# Patient Record
Sex: Female | Born: 1960
Health system: Southern US, Community
[De-identification: ages and names within clinical notes are randomized; demographics above are authoritative.]

## PROBLEM LIST (undated history)

## (undated) DIAGNOSIS — F419 Anxiety disorder, unspecified: Secondary | ICD-10-CM

## (undated) DIAGNOSIS — K219 Gastro-esophageal reflux disease without esophagitis: Secondary | ICD-10-CM

## (undated) DIAGNOSIS — F4010 Social phobia, unspecified: Secondary | ICD-10-CM

## (undated) DIAGNOSIS — K5792 Diverticulitis of intestine, part unspecified, without perforation or abscess without bleeding: Secondary | ICD-10-CM

## (undated) HISTORY — DX: Social phobia, unspecified: F40.10

## (undated) HISTORY — DX: Anxiety disorder, unspecified: F41.9

## (undated) HISTORY — DX: Diverticulitis of intestine, part unspecified, without perforation or abscess without bleeding: K57.92

## (undated) HISTORY — DX: Gastro-esophageal reflux disease without esophagitis: K21.9

## (undated) HISTORY — PX: OTHER SURGICAL HISTORY: SHX169

---

## 1985-10-08 HISTORY — PX: BREAST ENHANCEMENT SURGERY: SHX7

## 1998-06-07 ENCOUNTER — Encounter: Payer: Self-pay | Admitting: Internal Medicine

## 1998-06-07 LAB — CONVERTED CEMR LAB: Pap Smear: NORMAL

## 2002-01-29 ENCOUNTER — Other Ambulatory Visit: Admission: RE | Admit: 2002-01-29 | Discharge: 2002-01-29 | Payer: Self-pay | Admitting: Internal Medicine

## 2002-01-29 ENCOUNTER — Encounter: Payer: Self-pay | Admitting: Internal Medicine

## 2002-01-29 LAB — CONVERTED CEMR LAB: Pap Smear: NORMAL

## 2003-07-09 ENCOUNTER — Other Ambulatory Visit: Admission: RE | Admit: 2003-07-09 | Discharge: 2003-07-09 | Payer: Self-pay | Admitting: Internal Medicine

## 2003-07-09 ENCOUNTER — Encounter: Payer: Self-pay | Admitting: Internal Medicine

## 2004-07-31 ENCOUNTER — Encounter: Payer: Self-pay | Admitting: Internal Medicine

## 2004-09-06 ENCOUNTER — Ambulatory Visit: Payer: Self-pay | Admitting: Internal Medicine

## 2004-09-13 ENCOUNTER — Ambulatory Visit: Payer: Self-pay | Admitting: Internal Medicine

## 2005-02-02 ENCOUNTER — Ambulatory Visit: Payer: Self-pay | Admitting: Family Medicine

## 2005-08-03 ENCOUNTER — Ambulatory Visit: Payer: Self-pay | Admitting: Internal Medicine

## 2005-09-03 ENCOUNTER — Ambulatory Visit: Payer: Self-pay | Admitting: Internal Medicine

## 2005-12-05 ENCOUNTER — Ambulatory Visit: Payer: Self-pay | Admitting: Internal Medicine

## 2005-12-21 ENCOUNTER — Ambulatory Visit: Payer: Self-pay | Admitting: Internal Medicine

## 2006-01-01 ENCOUNTER — Ambulatory Visit: Payer: Self-pay | Admitting: Internal Medicine

## 2006-01-24 ENCOUNTER — Ambulatory Visit: Payer: Self-pay | Admitting: Internal Medicine

## 2006-03-28 ENCOUNTER — Ambulatory Visit: Payer: Self-pay | Admitting: Internal Medicine

## 2006-08-05 ENCOUNTER — Other Ambulatory Visit: Admission: RE | Admit: 2006-08-05 | Discharge: 2006-08-05 | Payer: Self-pay | Admitting: Internal Medicine

## 2006-08-05 ENCOUNTER — Ambulatory Visit: Payer: Self-pay | Admitting: Internal Medicine

## 2006-08-05 LAB — CONVERTED CEMR LAB: Pap Smear: NORMAL

## 2006-09-02 ENCOUNTER — Ambulatory Visit: Payer: Self-pay | Admitting: Internal Medicine

## 2007-01-15 ENCOUNTER — Encounter: Payer: Self-pay | Admitting: Internal Medicine

## 2007-01-15 DIAGNOSIS — J45991 Cough variant asthma: Secondary | ICD-10-CM | POA: Insufficient documentation

## 2007-01-15 DIAGNOSIS — K5730 Diverticulosis of large intestine without perforation or abscess without bleeding: Secondary | ICD-10-CM | POA: Insufficient documentation

## 2007-01-15 DIAGNOSIS — G479 Sleep disorder, unspecified: Secondary | ICD-10-CM | POA: Insufficient documentation

## 2007-01-15 DIAGNOSIS — F411 Generalized anxiety disorder: Secondary | ICD-10-CM | POA: Insufficient documentation

## 2007-01-15 DIAGNOSIS — A6000 Herpesviral infection of urogenital system, unspecified: Secondary | ICD-10-CM | POA: Insufficient documentation

## 2007-01-15 DIAGNOSIS — K219 Gastro-esophageal reflux disease without esophagitis: Secondary | ICD-10-CM | POA: Insufficient documentation

## 2007-02-06 ENCOUNTER — Ambulatory Visit: Payer: Self-pay | Admitting: Internal Medicine

## 2007-02-26 ENCOUNTER — Ambulatory Visit: Payer: Self-pay | Admitting: Internal Medicine

## 2007-03-10 ENCOUNTER — Ambulatory Visit: Payer: Self-pay | Admitting: Internal Medicine

## 2007-04-24 ENCOUNTER — Encounter: Payer: Self-pay | Admitting: Internal Medicine

## 2007-05-09 ENCOUNTER — Telehealth: Payer: Self-pay | Admitting: Internal Medicine

## 2007-05-15 ENCOUNTER — Telehealth (INDEPENDENT_AMBULATORY_CARE_PROVIDER_SITE_OTHER): Payer: Self-pay | Admitting: *Deleted

## 2007-05-19 ENCOUNTER — Ambulatory Visit: Payer: Self-pay | Admitting: Internal Medicine

## 2007-05-19 DIAGNOSIS — R079 Chest pain, unspecified: Secondary | ICD-10-CM | POA: Insufficient documentation

## 2007-05-21 ENCOUNTER — Ambulatory Visit: Payer: Self-pay

## 2007-05-21 ENCOUNTER — Encounter: Payer: Self-pay | Admitting: Internal Medicine

## 2007-05-22 ENCOUNTER — Encounter: Payer: Self-pay | Admitting: Internal Medicine

## 2007-05-25 ENCOUNTER — Emergency Department: Payer: Self-pay | Admitting: Emergency Medicine

## 2007-05-26 ENCOUNTER — Ambulatory Visit: Payer: Self-pay | Admitting: Unknown Physician Specialty

## 2007-05-26 ENCOUNTER — Encounter: Payer: Self-pay | Admitting: Internal Medicine

## 2007-05-26 LAB — HM COLONOSCOPY

## 2007-05-28 ENCOUNTER — Encounter: Payer: Self-pay | Admitting: Internal Medicine

## 2007-06-01 ENCOUNTER — Encounter: Payer: Self-pay | Admitting: Internal Medicine

## 2007-08-07 ENCOUNTER — Telehealth (INDEPENDENT_AMBULATORY_CARE_PROVIDER_SITE_OTHER): Payer: Self-pay | Admitting: *Deleted

## 2007-08-15 ENCOUNTER — Ambulatory Visit: Payer: Self-pay | Admitting: Internal Medicine

## 2007-08-16 ENCOUNTER — Encounter: Payer: Self-pay | Admitting: Internal Medicine

## 2007-08-19 LAB — CONVERTED CEMR LAB
Albumin: 4.2 g/dL (ref 3.5–5.2)
CO2: 25 meq/L (ref 19–32)
Calcium: 8.8 mg/dL (ref 8.4–10.5)
Creatinine, Ser: 0.97 mg/dL (ref 0.40–1.20)
Phosphorus: 4.2 mg/dL (ref 2.3–4.6)
Sodium: 138 meq/L (ref 135–145)

## 2007-08-20 ENCOUNTER — Telehealth: Payer: Self-pay | Admitting: Internal Medicine

## 2007-11-17 ENCOUNTER — Telehealth (INDEPENDENT_AMBULATORY_CARE_PROVIDER_SITE_OTHER): Payer: Self-pay | Admitting: *Deleted

## 2007-12-15 ENCOUNTER — Telehealth (INDEPENDENT_AMBULATORY_CARE_PROVIDER_SITE_OTHER): Payer: Self-pay | Admitting: *Deleted

## 2008-02-23 ENCOUNTER — Telehealth (INDEPENDENT_AMBULATORY_CARE_PROVIDER_SITE_OTHER): Payer: Self-pay | Admitting: *Deleted

## 2008-02-23 ENCOUNTER — Ambulatory Visit: Payer: Self-pay | Admitting: Internal Medicine

## 2008-02-23 DIAGNOSIS — M25559 Pain in unspecified hip: Secondary | ICD-10-CM | POA: Insufficient documentation

## 2008-05-14 ENCOUNTER — Telehealth (INDEPENDENT_AMBULATORY_CARE_PROVIDER_SITE_OTHER): Payer: Self-pay | Admitting: *Deleted

## 2008-05-25 ENCOUNTER — Telehealth (INDEPENDENT_AMBULATORY_CARE_PROVIDER_SITE_OTHER): Payer: Self-pay | Admitting: *Deleted

## 2008-08-23 ENCOUNTER — Telehealth (INDEPENDENT_AMBULATORY_CARE_PROVIDER_SITE_OTHER): Payer: Self-pay | Admitting: *Deleted

## 2008-08-24 ENCOUNTER — Telehealth: Payer: Self-pay | Admitting: Internal Medicine

## 2008-08-27 ENCOUNTER — Ambulatory Visit: Payer: Self-pay | Admitting: Internal Medicine

## 2008-11-18 ENCOUNTER — Encounter (INDEPENDENT_AMBULATORY_CARE_PROVIDER_SITE_OTHER): Payer: Self-pay | Admitting: *Deleted

## 2008-11-22 ENCOUNTER — Telehealth: Payer: Self-pay | Admitting: Internal Medicine

## 2008-12-20 ENCOUNTER — Telehealth: Payer: Self-pay | Admitting: Family Medicine

## 2009-03-25 ENCOUNTER — Ambulatory Visit: Payer: Self-pay | Admitting: Internal Medicine

## 2009-09-13 ENCOUNTER — Ambulatory Visit: Payer: Self-pay | Admitting: Internal Medicine

## 2010-03-16 ENCOUNTER — Ambulatory Visit: Payer: Self-pay | Admitting: Internal Medicine

## 2010-11-07 NOTE — Assessment & Plan Note (Signed)
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 and lots of pressure Works remotely from [employer] home still, then lives in her own home  Had lesion removed from left foot was hemangioma  Still doing well with anxiety Able to go out and do her errands okay still prefers to stay at home  did go to Gyn--not happy with her didn't go back Did find primary care doctor locally got battery of blood work and plans to go back  sleeps well with the temazepam 30mg   not using the 15 mg during the day often  GERD fine on the med   Allergies: 1)  Effexor 2)  Paxil (Paroxetine Hcl) 3)  Prozac (Fluoxetine Hcl)  Past History:  Past medical, surgical, family and social histories (including risk factors) reviewed for relevance to current acute and chronic problems.  Past Medical History: Reviewed history from 01/15/2007 and no changes required. Anxiety Asthma Diverticulosis, colon 1995 GERD  Past Surgical History: Reviewed history from 01/15/2007 and no changes required. C-Sections 1985 and 1987 Breast augmentation 1987 Diverticulitis/ abscess surgery 1995  Family History: Reviewed history from 01/15/2007 and no changes required. Dad died CAD,PVD in 3's Mom has asthma Asthma in 3 siblings DM in [place] No history of cancer  Social History: Reviewed history from 05/19/2007 and no changes required. 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   Psych:  normally interactive, good eye contact, not anxious appearing, and not depressed appearing.     Impression & Recommendations:  Problem # 1:  ANXIETY (ICD-300.00) Assessment Unchanged dong very well has been severe in the past and unable to even go to the store or function well in her job in the past Now doing well on this regimen for years More self assertive--can stand up for herself against her bosses, etc able to live a happy life---even though she prefers to stay home for the most part  P: no changes    she will plan to transfer care to physician in [place]     still occ uses the as needed lower dose temazepam during day (other benzodiazepines didn't work for her)  Her updated medication list for this problem includes:    Buspar 15 Mg Tabs (Buspirone hcl) .Marland Kitchen... 1/2 tab two times a day    Citalopram Hydrobromide 20 Mg Tabs (Citalopram hydrobromide) ..... One by mouth daily  Problem # 2:  SLEEP DISORDER (ICD-780.50) Assessment: Unchanged does well with the temazepam  Problem # 3:  GERD (ICD-530.81) Assessment: Unchanged okay on the med  Her updated medication list for this problem includes:    Omeprazole 20 Mg Cpdr (Omeprazole) .Marland Kitchen... Take 1 tablet by mouth once a day  Complete Medication List: 1)  Buspar 15 Mg Tabs (Buspirone hcl) .... 1/2 tab two times a day 2)  Omeprazole 20 Mg Cpdr (Omeprazole) .... Take 1 tablet by mouth once a day 3)  Valtrex 1 Gm Tabs (Valacyclovir hcl) .... Take 1 tab two times a day as needed for outbreaks. 4)  Citalopram Hydrobromide 20 Mg Tabs (Citalopram hydrobromide) .... One by mouth daily 5)  Temazepam 15 Mg Caps (Temazepam) .Marland Kitchen.. 1 two times a day as needed for nerves 6)  Temazepam 30 Mg Caps (Temazepam) .Marland Kitchen.. 1 at bedtime to help sleep  Patient Instructions: 1)  Please schedule a follow-up  appointment as needed .  Prescriptions: TEMAZEPAM 30 MG CAPS (TEMAZEPAM) 1 at bedtime to help sleep  #30 x 5   Entered by:   Mervin Hack CMA (AAMA)   Authorized by:   Cindee Salt MD   Signed by:   Mervin Hack CMA (AAMA) on 03/16/2010   Method used:   Print then Give to Patient   RxID:   [id number] TEMAZEPAM 15 MG CAPS (TEMAZEPAM) 1 two times a day as needed for nerves  #60 x 3   Entered by:   Mervin Hack CMA (AAMA)   Authorized by:   Cindee Salt MD   Signed by:   Mervin Hack CMA (AAMA) on 03/16/2010   Method used:   Print then Give to Patient   RxID:   [id number] CITALOPRAM HYDROBROMIDE 20 MG  TABS (CITALOPRAM HYDROBROMIDE) one by mouth daily  #30 x 12   Entered by:   Mervin Hack CMA (AAMA)   Authorized by:   Cindee Salt MD   Signed by:   Mervin Hack CMA (AAMA) on 03/16/2010   Method used:   Print then Give to Patient   RxID:   [id number] OMEPRAZOLE 20 MG  CPDR (OMEPRAZOLE) Take 1 tablet by mouth once a day  #30 x 12   Entered by:   Mervin Hack CMA (AAMA)   Authorized by:   Cindee Salt MD   Signed by:   Mervin Hack CMA (AAMA) on 03/16/2010   Method used:   Print then Give to Patient   RxID:   [id number] BUSPAR 15 MG TABS (BUSPIRONE HCL) 1/2 tab two times a day  #30 x 12   Entered by:   Mervin Hack CMA (AAMA)   Authorized by:   Cindee Salt MD   Signed by:   Mervin Hack CMA (AAMA) on 03/16/2010   Method used:   Print then Give to Patient   RxID:   [id number]   Current Allergies (reviewed today): EFFEXOR PAXIL (PAROXETINE HCL) PROZAC (FLUOXETINE HCL)

## 2010-12-25 ENCOUNTER — Telehealth: Payer: Self-pay | Admitting: Internal Medicine

## 2011-01-04 NOTE — Progress Notes (Signed)
Summary: valtrex  Phone Note Call from Patient Call back at Home Phone [phone]   Caller: Patient Call For: Cindee Salt MD Summary of Call: Patient is now living in [place], but says that Dr. Alphonsus Sias is still her Dr., she says that she has some cold sores and is asking if she can get rx for valtrex [facility] [address] [facility] [place].  Initial call taken by: Melody Comas,  December 25, 2010 3:08 PM  Follow-up for Phone Call        okay  valacyclovir 1gm 2 at onset of cold sores and 2 tabs 12 hours later #28 x 0 Follow-up by: Cindee Salt MD,  December 25, 2010 4:24 PM  Additional Follow-up for Phone Call Additional follow up Details #1::        [id number] Additional Follow-up by: Mervin Hack CMA Duncan Dull),  December 25, 2010 5:40 PM    New/Updated Medications: VALTREX 1 GM  TABS (VALACYCLOVIR HCL) 2 at onset of cold sores and 2 tabs 12 hours later Prescriptions: VALTREX 1 GM  TABS (VALACYCLOVIR HCL) 2 at onset of cold sores and 2 tabs 12 hours later  #28 x 0   Entered by:   Mervin Hack CMA (AAMA)   Authorized by:   Cindee Salt MD   Signed by:   Mervin Hack CMA (AAMA) on 12/25/2010   Method used:   Historical   RxID:   [id number] VALTREX 1 GM  TABS (VALACYCLOVIR HCL) 2 at onset of cold sores and 2 tabs 12 hours later  #20 x 0   Entered by:   Mervin Hack CMA (AAMA)   Authorized by:   Cindee Salt MD   Signed by:   Mervin Hack CMA (AAMA) on 12/25/2010   Method used:   Telephoned to ...       [facility] [address]* (retail)       [address]       [place], [place]  [address]       Ph: [phone]       Fax: [phone]   RxID:   [id number]

## 2011-04-10 ENCOUNTER — Encounter: Payer: Self-pay | Admitting: Internal Medicine

## 2011-04-12 ENCOUNTER — Ambulatory Visit (INDEPENDENT_AMBULATORY_CARE_PROVIDER_SITE_OTHER): Payer: Managed Care, Other (non HMO) | Admitting: Internal Medicine

## 2011-04-12 ENCOUNTER — Encounter: Payer: Self-pay | Admitting: Internal Medicine

## 2011-04-12 VITALS — BP 110/70 | HR 66 | Temp 98.3°F | Ht 65.0 in | Wt 135.0 lb

## 2011-04-12 DIAGNOSIS — F411 Generalized anxiety disorder: Secondary | ICD-10-CM

## 2011-04-12 MED ORDER — CITALOPRAM HYDROBROMIDE 20 MG PO TABS: 20.0000 mg | ORAL_TABLET | Freq: Every day | ORAL | Status: DC

## 2011-04-12 NOTE — Progress Notes (Signed)
Subjective:    Patient ID: Rachel Vaughn, female    DOB: 12-Mar-1961, 50 y.o.   MRN: [id number]  HPI Visiting from [place] daughter with event for work--she lives in [place]  Did establish with primary care doctor --had appt but couldn't go after seeing her twice Starting get very anxious She decided to cancel it She changed doses of citalopram and buspirone  Mom died 2 months ago Had to move her over to her house to care for her This is a relief on some levels because she had been suffering with her COPD Had some second thoughts---sent to sisters and they didn't push her much and she became more sedentary and died not long after Hospice was involved at the end  Still working with [facility] May work up to 12 hours per day Lonely there Plans to move back --her son and daughter are up here Couldn't connect with brother and sister down there  Has been more depressed Isolated and feels she needs to be more needed Needs more socialization  Has had some back pain after a fall in her closet a few months ago ER visit showed nothing broken Has needed hydrocodone about once a day since then Discussed working on walking and better condition  Current Outpatient Prescriptions on File Prior to Visit  Medication Sig Dispense Refill  . omeprazole (PRILOSEC) 20 MG capsule Take 20 mg by mouth daily.        . temazepam (RESTORIL) 15 MG capsule Take 15 mg by mouth 2 (two) times daily as needed.        . temazepam (RESTORIL) 30 MG capsule Take 30 mg by mouth at bedtime as needed.        . valACYclovir (VALTREX) 1000 MG tablet Take 1,000 mg by mouth 2 (two) times daily.        Marland Kitchen DISCONTD: busPIRone (BUSPAR) 15 MG tablet Take 7.5 mg by mouth 2 (two) times daily.        Marland Kitchen DISCONTD: citalopram (CELEXA) 20 MG tablet Take 20 mg by mouth daily.          Allergies  Allergen Reactions  . Fluoxetine Hcl     REACTION: unspecified  . Paroxetine     REACTION: unspecified  . Venlafaxine    REACTION: unspecified    Past Medical History  Diagnosis Date  . Diverticulitis   . Anxiety   . GERD (gastroesophageal reflux disease)   . Asthma     Past Surgical History  Procedure Date  . Breast enhancement surgery 1987  . Cesarean section     Family History  Problem Relation Age of Onset  . Asthma Mother   . COPD Mother   . Diabetes Maternal Grandmother   . Cancer Neg Hx     History   Social History  . Marital Status: Married    Spouse Name: N/A    Number of Children: 2  . Years of Education: N/A   Occupational History  . 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 Smoking status: Never Smoker   . Smokeless tobacco: Never Used  . Alcohol Use: Yes  . Drug Use: No  . Sexually Active: Not on file   Other Topics Concern  . Not on file   Social History Narrative  . No narrative on file   Review of Systems      Objective:   Physical Exam  Psychiatric:       Mildly anxious but  not more than usual No overt depression now Normal or slightly pressured speech          Assessment & Plan:

## 2011-04-12 NOTE — Assessment & Plan Note (Signed)
Has worsened with stress with mom's care and then death Some secondary depression due to isolation, etc Has decided to move back up here---this may help her Will increase the citalopram back to 20mg  Stay on slightly higher dose of buspar restoril during day and for sleep counselled more than half of 30 minute visit

## 2011-05-07 ENCOUNTER — Other Ambulatory Visit: Payer: Self-pay | Admitting: Internal Medicine

## 2011-05-08 ENCOUNTER — Other Ambulatory Visit: Payer: Self-pay | Admitting: *Deleted

## 2011-05-08 MED ORDER — TEMAZEPAM 30 MG PO CAPS: 30.0000 mg | ORAL_CAPSULE | Freq: Every evening | ORAL | Status: DC | PRN

## 2011-05-08 MED ORDER — TEMAZEPAM 15 MG PO CAPS: 15.0000 mg | ORAL_CAPSULE | Freq: Two times a day (BID) | ORAL | Status: DC | PRN

## 2011-05-08 MED ORDER — HYDROCODONE-ACETAMINOPHEN 7.5-650 MG PO TABS: 1.0000 | ORAL_TABLET | Freq: Every day | ORAL | Status: DC | PRN

## 2011-05-08 MED ORDER — OMEPRAZOLE 20 MG PO CPDR: 20.0000 mg | DELAYED_RELEASE_CAPSULE | Freq: Every day | ORAL | Status: DC

## 2011-05-08 MED ORDER — VALACYCLOVIR HCL 1 G PO TABS: 1000.0000 mg | ORAL_TABLET | Freq: Two times a day (BID) | ORAL | Status: DC

## 2011-05-08 NOTE — Telephone Encounter (Signed)
rx sent to pharmacy by e-script  

## 2011-05-08 NOTE — Telephone Encounter (Signed)
Rachel Vaughn 

## 2011-05-08 NOTE — Telephone Encounter (Signed)
All prescriptions were called in, Temazepam & Hydrocodone.

## 2011-06-04 ENCOUNTER — Other Ambulatory Visit: Payer: Self-pay | Admitting: *Deleted

## 2011-06-04 MED ORDER — TEMAZEPAM 30 MG PO CAPS: 30.0000 mg | ORAL_CAPSULE | Freq: Every evening | ORAL | Status: DC | PRN

## 2011-06-04 MED ORDER — BUSPIRONE HCL 10 MG PO TABS: 10.0000 mg | ORAL_TABLET | Freq: Two times a day (BID) | ORAL | Status: DC

## 2011-06-04 NOTE — Telephone Encounter (Signed)
rx faxed to pharmacy manually for temazepam,  rx sent to pharmacy by e-script for buspirone

## 2011-06-04 NOTE — Telephone Encounter (Signed)
Buspirone approved for 1 year Please call both in

## 2011-06-04 NOTE — Telephone Encounter (Signed)
Okay #30 x 5 

## 2011-06-04 NOTE — Telephone Encounter (Signed)
Last refill 05/08/2011, Rx in your IN box.

## 2011-06-15 ENCOUNTER — Encounter: Payer: Self-pay | Admitting: Internal Medicine

## 2011-06-15 ENCOUNTER — Ambulatory Visit (INDEPENDENT_AMBULATORY_CARE_PROVIDER_SITE_OTHER): Payer: Managed Care, Other (non HMO) | Admitting: Internal Medicine

## 2011-06-15 VITALS — BP 114/72 | HR 68 | Temp 98.2°F | Ht 65.0 in | Wt 137.5 lb

## 2011-06-15 DIAGNOSIS — F411 Generalized anxiety disorder: Secondary | ICD-10-CM

## 2011-06-15 NOTE — Progress Notes (Signed)
Subjective:    Patient ID: Rachel Vaughn, female    DOB: [birth year], 50 y.o.   MRN: [id number]  HPI Moved back up to [place] Now in [place] both houses in [place] Very stressful but got through it  Now with stress at work Has spent time there for training sessions So stressed out that she is thinking of giving her notice Really wants to be part time but they refused They want her to commute and work there again  Now ready to address her issues---done with caregiving for parents, kids are independent Wonders about counselling finally  Overwhelmed Getting chest tightness, anxiety firing up Got overemotional in the office there  Has needed the daytime temazepam due to stress--but that can put her into a fog  Still doing okay with shopping, etc Can get out without panicking for the most part Still has some shame from isolating herself Does still nap in day but trying to avoid this Trying to go out and walk regularly  Current Outpatient Prescriptions on File Prior to Visit  Medication Sig Dispense Refill  . busPIRone (BUSPAR) 10 MG tablet Take 1 tablet (10 mg total) by mouth 2 (two) times daily.  60 tablet  11  . citalopram (CELEXA) 20 MG tablet Take 1 tablet (20 mg total) by mouth daily.  90 tablet  3  . hydrocodone-acetaminophen (LORCET PLUS) 7.5-650 MG per tablet Take 1-2 tablets by mouth daily as needed.  60 tablet  0  . omeprazole (PRILOSEC) 20 MG capsule Take 1 capsule (20 mg total) by mouth daily.  30 capsule  11  . temazepam (RESTORIL) 15 MG capsule Take 1 capsule (15 mg total) by mouth 2 (two) times daily as needed.  60 capsule  0  . temazepam (RESTORIL) 30 MG capsule Take 1 capsule (30 mg total) by mouth at bedtime as needed.  30 capsule  5  . valACYclovir (VALTREX) 1000 MG tablet Take 1 tablet (1,000 mg total) by mouth 2 (two) times daily.  60 tablet  0    Allergies  Allergen Reactions  . Fluoxetine Hcl     REACTION: unspecified  . Paroxetine    REACTION: unspecified  . Venlafaxine     REACTION: unspecified    Past Medical History  Diagnosis Date  . Diverticulitis   . Anxiety   . GERD (gastroesophageal reflux disease)   . Asthma     Past Surgical History  Procedure Date  . Breast enhancement surgery 1987  . Cesarean section     Family History  Problem Relation Age of Onset  . Asthma Mother   . COPD Mother   . Diabetes Maternal Grandmother   . Cancer Neg Hx     History   Social History  . Marital Status: Married    Spouse Name: N/A    Number of Children: 2  . Years of Education: N/A   Occupational History  . 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 Smoking status: Never Smoker   . Smokeless tobacco: Never Used  . Alcohol Use: Yes  . Drug Use: No  . Sexually Active: Not on file   Other Topics Concern  . Not on file   Social History Narrative  . No narrative on file   Review of Systems Eating okay Is able to sleep at night with the temazepam    Objective:   Physical Exam  Constitutional: She appears well-developed and well-nourished. No distress.  Psychiatric:  Moderate anxiety Doesn't appear depressed No thought process disturbances          Assessment & Plan:

## 2011-06-15 NOTE — Assessment & Plan Note (Signed)
Marked exacerbation  Lots of stress with recent death of mom and then moving back here May need short term disability if work will not keep her from mandatory overtime and the stress of in office work when she has done fine working from home counselled most of 25 minute visit

## 2011-06-15 NOTE — Patient Instructions (Signed)
Please set up the appointment with the psychologist We may need to consider a short term disability due to your severe anxiety issues if your work requirements include mandatory overtime or required full time presence in the office when you have worked fine remotely for the past 2 years

## 2011-06-18 ENCOUNTER — Encounter: Payer: Self-pay | Admitting: Internal Medicine

## 2011-06-18 DIAGNOSIS — Z0279 Encounter for issue of other medical certificate: Secondary | ICD-10-CM

## 2011-06-27 ENCOUNTER — Ambulatory Visit (INDEPENDENT_AMBULATORY_CARE_PROVIDER_SITE_OTHER): Payer: Managed Care, Other (non HMO) | Admitting: Psychology

## 2011-06-27 DIAGNOSIS — F411 Generalized anxiety disorder: Secondary | ICD-10-CM

## 2011-07-03 ENCOUNTER — Ambulatory Visit (INDEPENDENT_AMBULATORY_CARE_PROVIDER_SITE_OTHER): Payer: Managed Care, Other (non HMO) | Admitting: Psychology

## 2011-07-03 DIAGNOSIS — F411 Generalized anxiety disorder: Secondary | ICD-10-CM

## 2011-07-05 ENCOUNTER — Other Ambulatory Visit: Payer: Self-pay | Admitting: *Deleted

## 2011-07-05 MED ORDER — HYDROCODONE-ACETAMINOPHEN 7.5-650 MG PO TABS: 1.0000 | ORAL_TABLET | Freq: Every day | ORAL | Status: DC | PRN

## 2011-07-05 NOTE — Telephone Encounter (Signed)
Okay #60 x 0 

## 2011-07-05 NOTE — Telephone Encounter (Signed)
rx called into pharmacy

## 2011-07-05 NOTE — Telephone Encounter (Signed)
Pt is [facility] that [facility] [place]

## 2011-07-06 ENCOUNTER — Encounter: Payer: Self-pay | Admitting: Internal Medicine

## 2011-07-06 ENCOUNTER — Ambulatory Visit (INDEPENDENT_AMBULATORY_CARE_PROVIDER_SITE_OTHER): Payer: Managed Care, Other (non HMO) | Admitting: Internal Medicine

## 2011-07-06 VITALS — BP 119/69 | HR 56 | Temp 97.8°F | Ht 65.0 in | Wt 138.5 lb

## 2011-07-06 DIAGNOSIS — Z23 Encounter for immunization: Secondary | ICD-10-CM

## 2011-07-06 DIAGNOSIS — F411 Generalized anxiety disorder: Secondary | ICD-10-CM

## 2011-07-06 NOTE — Progress Notes (Signed)
Subjective:    Patient ID: Rachel Vaughn, female    DOB: 1961/01/25, 50 y.o.   MRN: [id number]  HPI Went right to her work office after last visit They wanted her to go on the short term disability  Has licker herself in her house again for the most part Able to briefly go to grocery store (walking distance) Actually finds it helpful for now that she doesn't know anyone---less chance of embarrassment  Interested in support groups if available  Has seen Dr Laymond Purser May look for therapist closer to her home in [place]  Still couldn't bear the thought of working in office 48 hours per week  Some hot flashes and nausea Hypermenorrhea Made appt with gynecologist  Trying to limit daytime temazepam--is sedating This would be the only way to even have a chance of working in the office Only using occ now  Current Outpatient Prescriptions on File Prior to Visit  Medication Sig Dispense Refill  . busPIRone (BUSPAR) 10 MG tablet Take 1 tablet (10 mg total) by mouth 2 (two) times daily.  60 tablet  11  . citalopram (CELEXA) 20 MG tablet Take 1 tablet (20 mg total) by mouth daily.  90 tablet  3  . hydrocodone-acetaminophen (LORCET PLUS) 7.5-650 MG per tablet Take 1-2 tablets by mouth daily as needed.  60 tablet  0  . omeprazole (PRILOSEC) 20 MG capsule Take 1 capsule (20 mg total) by mouth daily.  30 capsule  11  . temazepam (RESTORIL) 15 MG capsule Take 1 capsule (15 mg total) by mouth 2 (two) times daily as needed.  60 capsule  0  . temazepam (RESTORIL) 30 MG capsule Take 1 capsule (30 mg total) by mouth at bedtime as needed.  30 capsule  5  . valACYclovir (VALTREX) 1000 MG tablet Take 1 tablet (1,000 mg total) by mouth 2 (two) times daily.  60 tablet  0    Allergies  Allergen Reactions  . Fluoxetine Hcl     REACTION: unspecified  . Paroxetine     REACTION: unspecified  . Venlafaxine     REACTION: unspecified    Past Medical History  Diagnosis Date  . Diverticulitis   . Anxiety     . GERD (gastroesophageal reflux disease)   . Asthma   . Social anxiety disorder     Past Surgical History  Procedure Date  . Breast enhancement surgery 1987  . Cesarean section     Family History  Problem Relation Age of Onset  . Asthma Mother   . COPD Mother   . Diabetes Maternal Grandmother   . Cancer Neg Hx     History   Social History  . Marital Status: Married    Spouse Name: N/A    Number of Children: 2  . Years of Education: N/A   Occupational History  . 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 Smoking status: Never Smoker   . Smokeless tobacco: Never Used  . Alcohol Use: Yes  . Drug Use: No  . Sexually Active: Not on file   Other Topics Concern  . Not on file   Social History Narrative  . No narrative on file   Review of Systems Sleeping fair with the temazepam---has been napping in afternoon (fatigue &/or boredom) Appetite is okay     Objective:   Physical Exam  Psychiatric:       Still anxious Not depressed  Normal interaction No thought process disturbance  Assessment & Plan:

## 2011-07-06 NOTE — Assessment & Plan Note (Signed)
Ongoing issues Severe social anxiety disorder Okay to work remotely from home---not able to work in the office still  Stress with loss of dad, moving back to [place], etc Has really affected her resilience

## 2011-07-11 ENCOUNTER — Ambulatory Visit (INDEPENDENT_AMBULATORY_CARE_PROVIDER_SITE_OTHER): Payer: Managed Care, Other (non HMO) | Admitting: Psychology

## 2011-07-11 DIAGNOSIS — F411 Generalized anxiety disorder: Secondary | ICD-10-CM

## 2011-07-30 ENCOUNTER — Encounter: Payer: Self-pay | Admitting: Internal Medicine

## 2011-07-30 ENCOUNTER — Ambulatory Visit (INDEPENDENT_AMBULATORY_CARE_PROVIDER_SITE_OTHER): Payer: Managed Care, Other (non HMO) | Admitting: Internal Medicine

## 2011-07-30 DIAGNOSIS — F411 Generalized anxiety disorder: Secondary | ICD-10-CM

## 2011-07-30 NOTE — Progress Notes (Signed)
  Subjective:    Patient ID: Rachel Vaughn, female    DOB: 08-28-1961, 50 y.o.   MRN: [id number]  HPI Doing okay Has started walking--gets her out of the house Has started a yoga class also---very hard at first Now able to breath better  Still seeing Dr Laymond Purser Has appt in 2 days  "nightmare" at work Hadn't heard from insurance company about her disability Now found out her claim has been denied because she can work from home  Does have the feeling that she is finally "standing up for myself"  Current Outpatient Prescriptions on File Prior to Visit  Medication Sig Dispense Refill  . busPIRone (BUSPAR) 10 MG tablet Take 1 tablet (10 mg total) by mouth 2 (two) times daily.  60 tablet  11  . citalopram (CELEXA) 20 MG tablet Take 1 tablet (20 mg total) by mouth daily.  90 tablet  3  . hydrocodone-acetaminophen (LORCET PLUS) 7.5-650 MG per tablet Take 1-2 tablets by mouth daily as needed.  60 tablet  0  . omeprazole (PRILOSEC) 20 MG capsule Take 1 capsule (20 mg total) by mouth daily.  30 capsule  11  . temazepam (RESTORIL) 15 MG capsule Take 1 capsule (15 mg total) by mouth 2 (two) times daily as needed.  60 capsule  0  . temazepam (RESTORIL) 30 MG capsule Take 1 capsule (30 mg total) by mouth at bedtime as needed.  30 capsule  5  . valACYclovir (VALTREX) 1000 MG tablet Take 1 tablet (1,000 mg total) by mouth 2 (two) times daily.  60 tablet  0    Allergies  Allergen Reactions  . Fluoxetine Hcl     REACTION: unspecified  . Paroxetine     REACTION: unspecified  . Venlafaxine     REACTION: unspecified    Past Medical History  Diagnosis Date  . Diverticulitis   . Anxiety   . GERD (gastroesophageal reflux disease)   . Asthma   . Social anxiety disorder     Past Surgical History  Procedure Date  . Breast enhancement surgery 1987  . Cesarean section     Family History  Problem Relation Age of Onset  . Asthma Mother   . COPD Mother   . Diabetes Maternal Grandmother   .  Cancer Neg Hx     History   Social History  . Marital Status: Married    Spouse Name: N/A    Number of Children: 2  . Years of Education: N/A   Occupational History  . 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 Smoking status: Never Smoker   . Smokeless tobacco: Never Used  . Alcohol Use: Yes  . Drug Use: No  . Sexually Active: Not on file   Other Topics Concern  . Not on file   Social History Narrative  . No narrative on file   Review of Systems Sleeping okay--just occ bad dreams Back is better with her increased exercise Appetite is fine     Objective:   Physical Exam  Constitutional: She appears well-developed and well-nourished. No distress.  Psychiatric: She has a normal mood and affect. Her behavior is normal. Judgment and thought content normal.          Assessment & Plan:

## 2011-07-30 NOTE — Assessment & Plan Note (Signed)
Doing better now Expects to be allowed to work again remotely May need legal help because she was put out improperly when she was willing to work No changes needed in Rx

## 2011-08-01 ENCOUNTER — Ambulatory Visit (INDEPENDENT_AMBULATORY_CARE_PROVIDER_SITE_OTHER): Payer: Managed Care, Other (non HMO) | Admitting: Psychology

## 2011-08-01 DIAGNOSIS — F411 Generalized anxiety disorder: Secondary | ICD-10-CM

## 2011-08-09 ENCOUNTER — Telehealth: Payer: Self-pay | Admitting: Internal Medicine

## 2011-08-09 NOTE — Telephone Encounter (Signed)
Pt called, says she is ready to go back to work, says her employer is willing to work w/ her. Mrs Kamps would like for you to write a letter releasing her back to work at no more than  34 hrs per  weekly. Says her employer needs to know how many hrs she can work. Pts call back # [phone].Apolinar Junes 08-09-2011

## 2011-08-15 ENCOUNTER — Telehealth: Payer: Self-pay | Admitting: *Deleted

## 2011-08-15 ENCOUNTER — Encounter: Payer: Self-pay | Admitting: Internal Medicine

## 2011-08-15 ENCOUNTER — Ambulatory Visit (INDEPENDENT_AMBULATORY_CARE_PROVIDER_SITE_OTHER): Payer: Managed Care, Other (non HMO) | Admitting: Internal Medicine

## 2011-08-15 ENCOUNTER — Ambulatory Visit: Payer: Managed Care, Other (non HMO) | Admitting: Psychology

## 2011-08-15 DIAGNOSIS — F411 Generalized anxiety disorder: Secondary | ICD-10-CM

## 2011-08-15 NOTE — Telephone Encounter (Signed)
Pt has faxed disability forms, these are on your desk.

## 2011-08-15 NOTE — Progress Notes (Signed)
  Subjective:    Patient ID: Rachel Vaughn, female    DOB: Sep 24, 1961, 50 y.o.   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       Anxious Visibly upset Clear autonomic hyperfunction---sweaty palms, etc  Psychiatric:       Very anxious Psychomotor agitation          Assessment & Plan:

## 2011-08-15 NOTE — Assessment & Plan Note (Signed)
Much worse Really has reached the end of the road with this job Clearly is disabled from working this job due to her anxiety, but this may not be the best road to follow now She is ready to go discuss a more amicable dissolution of her long term employment there She has consulted with attorney---if she can't come to an agreement at work, she probably needs to use the services of the attorney Also could go on FMLA if the delay might help--but it seems that her going back to this job is not going to be possible  counselled all of 25 minute visit

## 2011-08-16 NOTE — Telephone Encounter (Signed)
Form faxed to [employer] 

## 2011-08-16 NOTE — Telephone Encounter (Signed)
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

## 2011-08-16 NOTE — Telephone Encounter (Signed)
This is an old note Not sure why I got it now  No longer appropriate to her situation

## 2011-08-22 ENCOUNTER — Ambulatory Visit (INDEPENDENT_AMBULATORY_CARE_PROVIDER_SITE_OTHER): Payer: Managed Care, Other (non HMO) | Admitting: Psychology

## 2011-08-22 DIAGNOSIS — F411 Generalized anxiety disorder: Secondary | ICD-10-CM

## 2011-09-05 ENCOUNTER — Ambulatory Visit: Payer: Managed Care, Other (non HMO) | Admitting: Psychology

## 2011-09-05 ENCOUNTER — Telehealth: Payer: Self-pay | Admitting: *Deleted

## 2011-09-05 NOTE — Telephone Encounter (Signed)
Opened in error

## 2011-09-07 ENCOUNTER — Ambulatory Visit (INDEPENDENT_AMBULATORY_CARE_PROVIDER_SITE_OTHER): Payer: Managed Care, Other (non HMO) | Admitting: Internal Medicine

## 2011-09-07 ENCOUNTER — Encounter: Payer: Self-pay | Admitting: Internal Medicine

## 2011-09-07 DIAGNOSIS — M5432 Sciatica, left side: Secondary | ICD-10-CM | POA: Insufficient documentation

## 2011-09-07 DIAGNOSIS — M5430 Sciatica, unspecified side: Secondary | ICD-10-CM

## 2011-09-07 NOTE — Assessment & Plan Note (Signed)
No signs of central nerve compression Much better with conservative Rx Discussed proper bending, lifting, etc Can continue the heat

## 2011-09-07 NOTE — Progress Notes (Signed)
Subjective:    Patient ID: Rachel Vaughn, female    DOB: 1960/10/11, 50 y.o.   MRN: [id number]  HPI Seen in [facility] ER 11/27-- "I was down, I just couldn't move" Has had ongoing right back pain occ on left as well Was planning to go to yoga that night---was doing laundry and went down Had to lie flat on back on bed (luckily she was right by her bed) Severe pain in left paraspinal lumbar area and down her leg (shooting under hip and down leg) No leg weakness  Kids drove her to ER X-ray of back done. No CT. ??degenerative disc disease Diagnosed with sciatica Given oxycodone--helps. Has limited use (none yet today)  Considerably better now Still with localized low left back pain Using heating pad regularly  Current Outpatient Prescriptions on File Prior to Visit  Medication Sig Dispense Refill  . busPIRone (BUSPAR) 10 MG tablet Take 1 tablet (10 mg total) by mouth 2 (two) times daily.  60 tablet  11  . citalopram (CELEXA) 20 MG tablet Take 1 tablet (20 mg total) by mouth daily.  90 tablet  3  . hydrocodone-acetaminophen (LORCET PLUS) 7.5-650 MG per tablet Take 1-2 tablets by mouth daily as needed.  60 tablet  0  . omeprazole (PRILOSEC) 20 MG capsule Take 1 capsule (20 mg total) by mouth daily.  30 capsule  11  . temazepam (RESTORIL) 15 MG capsule Take 1 capsule (15 mg total) by mouth 2 (two) times daily as needed.  60 capsule  0  . temazepam (RESTORIL) 30 MG capsule Take 1 capsule (30 mg total) by mouth at bedtime as needed.  30 capsule  5  . valACYclovir (VALTREX) 1000 MG tablet Take 1 tablet (1,000 mg total) by mouth 2 (two) times daily.  60 tablet  0    Allergies  Allergen Reactions  . Fluoxetine Hcl     REACTION: unspecified  . Paroxetine     REACTION: unspecified  . Venlafaxine     REACTION: unspecified    Past Medical History  Diagnosis Date  . Diverticulitis   . Anxiety   . GERD (gastroesophageal reflux disease)   . Asthma   . Social anxiety disorder     Past  Surgical History  Procedure Date  . Breast enhancement surgery 1987  . Cesarean section     Family History  Problem Relation Age of Onset  . Asthma Mother   . COPD Mother   . Diabetes Maternal Grandmother   . Cancer Neg Hx     History   Social History  . Marital Status: Married    Spouse Name: N/A    Number of Children: 2  . Years of Education: N/A   Occupational History  . 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 Smoking status: Never Smoker   . Smokeless tobacco: Never Used  . Alcohol Use: Yes  . Drug Use: No  . Sexually Active: Not on file   Other Topics Concern  . Not on file   Social History Narrative  . No narrative on file     Review of Systems No change in bowel or bladder function No incontinence    Objective:   Physical Exam  Constitutional: She appears well-developed and well-nourished. No distress.  Musculoskeletal:       Mild tenderness along spine but not focal Main pain area in upper left lumbar paraspinals Back flexion to 90 degrees Normal passive ROM of hips Mild  pain on left with maximal SLR (only ipsilateral)  Neurological:       No leg weakness          Assessment & Plan:

## 2011-09-19 ENCOUNTER — Ambulatory Visit (INDEPENDENT_AMBULATORY_CARE_PROVIDER_SITE_OTHER): Payer: Managed Care, Other (non HMO) | Admitting: Psychology

## 2011-09-19 DIAGNOSIS — F411 Generalized anxiety disorder: Secondary | ICD-10-CM

## 2011-09-24 ENCOUNTER — Encounter: Payer: Self-pay | Admitting: Internal Medicine

## 2011-09-24 ENCOUNTER — Ambulatory Visit (INDEPENDENT_AMBULATORY_CARE_PROVIDER_SITE_OTHER): Payer: Managed Care, Other (non HMO) | Admitting: Internal Medicine

## 2011-09-24 DIAGNOSIS — F411 Generalized anxiety disorder: Secondary | ICD-10-CM

## 2011-09-24 NOTE — Progress Notes (Signed)
Subjective:    Patient ID: Rachel Vaughn, female    DOB: 1961-02-16, 50 y.o.   MRN: [id number]  HPI Disability claim has been approved through December 19th  Continues to see Dr Laymond Purser  Anxiety is ongoing As always, she notes that her major stress was work and that even working from home was no longer tolerable The workplace environment has been difficult for years The anticipation of work has caused severe physical symptoms and panic Feels overwhelmed with the demands of the job--new computer programs, products, etc. No sufficient inservice on this stuff for her  No further information from company or HR about her status  Has been spending most of her time in her apartment Still able to get out to do her shopping---limits herself to once a week and keeps reserve in house in case she has to abort the trip to store Does try to go out on walking trail at times---but limits the distance so she can rush back to the apt if she needs to  Is considering volunteer work to try low pressure social interaction again Not sure about any employment now---certainly volunteering may be a good first step  Current Outpatient Prescriptions on File Prior to Visit  Medication Sig Dispense Refill  . busPIRone (BUSPAR) 10 MG tablet Take 1 tablet (10 mg total) by mouth 2 (two) times daily.  60 tablet  11  . citalopram (CELEXA) 20 MG tablet Take 1 tablet (20 mg total) by mouth daily.  90 tablet  3  . omeprazole (PRILOSEC) 20 MG capsule Take 1 capsule (20 mg total) by mouth daily.  30 capsule  11  . temazepam (RESTORIL) 15 MG capsule Take 1 capsule (15 mg total) by mouth 2 (two) times daily as needed.  60 capsule  0  . temazepam (RESTORIL) 30 MG capsule Take 1 capsule (30 mg total) by mouth at bedtime as needed.  30 capsule  5  . valACYclovir (VALTREX) 1000 MG tablet Take 1 tablet (1,000 mg total) by mouth 2 (two) times daily.  60 tablet  0  . hydrocodone-acetaminophen (LORCET PLUS) 7.5-650 MG per tablet Take  1-2 tablets by mouth daily as needed.  60 tablet  0    Allergies  Allergen Reactions  . Fluoxetine Hcl     REACTION: unspecified  . Paroxetine     REACTION: unspecified  . Venlafaxine     REACTION: unspecified    Past Medical History  Diagnosis Date  . Diverticulitis   . Anxiety   . GERD (gastroesophageal reflux disease)   . Asthma   . Social anxiety disorder     Past Surgical History  Procedure Date  . Breast enhancement surgery 1987  . Cesarean section     Family History  Problem Relation Age of Onset  . Asthma Mother   . COPD Mother   . Diabetes Maternal Grandmother   . Cancer Neg Hx     History   Social History  . Marital Status: Married    Spouse Name: N/A    Number of Children: 2  . Years of Education: N/A   Occupational History  . 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 Smoking status: Never Smoker   . Smokeless tobacco: Never Used  . Alcohol Use: Yes  . Drug Use: No  . Sexually Active: Not on file   Other Topics Concern  . Not on file   Social History Narrative  . No narrative on file  Review of Systems Back pain is better Sleeping fairly well with sleeping pill Appetite is fine Weight stable Does get recurrent loose stools---thinks it may be nerves Just had pap smear Last mammo was 2-3 years ago    Objective:   Physical Exam  Constitutional: She appears well-developed and well-nourished. No distress.  Psychiatric: Her behavior is normal. Judgment and thought content normal.       Mild ongoing anxiety Not depressed          Assessment & Plan:

## 2011-09-24 NOTE — Assessment & Plan Note (Signed)
Ongoing anxiety Still has trouble leaving her apartment The fear of her job is still severe and she is not close to being ready to consider any work right now I endorse the idea of trying to volunteer--this may be the first step to her being employable again I doubt there is any chance she can ever go back to her past job For now, she will continue with the psychologist Still disabled and unable to work for at least the next 3 months Will reevaluate then

## 2011-09-24 NOTE — Patient Instructions (Signed)
Please cancel the January appt

## 2011-09-25 ENCOUNTER — Ambulatory Visit (INDEPENDENT_AMBULATORY_CARE_PROVIDER_SITE_OTHER): Payer: Managed Care, Other (non HMO) | Admitting: Psychology

## 2011-09-25 DIAGNOSIS — F411 Generalized anxiety disorder: Secondary | ICD-10-CM

## 2011-09-28 ENCOUNTER — Other Ambulatory Visit: Payer: Self-pay | Admitting: Internal Medicine

## 2011-09-28 MED ORDER — HYDROCODONE-ACETAMINOPHEN 7.5-650 MG PO TABS: 1.0000 | ORAL_TABLET | Freq: Every day | ORAL | Status: DC | PRN

## 2011-09-28 NOTE — Telephone Encounter (Signed)
Refill request for Hydrocodone.  Pharmacy # in [place] is [phone] and patient call back is [phone]

## 2011-09-28 NOTE — Telephone Encounter (Signed)
rx called into pharmacy

## 2011-10-15 ENCOUNTER — Encounter: Payer: Managed Care, Other (non HMO) | Admitting: Internal Medicine

## 2011-10-17 ENCOUNTER — Ambulatory Visit (INDEPENDENT_AMBULATORY_CARE_PROVIDER_SITE_OTHER): Payer: Managed Care, Other (non HMO) | Admitting: Psychology

## 2011-10-17 DIAGNOSIS — F411 Generalized anxiety disorder: Secondary | ICD-10-CM

## 2011-11-07 ENCOUNTER — Ambulatory Visit: Payer: Managed Care, Other (non HMO) | Admitting: Psychology

## 2011-11-14 ENCOUNTER — Ambulatory Visit (INDEPENDENT_AMBULATORY_CARE_PROVIDER_SITE_OTHER): Payer: Managed Care, Other (non HMO) | Admitting: Psychology

## 2011-11-14 DIAGNOSIS — F411 Generalized anxiety disorder: Secondary | ICD-10-CM

## 2011-11-21 ENCOUNTER — Ambulatory Visit: Payer: Managed Care, Other (non HMO) | Admitting: Psychology

## 2011-11-28 ENCOUNTER — Ambulatory Visit (INDEPENDENT_AMBULATORY_CARE_PROVIDER_SITE_OTHER): Payer: Managed Care, Other (non HMO) | Admitting: Psychology

## 2011-11-28 DIAGNOSIS — F411 Generalized anxiety disorder: Secondary | ICD-10-CM

## 2011-12-05 ENCOUNTER — Ambulatory Visit (INDEPENDENT_AMBULATORY_CARE_PROVIDER_SITE_OTHER): Payer: Managed Care, Other (non HMO) | Admitting: Psychology

## 2011-12-05 DIAGNOSIS — F411 Generalized anxiety disorder: Secondary | ICD-10-CM

## 2011-12-07 ENCOUNTER — Other Ambulatory Visit: Payer: Self-pay | Admitting: *Deleted

## 2011-12-07 MED ORDER — TEMAZEPAM 30 MG PO CAPS: 30.0000 mg | ORAL_CAPSULE | Freq: Every evening | ORAL | Status: DC | PRN

## 2011-12-07 MED ORDER — HYDROCODONE-ACETAMINOPHEN 7.5-650 MG PO TABS: 1.0000 | ORAL_TABLET | Freq: Every day | ORAL | Status: DC | PRN

## 2011-12-07 NOTE — Telephone Encounter (Signed)
rx called into pharmacy

## 2011-12-07 NOTE — Telephone Encounter (Signed)
Ok to fill? Dr. Alphonsus Sias pt

## 2011-12-12 ENCOUNTER — Ambulatory Visit (INDEPENDENT_AMBULATORY_CARE_PROVIDER_SITE_OTHER): Payer: Managed Care, Other (non HMO) | Admitting: Internal Medicine

## 2011-12-12 ENCOUNTER — Encounter: Payer: Self-pay | Admitting: Internal Medicine

## 2011-12-12 ENCOUNTER — Ambulatory Visit (INDEPENDENT_AMBULATORY_CARE_PROVIDER_SITE_OTHER): Payer: Managed Care, Other (non HMO) | Admitting: Psychology

## 2011-12-12 VITALS — BP 128/80 | HR 65 | Temp 98.5°F | Ht 65.0 in | Wt 132.0 lb

## 2011-12-12 DIAGNOSIS — F411 Generalized anxiety disorder: Secondary | ICD-10-CM

## 2011-12-12 NOTE — Progress Notes (Signed)
Subjective:    Patient ID: Rachel Vaughn, female    DOB: September 07, 1961, 51 y.o.   MRN: [id number]  HPI Her boss who she has had problems with in past---called her 3 weeks ago Told her disability was denied and that her doctor told her to go back to work (that is not correct) Went in to discuss with him--actually disability was actually pending Did okay in the office Decided to to give work a try again Really feels supported with weekly sessions with Dr Rachel Vaughn  Working in office on Wednesdays Working from home other days They have agreed to allow her to work only 34 hours Able to get help with unfamiliar things then  Realizes that she has a constant fear of failure This has made it difficult to work --as she worried about her being able to do the work properly She feels that she "is on a roller coaster just waiting to fail" Has never had trusses fail  She still doesn't feel comfortable with this job She knows she needs to go back to some work So willing to try this again  Current Outpatient Prescriptions on File Prior to Visit  Medication Sig Dispense Refill  . busPIRone (BUSPAR) 10 MG tablet Take 1 tablet (10 mg total) by mouth 2 (two) times daily.  60 tablet  11  . citalopram (CELEXA) 20 MG tablet Take 1 tablet (20 mg total) by mouth daily.  90 tablet  3  . hydrocodone-acetaminophen (LORCET PLUS) 7.5-650 MG per tablet Take 1-2 tablets by mouth daily as needed.  60 tablet  0  . omeprazole (PRILOSEC) 20 MG capsule Take 1 capsule (20 mg total) by mouth daily.  30 capsule  11  . temazepam (RESTORIL) 15 MG capsule Take 1 capsule (15 mg total) by mouth 2 (two) times daily as needed.  60 capsule  0  . temazepam (RESTORIL) 30 MG capsule Take 1 capsule (30 mg total) by mouth at bedtime as needed.  30 capsule  5  . valACYclovir (VALTREX) 1000 MG tablet Take 1 tablet (1,000 mg total) by mouth 2 (two) times daily.  60 tablet  0    Allergies  Allergen Reactions  . Fluoxetine Hcl    REACTION: unspecified  . Paroxetine     REACTION: unspecified  . Venlafaxine     REACTION: unspecified    Past Medical History  Diagnosis Date  . Diverticulitis   . Anxiety   . GERD (gastroesophageal reflux disease)   . Asthma   . Social anxiety disorder     Past Surgical History  Procedure Date  . Breast enhancement surgery 1987  . Cesarean section     Family History  Problem Relation Age of Onset  . Asthma Mother   . COPD Mother   . Diabetes Maternal Grandmother   . Cancer Neg Hx     History   Social History  . Marital Status: Married    Spouse Name: N/A    Number of Children: 2  . Years of Education: N/A   Occupational History  . 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 Smoking status: Never Smoker   . Smokeless tobacco: Never Used  . Alcohol Use: Yes  . Drug Use: No  . Sexually Active: Not on file   Other Topics Concern  . Not on file   Social History Narrative  . No narrative on file   Review of Systems Is buying a town home in [place]  Should be very affordable Sleeping okay with the med--still not great Appetite is okay     Objective:   Physical Exam  Constitutional: She appears well-developed and well-nourished. No distress.  Psychiatric:       Mildly anxious--no depression Affect appropriate          Assessment & Plan:

## 2011-12-12 NOTE — Assessment & Plan Note (Signed)
Is some better Still severe anxiety---esp related to work Okay to resume work limited to 34 hours per week and one day a week in the office

## 2011-12-26 ENCOUNTER — Ambulatory Visit: Payer: Managed Care, Other (non HMO) | Admitting: Psychology

## 2012-01-02 ENCOUNTER — Ambulatory Visit (INDEPENDENT_AMBULATORY_CARE_PROVIDER_SITE_OTHER): Payer: Managed Care, Other (non HMO) | Admitting: Psychology

## 2012-01-02 DIAGNOSIS — F411 Generalized anxiety disorder: Secondary | ICD-10-CM

## 2012-01-03 ENCOUNTER — Encounter: Payer: Managed Care, Other (non HMO) | Admitting: Internal Medicine

## 2012-01-09 ENCOUNTER — Other Ambulatory Visit: Payer: Self-pay | Admitting: *Deleted

## 2012-01-09 MED ORDER — CITALOPRAM HYDROBROMIDE 20 MG PO TABS: 20.0000 mg | ORAL_TABLET | Freq: Every day | ORAL | Status: DC

## 2012-01-16 ENCOUNTER — Ambulatory Visit (INDEPENDENT_AMBULATORY_CARE_PROVIDER_SITE_OTHER): Payer: Managed Care, Other (non HMO) | Admitting: Psychology

## 2012-01-16 DIAGNOSIS — F411 Generalized anxiety disorder: Secondary | ICD-10-CM

## 2012-01-23 ENCOUNTER — Ambulatory Visit: Payer: Managed Care, Other (non HMO) | Admitting: Psychology

## 2012-01-30 ENCOUNTER — Ambulatory Visit (INDEPENDENT_AMBULATORY_CARE_PROVIDER_SITE_OTHER): Payer: Managed Care, Other (non HMO) | Admitting: Psychology

## 2012-01-30 DIAGNOSIS — F411 Generalized anxiety disorder: Secondary | ICD-10-CM

## 2012-02-13 ENCOUNTER — Ambulatory Visit (INDEPENDENT_AMBULATORY_CARE_PROVIDER_SITE_OTHER): Payer: Managed Care, Other (non HMO) | Admitting: Psychology

## 2012-02-13 DIAGNOSIS — F411 Generalized anxiety disorder: Secondary | ICD-10-CM

## 2012-02-27 ENCOUNTER — Ambulatory Visit (INDEPENDENT_AMBULATORY_CARE_PROVIDER_SITE_OTHER): Payer: Managed Care, Other (non HMO) | Admitting: Psychology

## 2012-02-27 DIAGNOSIS — F411 Generalized anxiety disorder: Secondary | ICD-10-CM

## 2012-03-04 ENCOUNTER — Other Ambulatory Visit: Payer: Self-pay | Admitting: *Deleted

## 2012-03-04 MED ORDER — HYDROCODONE-ACETAMINOPHEN 7.5-650 MG PO TABS: 1.0000 | ORAL_TABLET | Freq: Every day | ORAL | Status: DC | PRN

## 2012-03-04 NOTE — Telephone Encounter (Signed)
Received refill request from pharmacy. Is it okay to refill medication? 

## 2012-03-04 NOTE — Telephone Encounter (Signed)
Okay #60 x 0 

## 2012-03-04 NOTE — Telephone Encounter (Signed)
rx called into pharmacy

## 2012-03-05 ENCOUNTER — Encounter: Payer: Self-pay | Admitting: Internal Medicine

## 2012-03-05 ENCOUNTER — Ambulatory Visit (INDEPENDENT_AMBULATORY_CARE_PROVIDER_SITE_OTHER): Payer: Managed Care, Other (non HMO) | Admitting: Internal Medicine

## 2012-03-05 VITALS — BP 120/70 | HR 60 | Temp 98.0°F | Ht 65.0 in | Wt 126.0 lb

## 2012-03-05 DIAGNOSIS — Z1231 Encounter for screening mammogram for malignant neoplasm of breast: Secondary | ICD-10-CM

## 2012-03-05 DIAGNOSIS — M545 Low back pain, unspecified: Secondary | ICD-10-CM | POA: Insufficient documentation

## 2012-03-05 DIAGNOSIS — G8929 Other chronic pain: Secondary | ICD-10-CM

## 2012-03-05 DIAGNOSIS — G478 Other sleep disorders: Secondary | ICD-10-CM

## 2012-03-05 DIAGNOSIS — Z0000 Encounter for general adult medical examination without abnormal findings: Secondary | ICD-10-CM | POA: Insufficient documentation

## 2012-03-05 DIAGNOSIS — K219 Gastro-esophageal reflux disease without esophagitis: Secondary | ICD-10-CM

## 2012-03-05 DIAGNOSIS — R002 Palpitations: Secondary | ICD-10-CM | POA: Insufficient documentation

## 2012-03-05 DIAGNOSIS — F411 Generalized anxiety disorder: Secondary | ICD-10-CM

## 2012-03-05 DIAGNOSIS — G479 Sleep disorder, unspecified: Secondary | ICD-10-CM

## 2012-03-05 DIAGNOSIS — Z23 Encounter for immunization: Secondary | ICD-10-CM

## 2012-03-05 LAB — CBC WITH DIFFERENTIAL/PLATELET
Basophils Absolute: 0.1 10*3/uL (ref 0.0–0.1)
Eosinophils Relative: 1.1 % (ref 0.0–5.0)
Lymphs Abs: 2 10*3/uL (ref 0.7–4.0)
Monocytes Absolute: 0.7 10*3/uL (ref 0.1–1.0)
Monocytes Relative: 11.7 % (ref 3.0–12.0)
Neutrophils Relative %: 52.9 % (ref 43.0–77.0)
Platelets: 190 10*3/uL (ref 150.0–400.0)
RDW: 13.7 % (ref 11.5–14.6)
WBC: 6 10*3/uL (ref 4.5–10.5)

## 2012-03-05 LAB — LIPID PANEL
HDL: 60.7 mg/dL (ref >39.00)
Total CHOL/HDL Ratio: 3
Triglycerides: 96 mg/dL (ref 0.0–149.0)

## 2012-03-05 LAB — BASIC METABOLIC PANEL
BUN: 15 mg/dL (ref 6–23)
Creatinine, Ser: 0.7 mg/dL (ref 0.4–1.2)
GFR: 90.36 mL/min (ref >60.00)
Glucose, Bld: 82 mg/dL (ref 70–99)

## 2012-03-05 LAB — TSH: TSH: 0.75 u[IU]/mL (ref 0.35–5.50)

## 2012-03-05 LAB — HEPATIC FUNCTION PANEL
ALT: 11 U/L (ref 0–35)
AST: 14 U/L (ref 0–37)
Bilirubin, Direct: 0 mg/dL (ref 0.0–0.3)
Total Bilirubin: 0.2 mg/dL — ABNORMAL LOW (ref 0.3–1.2)

## 2012-03-05 NOTE — Progress Notes (Signed)
Subjective:    Patient ID: Rachel Vaughn, female    DOB: 02/23/1961, 51 y.o.   MRN: [id number]  HPI Jammed right 3rd finger 4 days ago trying to avoid bug Hit it on roof of car Bad swelling at DIP--can't bend at DIP Has been splinting it   Still working 34 hours a week One day in office and the rest at home Continues to see Dr Laymond Purser for her counseling They have been writing up everyone at [facility] stressful still  Did have pap last year Periods still regular but every 1.5-2 months Due for mammogram  Current Outpatient Prescriptions on File Prior to Visit  Medication Sig Dispense Refill  . busPIRone (BUSPAR) 10 MG tablet Take 1 tablet (10 mg total) by mouth 2 (two) times daily.  60 tablet  11  . citalopram (CELEXA) 20 MG tablet Take 1 tablet (20 mg total) by mouth daily.  90 tablet  3  . hydrocodone-acetaminophen (LORCET PLUS) 7.5-650 MG per tablet Take 1-2 tablets by mouth daily as needed.  60 tablet  0  . omeprazole (PRILOSEC) 20 MG capsule Take 1 capsule (20 mg total) by mouth daily.  30 capsule  11  . temazepam (RESTORIL) 15 MG capsule Take 1 capsule (15 mg total) by mouth 2 (two) times daily as needed.  60 capsule  0  . temazepam (RESTORIL) 30 MG capsule Take 1 capsule (30 mg total) by mouth at bedtime as needed.  30 capsule  5  . valACYclovir (VALTREX) 1000 MG tablet Take 1 tablet (1,000 mg total) by mouth 2 (two) times daily.  60 tablet  0    Allergies  Allergen Reactions  . Fluoxetine Hcl     REACTION: unspecified  . Paroxetine     REACTION: unspecified  . Venlafaxine     REACTION: unspecified    Past Medical History  Diagnosis Date  . Diverticulitis   . Anxiety   . GERD (gastroesophageal reflux disease)   . Asthma   . Social anxiety disorder     Past Surgical History  Procedure Date  . Breast enhancement surgery 1987  . Cesarean section     Family History  Problem Relation Age of Onset  . Asthma Mother   . COPD Mother   . Diabetes Maternal  Grandmother   . Cancer Neg Hx     History   Social History  . Marital Status: Married    Spouse Name: N/A    Number of Children: 2  . Years of Education: N/A   Occupational History  . 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 Smoking status: Never Smoker   . Smokeless tobacco: Never Used  . Alcohol Use: Yes  . Drug Use: No  . Sexually Active: Not on file   Other Topics Concern  . Not on file   Social History Narrative  . No narrative on file   Review of Systems  Constitutional: Negative for fatigue and unexpected weight change.       Wears seat belt  HENT: Positive for congestion and rhinorrhea. Negative for hearing loss, dental problem and tinnitus.        Zytrec helps her allergies Regular with dentist  Eyes: Negative for visual disturbance.       No diplopia or unilateral vision loss Did have some "swirlies" recently--may have been related to the temazepam  Respiratory: Negative for cough, chest tightness and shortness of breath.   Cardiovascular: Positive for palpitations. Negative for  chest pain and leg swelling.       Palps with her anxiety  Gastrointestinal: Negative for nausea, vomiting, abdominal pain, constipation and blood in stool.       No heartburn  Genitourinary: Negative for dysuria, urgency, difficulty urinating and dyspareunia.       No sex now---did counsel on safe sex  Musculoskeletal: Positive for back pain. Negative for joint swelling.       Chronic low back pain Uses 1 lorcet daily usually  Skin: Negative for rash.       No suspicious areas  Neurological: Positive for numbness. Negative for dizziness, weakness, light-headedness and headaches.       Numbness in hands and legs with hyperventilates with anxiety  Hematological: Negative for adenopathy. Bruises/bleeds easily.       Always bruises easily  Psychiatric/Behavioral: Positive for sleep disturbance and dysphoric mood. The patient is nervous/anxious.        Usually  sleeps okay with temazepam---still feels tired and takes afternoon nap Chronic anxiety Ongoing depression--bad on weekends---mild on most days. Relates to fighting the anxiety Tries to do without the daytime temazepam       Objective:   Physical Exam  Constitutional: She is oriented to person, place, and time. She appears well-developed and well-nourished. No distress.  HENT:  Head: Normocephalic and atraumatic.  Right Ear: External ear normal.  Left Ear: External ear normal.  Mouth/Throat: Oropharynx is clear and moist. No oropharyngeal exudate.  Eyes: Conjunctivae and EOM are normal. Pupils are equal, round, and reactive to light.  Neck: Normal range of motion. Neck supple. No thyromegaly present.  Cardiovascular: Normal rate, regular rhythm, normal heart sounds and intact distal pulses.  Exam reveals no gallop.   No murmur heard. Pulmonary/Chest: Effort normal and breath sounds normal. No respiratory distress. She has no wheezes. She has no rales.  Abdominal: Soft. There is no tenderness.  Genitourinary:       Breast implants in place No masses  Musculoskeletal: Normal range of motion. She exhibits no edema and no tenderness.  Lymphadenopathy:    She has no cervical adenopathy.    She has no axillary adenopathy.  Neurological: She is alert and oriented to person, place, and time. She exhibits normal muscle tone. Coordination normal.  Skin: No rash noted. No erythema.  Psychiatric: Her behavior is normal. Thought content normal.       Mild anxiety--but usual for her          Assessment & Plan:

## 2012-03-05 NOTE — Assessment & Plan Note (Signed)
Related to anxiety Will check labs though

## 2012-03-05 NOTE — Assessment & Plan Note (Signed)
Uses the hydrocodone once a day usually

## 2012-03-05 NOTE — Assessment & Plan Note (Signed)
Generally healthy Tdap today Due for mammo UTD on colon

## 2012-03-05 NOTE — Assessment & Plan Note (Signed)
Okay on the PPI 

## 2012-03-05 NOTE — Assessment & Plan Note (Signed)
Okay with the temazepam

## 2012-03-05 NOTE — Assessment & Plan Note (Signed)
Still fairly severe Secondary depression also Stable on current meds Continues to see Dr Laymond Purser for counseling

## 2012-03-07 ENCOUNTER — Encounter: Payer: Self-pay | Admitting: *Deleted

## 2012-03-26 ENCOUNTER — Ambulatory Visit (INDEPENDENT_AMBULATORY_CARE_PROVIDER_SITE_OTHER): Payer: Managed Care, Other (non HMO) | Admitting: Psychology

## 2012-03-26 DIAGNOSIS — F411 Generalized anxiety disorder: Secondary | ICD-10-CM

## 2012-03-31 ENCOUNTER — Ambulatory Visit (INDEPENDENT_AMBULATORY_CARE_PROVIDER_SITE_OTHER): Payer: Managed Care, Other (non HMO) | Admitting: Family Medicine

## 2012-03-31 ENCOUNTER — Encounter: Payer: Self-pay | Admitting: Family Medicine

## 2012-03-31 VITALS — BP 120/80 | HR 64 | Temp 97.9°F | Wt 125.0 lb

## 2012-03-31 DIAGNOSIS — F411 Generalized anxiety disorder: Secondary | ICD-10-CM

## 2012-03-31 NOTE — Progress Notes (Signed)
Subjective:    Patient ID: Rachel Vaughn, female    DOB: 17-Dec-1960, 51 y.o.   MRN: [id number]  HPI 51 yo with h/o anxiety here to discuss worsening anxiety for past few days.  Notes reviewed, has been working with Dr. Alphonsus Sias for her anxiety, worsened by her job, for years. Per pt, she feels that between Dr. Alphonsus Sias and Dr. Laymond Purser, she finally felt like her anxiety was under control.   Previously on disability but now back to work with reduced hours and had been doing well. She was getting her projects done, her boss was not complaining.  Feels that her current medications are working well.  On Friday, her boss sent her an email requesting a stat project to be completed by Monday. Pt states this "sent her into an anxiety tailspin."  She feels they are bullying her.  She feels when this type of behavior occurs at work, she is unable to manage her anxiety.  Really feels supported with weekly sessions with Dr Laymond Purser- seeing her every week.  She does not feel she needs to be out of work permanently but would like a note stating it is ok for her to stay out of work until Dr. Alphonsus Sias returns and she can discuss this with him.  Current Outpatient Prescriptions on File Prior to Visit  Medication Sig Dispense Refill  . busPIRone (BUSPAR) 10 MG tablet Take 1 tablet (10 mg total) by mouth 2 (two) times daily.  60 tablet  11  . cetirizine (ZYRTEC) 10 MG tablet Take 10 mg by mouth daily.      . citalopram (CELEXA) 20 MG tablet Take 1 tablet (20 mg total) by mouth daily.  90 tablet  3  . hydrocodone-acetaminophen (LORCET PLUS) 7.5-650 MG per tablet Take 1-2 tablets by mouth daily as needed.  60 tablet  0  . omeprazole (PRILOSEC) 20 MG capsule Take 1 capsule (20 mg total) by mouth daily.  30 capsule  11  . temazepam (RESTORIL) 15 MG capsule Take 1 capsule (15 mg total) by mouth 2 (two) times daily as needed.  60 capsule  0  . temazepam (RESTORIL) 30 MG capsule Take 1 capsule (30 mg total) by mouth at  bedtime as needed.  30 capsule  5  . valACYclovir (VALTREX) 1000 MG tablet Take 1 tablet (1,000 mg total) by mouth 2 (two) times daily.  60 tablet  0    Allergies  Allergen Reactions  . Fluoxetine Hcl     REACTION: unspecified  . Paroxetine     REACTION: unspecified  . Venlafaxine     REACTION: unspecified    Past Medical History  Diagnosis Date  . Diverticulitis   . Anxiety   . GERD (gastroesophageal reflux disease)   . Asthma   . Social anxiety disorder     Past Surgical History  Procedure Date  . Breast enhancement surgery 1987  . Cesarean section     Family History  Problem Relation Age of Onset  . Asthma Mother   . COPD Mother   . Diabetes Maternal Grandmother   . Cancer Neg Hx     History   Social History  . Marital Status: Married    Spouse Name: N/A    Number of Children: 2  . Years of Education: N/A   Occupational History  . 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 Smoking status: Never Smoker   . Smokeless tobacco: Never Used  . Alcohol Use:  Yes  . Drug Use: No  . Sexually Active: Not on file   Other Topics Concern  . Not on file   Social History Narrative  . No narrative on file   Review of Systems See HPI    Sleeping ok Appetite fair No SI or HI Objective:   Physical Exam  BP 120/80  Pulse 64  Temp 97.9 F (36.6 C)  Wt 125 lb (56.7 kg)  Constitutional: She appears well-developed and well-nourished. No distress.  Psychiatric:       Mildly anxious Affect appropriate and pleasant      Assessment & Plan:   1. ANXIETY   Deteriorated. >25 min spent with face to face with patient, >50% counseling and/or coordinating care. Agreed that is is appropriate to write a note excusing her until she can discuss with her PCP. Note given to pt.  Advised continued psychotherapy with Dr. Laymond Purser. Will not adjust medication at this time (pt agreed).

## 2012-03-31 NOTE — Patient Instructions (Addendum)
It was good to see you. Please schedule a follow up with Dr. Alphonsus Sias in the next 1-2 weeks. Hang in there.

## 2012-04-07 ENCOUNTER — Telehealth: Payer: Self-pay | Admitting: *Deleted

## 2012-04-07 NOTE — Telephone Encounter (Signed)
Okay I didn't realize Will review her status and do the forms at the visit

## 2012-04-07 NOTE — Telephone Encounter (Signed)
Spoke with patient about disability paperwork and she was seen by Dr. Dayton Martes on 03/31/2012 and was told to remain out of work until [employer] returns, pt has appt on 04/09/2012.    ( I will hold paperwork at my desk until her appt on Wednesday 04/09/12)

## 2012-04-07 NOTE — Telephone Encounter (Signed)
Calling patient to discuss disability paper work we received, per [employer] he thought patient was back at work and if it needs to be done again we need dates she was out.  .left message to have patient return my call.

## 2012-04-09 ENCOUNTER — Encounter: Payer: Self-pay | Admitting: Internal Medicine

## 2012-04-09 ENCOUNTER — Ambulatory Visit (INDEPENDENT_AMBULATORY_CARE_PROVIDER_SITE_OTHER): Payer: Managed Care, Other (non HMO) | Admitting: Psychology

## 2012-04-09 ENCOUNTER — Ambulatory Visit (INDEPENDENT_AMBULATORY_CARE_PROVIDER_SITE_OTHER): Payer: Managed Care, Other (non HMO) | Admitting: Internal Medicine

## 2012-04-09 VITALS — BP 110/60 | HR 74 | Temp 98.3°F | Ht 65.0 in | Wt 123.0 lb

## 2012-04-09 DIAGNOSIS — F410 Panic disorder [episodic paroxysmal anxiety] without agoraphobia: Secondary | ICD-10-CM

## 2012-04-09 DIAGNOSIS — F411 Generalized anxiety disorder: Secondary | ICD-10-CM

## 2012-04-09 NOTE — Assessment & Plan Note (Signed)
Was doing okay on restricted schedule than supervisor demanded weekend work and additional project This brought on panic and paralysis that her anxiety can cause She is better now but wonders about being able to continue to work there under these circumstances (again) She has been able to work though I recommended formally using the attorney she consulted in past---to protect her rights  Paperwork for disability done Started new claim on 6/24 Will plan to restart on 7/18 after she is working through her current anxiety spell  30 minutes --mostly care planning, etc

## 2012-04-09 NOTE — Progress Notes (Signed)
Subjective:    Patient ID: Rachel Vaughn, female    DOB: 11-Sep-1961, 51 y.o.   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 on that day and doing okay Then supervisor called her to talk about another project that they wanted her to do over weekend Didn't respect her need for limited hours  Anxiety built up and she had to call in as being sick the rest of the day Realizes that there is stress in all work Only has 9th grade education so not excited about looking for something else  Very upset that they would renege on their agreement to do reduced hours (34 hours per week)  Physical problems--constant diarrhea Not sleeping too bad Appetite is off  Still concerned about need to do tasks that she is not trained for--like engineering, etc aspects of the work  Current Outpatient Prescriptions on File Prior to Visit  Medication Sig Dispense Refill  . busPIRone (BUSPAR) 10 MG tablet Take 1 tablet (10 mg total) by mouth 2 (two) times daily.  60 tablet  11  . cetirizine (ZYRTEC) 10 MG tablet Take 10 mg by mouth daily.      . citalopram (CELEXA) 20 MG tablet Take 1 tablet (20 mg total) by mouth daily.  90 tablet  3  . hydrocodone-acetaminophen (LORCET PLUS) 7.5-650 MG per tablet Take 1-2 tablets by mouth daily as needed.  60 tablet  0  . omeprazole (PRILOSEC) 20 MG capsule Take 1 capsule (20 mg total) by mouth daily.  30 capsule  11  . temazepam (RESTORIL) 15 MG capsule Take 1 capsule (15 mg total) by mouth 2 (two) times daily as needed.  60 capsule  0  . temazepam (RESTORIL) 30 MG capsule Take 1 capsule (30 mg total) by mouth at bedtime as needed.  30 capsule  5  . valACYclovir (VALTREX) 1000 MG tablet Take 1 tablet (1,000 mg total) by mouth 2 (two) times daily.  60 tablet  0    Allergies  Allergen Reactions  . Fluoxetine Hcl     REACTION: unspecified  . Paroxetine     REACTION: unspecified  . Venlafaxine     REACTION: unspecified    Past  Medical History  Diagnosis Date  . Diverticulitis   . Anxiety   . GERD (gastroesophageal reflux disease)   . Asthma   . Social anxiety disorder     Past Surgical History  Procedure Date  . Breast enhancement surgery 1987  . Cesarean section     Family History  Problem Relation Age of Onset  . Asthma Mother   . COPD Mother   . Diabetes Maternal Grandmother   . Cancer Neg Hx     History   Social History  . Marital Status: Married    Spouse Name: N/A    Number of Children: 2  . Years of Education: N/A   Occupational History  . 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 Smoking status: Never Smoker   . Smokeless tobacco: Never Used  . Alcohol Use: Yes  . Drug Use: No  . Sexually Active: Not on file   Other Topics Concern  . Not on file   Social History Narrative  . No narrative on file   Review of Systems Has lost some weight since last visit due to stress Weight down 15# since moving back from [place]    Objective:   Physical Exam  Constitutional:  She appears well-developed and well-nourished. No distress.  Psychiatric: Judgment and thought content normal.       Very anxious Affect appropriate to mood           Assessment & Plan:

## 2012-04-15 ENCOUNTER — Ambulatory Visit: Payer: Managed Care, Other (non HMO) | Admitting: Psychology

## 2012-04-22 ENCOUNTER — Ambulatory Visit (INDEPENDENT_AMBULATORY_CARE_PROVIDER_SITE_OTHER): Payer: Managed Care, Other (non HMO) | Admitting: Psychology

## 2012-04-22 DIAGNOSIS — F411 Generalized anxiety disorder: Secondary | ICD-10-CM

## 2012-04-24 ENCOUNTER — Ambulatory Visit (INDEPENDENT_AMBULATORY_CARE_PROVIDER_SITE_OTHER): Payer: Managed Care, Other (non HMO) | Admitting: Internal Medicine

## 2012-04-24 ENCOUNTER — Other Ambulatory Visit: Payer: Self-pay | Admitting: *Deleted

## 2012-04-24 ENCOUNTER — Encounter: Payer: Self-pay | Admitting: Internal Medicine

## 2012-04-24 VITALS — BP 120/80 | HR 69 | Temp 98.1°F | Ht 65.0 in | Wt 123.0 lb

## 2012-04-24 DIAGNOSIS — F410 Panic disorder [episodic paroxysmal anxiety] without agoraphobia: Secondary | ICD-10-CM

## 2012-04-24 MED ORDER — HYDROCODONE-ACETAMINOPHEN 7.5-650 MG PO TABS: 1.0000 | ORAL_TABLET | Freq: Every day | ORAL | Status: DC | PRN
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
## 2012-04-24 NOTE — Telephone Encounter (Signed)
rx called into pharmacy

## 2012-04-24 NOTE — Telephone Encounter (Signed)
Okay #60 x 0 

## 2012-04-30 ENCOUNTER — Ambulatory Visit (INDEPENDENT_AMBULATORY_CARE_PROVIDER_SITE_OTHER): Payer: Managed Care, Other (non HMO) | Admitting: Psychology

## 2012-04-30 DIAGNOSIS — F411 Generalized anxiety disorder: Secondary | ICD-10-CM

## 2012-05-06 ENCOUNTER — Ambulatory Visit: Payer: Managed Care, Other (non HMO) | Admitting: Psychology

## 2012-05-14 ENCOUNTER — Ambulatory Visit: Payer: Managed Care, Other (non HMO) | Admitting: Internal Medicine

## 2012-05-14 ENCOUNTER — Ambulatory Visit (INDEPENDENT_AMBULATORY_CARE_PROVIDER_SITE_OTHER): Payer: Managed Care, Other (non HMO) | Admitting: Psychology

## 2012-05-14 DIAGNOSIS — F411 Generalized anxiety disorder: Secondary | ICD-10-CM

## 2012-05-20 ENCOUNTER — Other Ambulatory Visit: Payer: Self-pay | Admitting: Family Medicine

## 2012-05-20 ENCOUNTER — Other Ambulatory Visit: Payer: Self-pay | Admitting: Internal Medicine

## 2012-05-20 NOTE — Telephone Encounter (Signed)
Pt left v/m requesting refill temazepam 15 mg to [facility].Please advise.

## 2012-05-21 ENCOUNTER — Ambulatory Visit (INDEPENDENT_AMBULATORY_CARE_PROVIDER_SITE_OTHER): Payer: Managed Care, Other (non HMO) | Admitting: Psychology

## 2012-05-21 DIAGNOSIS — F411 Generalized anxiety disorder: Secondary | ICD-10-CM

## 2012-05-21 MED ORDER — HYDROCODONE-ACETAMINOPHEN 7.5-650 MG PO TABS: 1.0000 | ORAL_TABLET | Freq: Every day | ORAL | Status: DC | PRN

## 2012-05-21 MED ORDER — TEMAZEPAM 15 MG PO CAPS: 15.0000 mg | ORAL_CAPSULE | Freq: Two times a day (BID) | ORAL | Status: DC | PRN

## 2012-05-21 NOTE — Telephone Encounter (Signed)
rx called into pharmacy

## 2012-05-21 NOTE — Telephone Encounter (Signed)
Okay temazepam #60 x 1 If she requested the hydrocodone, okay #60 x 0

## 2012-05-26 ENCOUNTER — Encounter: Payer: Self-pay | Admitting: Internal Medicine

## 2012-05-26 ENCOUNTER — Ambulatory Visit (INDEPENDENT_AMBULATORY_CARE_PROVIDER_SITE_OTHER): Payer: Managed Care, Other (non HMO) | Admitting: Internal Medicine

## 2012-05-26 VITALS — BP 110/62 | HR 62 | Temp 98.3°F | Wt 121.0 lb

## 2012-05-26 DIAGNOSIS — F410 Panic disorder [episodic paroxysmal anxiety] without agoraphobia: Secondary | ICD-10-CM

## 2012-05-26 MED ORDER — CITALOPRAM HYDROBROMIDE 20 MG PO TABS: 20.0000 mg | ORAL_TABLET | Freq: Every day | ORAL | Status: DC

## 2012-05-26 MED ORDER — TEMAZEPAM 30 MG PO CAPS: 30.0000 mg | ORAL_CAPSULE | Freq: Every evening | ORAL | Status: DC | PRN

## 2012-05-26 MED ORDER — BUSPIRONE HCL 10 MG PO TABS: 10.0000 mg | ORAL_TABLET | Freq: Two times a day (BID) | ORAL | Status: DC

## 2012-05-26 NOTE — Progress Notes (Signed)
  Subjective:    Patient ID: Rachel Vaughn, female    DOB: 1961-06-19, 51 y.o.   MRN: [id number]  HPI Done with [employer] Only dealing with HR person They are asking her to sign paperwork and she needs legal counsel  Is approved for 1st 26 weeks of disability Will have to apply for disability after that----I discussed that this is unlikely to be approved  Still gets scared about this whole process Anxiety still tough with this ongoing contact  Has gone back to staying home behind her closed doors and blinds Will go out to grocery store only Plans to  meet with a friend soon--it will be the first time she has gone out  Current Outpatient Prescriptions on File Prior to Visit  Medication Sig Dispense Refill  . busPIRone (BUSPAR) 10 MG tablet Take 1 tablet (10 mg total) by mouth 2 (two) times daily.  60 tablet  11  . cetirizine (ZYRTEC) 10 MG tablet Take 10 mg by mouth daily.      . citalopram (CELEXA) 20 MG tablet Take 1 tablet (20 mg total) by mouth daily.  90 tablet  3  . hydrocodone-acetaminophen (LORCET PLUS) 7.5-650 MG per tablet Take 1-2 tablets by mouth daily as needed.  60 tablet  0  . omeprazole (PRILOSEC) 20 MG capsule Take 1 capsule (20 mg total) by mouth daily.  30 capsule  11  . temazepam (RESTORIL) 15 MG capsule Take 1 capsule (15 mg total) by mouth 2 (two) times daily as needed.  60 capsule  1  . temazepam (RESTORIL) 30 MG capsule Take 1 capsule (30 mg total) by mouth at bedtime as needed.  30 capsule  5  . valACYclovir (VALTREX) 1000 MG tablet TAKE ONE TABLET BY MOUTH TWICE DAILY  60 tablet  0    Allergies  Allergen Reactions  . Fluoxetine Hcl     REACTION: unspecified  . Paroxetine     REACTION: unspecified  . Venlafaxine     REACTION: unspecified    Past Medical History  Diagnosis Date  . Diverticulitis   . Anxiety   . GERD (gastroesophageal reflux disease)   . Asthma   . Social anxiety disorder     Past Surgical History  Procedure Date  . Breast  enhancement surgery 1987  . Cesarean section     Family History  Problem Relation Age of Onset  . Asthma Mother   . COPD Mother   . Diabetes Maternal Grandmother   . Cancer Neg Hx     History   Social History  . Marital Status: Married    Spouse Name: N/A    Number of Children: 2  . Years of Education: N/A   Occupational History  . 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 Smoking status: Never Smoker   . Smokeless tobacco: Never Used  . Alcohol Use: Yes  . Drug Use: No  . Sexually Active: Not on file   Other Topics Concern  . Not on file   Social History Narrative  . No narrative on file   Review of Systems Sleeping okay with med Appetite is okay     Objective:   Physical Exam  Psychiatric:       Still anxious Appropriate affect          Assessment & Plan:

## 2012-05-26 NOTE — Assessment & Plan Note (Signed)
Somewhat better now that she is not working but still stressed with trying to reach settlement with [employer] I did recommend she at least have the agreement vetted by attorney Will continue the current meds Counseled over half of 25 minute visit

## 2012-05-28 ENCOUNTER — Ambulatory Visit: Payer: Managed Care, Other (non HMO) | Admitting: Psychology

## 2012-06-03 ENCOUNTER — Other Ambulatory Visit: Payer: Self-pay

## 2012-06-03 NOTE — Telephone Encounter (Signed)
[facility] fax[facility] refill request for temazepam 30 mg. Spoke with April at [facility] dening refill. Refilled 05/26/12 to [facility].

## 2012-08-01 ENCOUNTER — Other Ambulatory Visit: Payer: Self-pay | Admitting: Family Medicine

## 2012-08-01 MED ORDER — HYDROCODONE-ACETAMINOPHEN 7.5-650 MG PO TABS: 1.0000 | ORAL_TABLET | Freq: Every day | ORAL | Status: DC | PRN

## 2012-08-01 NOTE — Telephone Encounter (Signed)
Okay #60 x 0 

## 2012-08-01 NOTE — Telephone Encounter (Signed)
rx called into pharmacy

## 2012-08-01 NOTE — Telephone Encounter (Signed)
[facility] fax[facility] RX refill for hydrocodone-acetaminophin [phone].  Please advise.

## 2012-08-01 NOTE — Telephone Encounter (Signed)
Opened in error

## 2012-08-01 NOTE — Telephone Encounter (Deleted)
[facility] [address] [place] [facility] RX refill for

## 2012-08-12 ENCOUNTER — Other Ambulatory Visit: Payer: Self-pay | Admitting: *Deleted

## 2012-08-12 NOTE — Telephone Encounter (Signed)
Please find out what happened to that Rx It is too soon for her to be needing another prescription

## 2012-08-12 NOTE — Telephone Encounter (Signed)
Last filled 08/01/2012

## 2012-08-12 NOTE — Telephone Encounter (Signed)
[facility]/01/2012 @ 4:00 and picked up 08/11/2012.  Marland Kitchenleft message to have patient return my call.

## 2012-08-14 NOTE — Telephone Encounter (Signed)
.  left message to have patient return my call. rx has been canceled

## 2012-09-08 ENCOUNTER — Encounter: Payer: Self-pay | Admitting: Internal Medicine

## 2012-09-08 ENCOUNTER — Ambulatory Visit (INDEPENDENT_AMBULATORY_CARE_PROVIDER_SITE_OTHER): Payer: PRIVATE HEALTH INSURANCE | Admitting: Internal Medicine

## 2012-09-08 VITALS — BP 118/70 | HR 58 | Temp 97.7°F | Wt 120.0 lb

## 2012-09-08 DIAGNOSIS — F411 Generalized anxiety disorder: Secondary | ICD-10-CM

## 2012-09-08 DIAGNOSIS — Z23 Encounter for immunization: Secondary | ICD-10-CM

## 2012-09-08 NOTE — Addendum Note (Signed)
Addended by: Sueanne Margarita on: 09/08/2012 05:19 PM   Modules accepted: Orders
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
## 2012-09-08 NOTE — Assessment & Plan Note (Signed)
Much better Finally finished with her past job and took their settlement Very happy in her new job Is out and has more people interaction and doing well with this

## 2012-10-12 ENCOUNTER — Other Ambulatory Visit: Payer: Self-pay | Admitting: Internal Medicine

## 2012-10-13 NOTE — Telephone Encounter (Signed)
Okay #30 x 1 

## 2012-10-13 NOTE — Telephone Encounter (Signed)
rx called into pharmacy

## 2012-10-17 ENCOUNTER — Other Ambulatory Visit: Payer: Self-pay

## 2012-10-17 MED ORDER — HYDROCODONE-ACETAMINOPHEN 7.5-650 MG PO TABS: 1.0000 | ORAL_TABLET | Freq: Every day | ORAL | Status: DC | PRN

## 2012-10-17 NOTE — Telephone Encounter (Signed)
rx called into pharmacy

## 2012-10-17 NOTE — Telephone Encounter (Signed)
Okay #60 x 0 

## 2012-10-17 NOTE — Telephone Encounter (Signed)
Pt left v/m requesting refill hydrocodone apap to [facility] [place].Please advise.

## 2012-12-11 ENCOUNTER — Ambulatory Visit: Payer: PRIVATE HEALTH INSURANCE | Admitting: Internal Medicine

## 2012-12-11 ENCOUNTER — Other Ambulatory Visit: Payer: Self-pay | Admitting: Internal Medicine

## 2012-12-13 NOTE — Telephone Encounter (Signed)
Ok to refill? Last refill 10/12/12 and 10/17/12. Last OV 09/08/12.

## 2012-12-15 NOTE — Telephone Encounter (Signed)
Medication phoned to pharmacy. Patient advised.  

## 2012-12-15 NOTE — Telephone Encounter (Signed)
Pt request call back when med called to pharmacy; [phone].

## 2012-12-15 NOTE — Telephone Encounter (Signed)
plz phone in and notify pt. 

## 2012-12-25 ENCOUNTER — Ambulatory Visit (INDEPENDENT_AMBULATORY_CARE_PROVIDER_SITE_OTHER): Payer: BC Managed Care – PPO | Admitting: Internal Medicine

## 2012-12-25 ENCOUNTER — Encounter: Payer: Self-pay | Admitting: Internal Medicine

## 2012-12-25 VITALS — BP 110/60 | HR 60 | Temp 97.9°F | Wt 113.0 lb

## 2012-12-25 DIAGNOSIS — F411 Generalized anxiety disorder: Secondary | ICD-10-CM

## 2012-12-25 NOTE — Progress Notes (Signed)
  Subjective:    Patient ID: Rachel Vaughn, female    DOB: 1961-06-08, 52 y.o.   MRN: [id number]  HPI Having more trouble with anxiety "I'm ready to quit (life)" Doing well at work but she does the work of 4 people because she fears keeping her job  It keeps her from enjoying the work which she really does like  Had fall at work and was knocked unconscious--- about 3 weeks ago Woke in the ambulance Evaluated in ER after head CT Contusion right hip and shoulder They made her stay out a week--then right back Doesn't want to pursue worker's comp  Relates the lifelong feeling that "I am not good enough"  Current Outpatient Prescriptions on File Prior to Visit  Medication Sig Dispense Refill  . busPIRone (BUSPAR) 10 MG tablet Take 1 tablet (10 mg total) by mouth 2 (two) times daily.  180 tablet  3  . cetirizine (ZYRTEC) 10 MG tablet Take 10 mg by mouth daily.      . citalopram (CELEXA) 20 MG tablet Take 1 tablet (20 mg total) by mouth daily.  90 tablet  3  . HYDROcodone-acetaminophen (VICODIN ES) 7.5-750 MG per tablet take 1 to 2 tablets by mouth once daily AS NEEDED  60 tablet  0  . omeprazole (PRILOSEC) 20 MG capsule Take 1 capsule (20 mg total) by mouth daily.  30 capsule  11  . temazepam (RESTORIL) 30 MG capsule take 1 capsule at bedtime AS NEEDED FOR SLEEP  30 capsule  1  . valACYclovir (VALTREX) 1000 MG tablet TAKE ONE TABLET BY MOUTH TWICE DAILY  60 tablet  0   No current facility-administered medications on file prior to visit.    Allergies  Allergen Reactions  . Fluoxetine Hcl     REACTION: unspecified  . Paroxetine     REACTION: unspecified  . Venlafaxine     REACTION: unspecified    Past Medical History  Diagnosis Date  . Diverticulitis   . Anxiety   . GERD (gastroesophageal reflux disease)   . Asthma   . Social anxiety disorder     Past Surgical History  Procedure Laterality Date  . Breast enhancement surgery  1987  . Cesarean section      Family History   Problem Relation Age of Onset  . Asthma Mother   . COPD Mother   . Diabetes Maternal Grandmother   . Cancer Neg Hx     History   Social History  . Marital Status: Married    Spouse Name: N/A    Number of Children: 2  . Years of Education: N/A   Occupational History  . 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 Smoking status: Never Smoker   . Smokeless tobacco: Never Used  . Alcohol Use: Yes  . Drug Use: No  . Sexually Active: Not on file   Other Topics Concern  . Not on file   Social History Narrative  . No narrative on file   Review of Systems Not eating that well---doesn't eat at work for fear that she will have IBS Weight is down 7-8#    Objective:   Physical Exam  Psychiatric:  Tearful explaining her issues from being a child Admits the OCD problems, etc          Assessment & Plan:

## 2012-12-25 NOTE — Assessment & Plan Note (Signed)
Ongoing anxiety Never happy with herself due to past family and marriage problems Discussed going back to therapy---probably needs a new counselor in [place] She thinks a support group might help but isn't sure what she needs Continue same meds  Counseling almost all of 25 minute visit

## 2013-01-08 ENCOUNTER — Encounter: Payer: Self-pay | Admitting: Internal Medicine

## 2013-01-08 ENCOUNTER — Ambulatory Visit (INDEPENDENT_AMBULATORY_CARE_PROVIDER_SITE_OTHER): Payer: BC Managed Care – PPO | Admitting: Internal Medicine

## 2013-01-08 VITALS — BP 110/60 | HR 63 | Temp 97.5°F | Wt 111.0 lb

## 2013-01-08 DIAGNOSIS — G8929 Other chronic pain: Secondary | ICD-10-CM

## 2013-01-08 DIAGNOSIS — F410 Panic disorder [episodic paroxysmal anxiety] without agoraphobia: Secondary | ICD-10-CM

## 2013-01-08 DIAGNOSIS — M545 Low back pain, unspecified: Secondary | ICD-10-CM

## 2013-01-08 NOTE — Patient Instructions (Signed)
Please go back on the twice a day daytime temazepam

## 2013-01-08 NOTE — Progress Notes (Signed)
  Subjective:    Patient ID: Rachel Vaughn, female    DOB: 1961/02/22, 52 y.o.   MRN: [id number]  HPI Feels she may be doing worse Had bad meltdown at work today--not sure what caused it (may be reacting to the fact that her son is moving to [place] ---and he had been working with her). Kinda felt better after this Really would prefer social isolation--like when she worked from home Still high strung and nervous----can't relax at work. 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 busPIRone (BUSPAR) 10 MG tablet Take 1 tablet (10 mg total) by mouth 2 (two) times daily.  180 tablet  3  . cetirizine (ZYRTEC) 10 MG tablet Take 10 mg by mouth daily.      . citalopram (CELEXA) 20 MG tablet Take 1 tablet (20 mg total) by mouth daily.  90 tablet  3  . HYDROcodone-acetaminophen (VICODIN ES) 7.5-750 MG per tablet take 1 to 2 tablets by mouth once daily AS NEEDED  60 tablet  0  . omeprazole (PRILOSEC) 20 MG capsule Take 1 capsule (20 mg total) by mouth daily.  30 capsule  11  . temazepam (RESTORIL) 30 MG capsule take 1 capsule at bedtime AS NEEDED FOR SLEEP  30 capsule  1  . valACYclovir (VALTREX) 1000 MG tablet TAKE ONE TABLET BY MOUTH TWICE DAILY  60 tablet  0   No current facility-administered medications on file prior to visit.    Allergies  Allergen Reactions  . Fluoxetine Hcl     REACTION: unspecified  . Paroxetine     REACTION: unspecified  . Venlafaxine     REACTION: unspecified    Past Medical History  Diagnosis Date  . Diverticulitis   . Anxiety   . GERD (gastroesophageal reflux disease)   . Asthma   . Social anxiety disorder     Past Surgical History  Procedure Laterality Date  . Breast enhancement surgery  1987  . Cesarean  section      Family History  Problem Relation Age of Onset  . Asthma Mother   . COPD Mother   . Diabetes Maternal Grandmother   . Cancer Neg Hx     History   Social History  . Marital Status: Married    Spouse Name: N/A    Number of Children: 2  . Years of Education: N/A   Occupational History  . 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 Smoking status: Never Smoker   . Smokeless tobacco: Never Used  . Alcohol Use: Yes  . Drug Use: No  . Sexually Active: Not on file   Other Topics Concern  . Not on file   Social History Narrative  . No narrative on file   Review of Systems Sleep has been off lately---some new stressors on her mind (knew she had to talk to her boss about moving around in the store) Weight down 1#     Objective:   Physical Exam  Psychiatric:  Anxious Appropriate interaction          Assessment & Plan:

## 2013-01-08 NOTE — Assessment & Plan Note (Signed)
Ongoing problems Urged her to go back on daytime temazepam that has helped in the past

## 2013-01-08 NOTE — Assessment & Plan Note (Signed)
Going to [facility] center for evaluation

## 2013-02-10 ENCOUNTER — Other Ambulatory Visit: Payer: Self-pay | Admitting: Family Medicine

## 2013-02-10 NOTE — Telephone Encounter (Signed)
OK to refill? Last filled 12/11/12.

## 2013-02-11 MED ORDER — TEMAZEPAM 30 MG PO CAPS: 30.0000 mg | ORAL_CAPSULE | Freq: Every evening | ORAL | Status: DC | PRN

## 2013-02-11 NOTE — Telephone Encounter (Signed)
rx called into pharmacy

## 2013-02-11 NOTE — Telephone Encounter (Signed)
Temazepam last filled 01/12/13

## 2013-02-11 NOTE — Telephone Encounter (Signed)
Okay #60 x 0 for hydrocodone #30 x 1 for the temazepam 30mg 

## 2013-02-13 ENCOUNTER — Ambulatory Visit: Payer: BC Managed Care – PPO | Admitting: Internal Medicine

## 2013-02-17 ENCOUNTER — Encounter: Payer: Self-pay | Admitting: Internal Medicine

## 2013-02-17 ENCOUNTER — Ambulatory Visit (INDEPENDENT_AMBULATORY_CARE_PROVIDER_SITE_OTHER): Payer: Managed Care, Other (non HMO) | Admitting: Internal Medicine

## 2013-02-17 VITALS — BP 100/60 | HR 71 | Temp 97.5°F | Wt 114.0 lb

## 2013-02-17 DIAGNOSIS — F410 Panic disorder [episodic paroxysmal anxiety] without agoraphobia: Secondary | ICD-10-CM

## 2013-02-17 MED ORDER — ALPRAZOLAM 0.25 MG PO TABS: 0.2500 mg | ORAL_TABLET | Freq: Three times a day (TID) | ORAL | Status: DC | PRN

## 2013-02-17 NOTE — Progress Notes (Signed)
  Subjective:    Patient ID: Rachel Vaughn, female    DOB: [birth year], 51 y.o.   MRN: [id number]  HPI Doing okay today  Did see orthopedist--has sprain in back and possible ruptured disc.  Planning epidural steroid shot Now on light duty at work--sampling things.  Nervous anticipating the work but then settles down as she gets into it  Has tried the daytime temazepam---now it makes her too sleepy Still depressed --- trouble fighting with insurance about back things Son is still here---moving to [place] at end of month  Current Outpatient Prescriptions on File Prior to Visit  Medication Sig Dispense Refill  . busPIRone (BUSPAR) 10 MG tablet Take 1 tablet (10 mg total) by mouth 2 (two) times daily.  180 tablet  3  . cetirizine (ZYRTEC) 10 MG tablet Take 10 mg by mouth daily.      . citalopram (CELEXA) 20 MG tablet Take 1 tablet (20 mg total) by mouth daily.  90 tablet  3  . HYDROcodone-acetaminophen (VICODIN ES) 7.5-750 MG per tablet Take 1-2 tablets by mouth daily as needed.  60 tablet  0  . omeprazole (PRILOSEC) 20 MG capsule Take 1 capsule (20 mg total) by mouth daily.  30 capsule  11  . temazepam (RESTORIL) 15 MG capsule Take 15 mg by mouth 2 (two) times daily.      . temazepam (RESTORIL) 30 MG capsule Take 1 capsule (30 mg total) by mouth at bedtime as needed for sleep.  30 capsule  1  . valACYclovir (VALTREX) 1000 MG tablet TAKE ONE TABLET BY MOUTH TWICE DAILY  60 tablet  0   No current facility-administered medications on file prior to visit.    Allergies  Allergen Reactions  . Fluoxetine Hcl     REACTION: unspecified  . Paroxetine     REACTION: unspecified  . Venlafaxine     REACTION: unspecified    Past Medical History  Diagnosis Date  . Diverticulitis   . Anxiety   . GERD (gastroesophageal reflux disease)   . Asthma   . Social anxiety disorder     Past Surgical History  Procedure Laterality Date  . Breast enhancement surgery  1987  . Cesarean section       Family History  Problem Relation Age of Onset  . Asthma Mother   . COPD Mother   . Diabetes Maternal Grandmother   . Cancer Neg Hx     History   Social History  . Marital Status: Married    Spouse Name: N/A    Number of Children: 2  . Years of Education: N/A   Occupational History  . 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 Smoking status: Never Smoker   . Smokeless tobacco: Never Used  . Alcohol Use: Yes  . Drug Use: No  . Sexually Active: Not on file   Other Topics Concern  . Not on file   Social History Narrative  . No narrative on file   Review of Systems Sleeps okay Appetite is okay--weight is up a few pounds Happy with her current weight    Objective:   Physical Exam  Constitutional: She appears well-developed and well-nourished. No distress.  Psychiatric: She has a normal mood and affect. Her behavior is normal.  Normal appearance Speech is full and conversant          Assessment & Plan:

## 2013-02-17 NOTE — Assessment & Plan Note (Signed)
Doing some better Comfortable with current job and then spending most of her spare time alone Still doing okay with her job (though light duty due to her back) Daytime temazepam now sedating---Rx for alprazolam for prn panic spells

## 2013-04-03 ENCOUNTER — Other Ambulatory Visit: Payer: Self-pay | Admitting: Internal Medicine

## 2013-04-03 NOTE — Telephone Encounter (Signed)
rx called into pharmacy

## 2013-04-03 NOTE — Telephone Encounter (Signed)
Okay #60 x 0 

## 2013-04-07 ENCOUNTER — Telehealth: Payer: Self-pay | Admitting: Internal Medicine

## 2013-04-07 NOTE — Telephone Encounter (Signed)
[facility] left v/m requesting substitute med for hydrocodone apap 7.5 mg/750 is no longer available.Please advise.

## 2013-04-07 NOTE — Telephone Encounter (Signed)
rx called into pharmacy

## 2013-04-07 NOTE — Telephone Encounter (Signed)
Okay #30 x 1 of the temazepam #60 x 0 for hydrocodone

## 2013-04-08 MED ORDER — HYDROCODONE-ACETAMINOPHEN 7.5-325 MG PO TABS: 1.0000 | ORAL_TABLET | Freq: Every day | ORAL | Status: DC | PRN

## 2013-04-08 NOTE — Addendum Note (Signed)
Addended by: Sueanne Margarita on: 04/08/2013 01:08 PM   Modules accepted: Orders, Medications

## 2013-04-08 NOTE — Telephone Encounter (Signed)
Change to 7.5/325 if that is what they have

## 2013-04-08 NOTE — Telephone Encounter (Signed)
Spoke with pharmacist and advised changes, med list updated

## 2013-05-14 ENCOUNTER — Other Ambulatory Visit: Payer: Self-pay | Admitting: Internal Medicine

## 2013-05-14 NOTE — Telephone Encounter (Signed)
Last filled 04/03/13 [name], Please send back to me for call in

## 2013-05-15 ENCOUNTER — Other Ambulatory Visit: Payer: Self-pay | Admitting: Family Medicine

## 2013-05-15 NOTE — Telephone Encounter (Signed)
Already done

## 2013-05-15 NOTE — Telephone Encounter (Signed)
plz phone in. 

## 2013-05-15 NOTE — Telephone Encounter (Signed)
rx called into pharmacy

## 2013-06-10 ENCOUNTER — Other Ambulatory Visit: Payer: Self-pay | Admitting: Internal Medicine

## 2013-06-11 NOTE — Telephone Encounter (Signed)
Okay #30 x 0 

## 2013-06-11 NOTE — Telephone Encounter (Signed)
Rx called to pharmacy as instructed. 

## 2013-06-30 ENCOUNTER — Encounter: Payer: Self-pay | Admitting: Internal Medicine

## 2013-06-30 ENCOUNTER — Ambulatory Visit (INDEPENDENT_AMBULATORY_CARE_PROVIDER_SITE_OTHER): Payer: Managed Care, Other (non HMO) | Admitting: Internal Medicine

## 2013-06-30 VITALS — BP 112/70 | HR 60 | Temp 97.6°F | Ht 65.0 in | Wt 119.0 lb

## 2013-06-30 DIAGNOSIS — M545 Low back pain, unspecified: Secondary | ICD-10-CM

## 2013-06-30 DIAGNOSIS — F410 Panic disorder [episodic paroxysmal anxiety] without agoraphobia: Secondary | ICD-10-CM

## 2013-06-30 DIAGNOSIS — B079 Viral wart, unspecified: Secondary | ICD-10-CM

## 2013-06-30 DIAGNOSIS — K219 Gastro-esophageal reflux disease without esophagitis: Secondary | ICD-10-CM

## 2013-06-30 DIAGNOSIS — G8929 Other chronic pain: Secondary | ICD-10-CM

## 2013-06-30 DIAGNOSIS — Z0000 Encounter for general adult medical examination without abnormal findings: Secondary | ICD-10-CM

## 2013-06-30 DIAGNOSIS — Z23 Encounter for immunization: Secondary | ICD-10-CM

## 2013-06-30 LAB — CBC WITH DIFFERENTIAL/PLATELET
Basophils Relative: 0.6 % (ref 0.0–3.0)
Eosinophils Relative: 2.2 % (ref 0.0–5.0)
HCT: 39.2 % (ref 36.0–46.0)
Hemoglobin: 13.3 g/dL (ref 12.0–15.0)
Lymphs Abs: 2 10*3/uL (ref 0.7–4.0)
Monocytes Relative: 13.2 % — ABNORMAL HIGH (ref 3.0–12.0)
Neutro Abs: 2.6 10*3/uL (ref 1.4–7.7)
WBC: 5.5 10*3/uL (ref 4.5–10.5)

## 2013-06-30 LAB — HEPATIC FUNCTION PANEL
AST: 18 U/L (ref 0–37)
Albumin: 3.9 g/dL (ref 3.5–5.2)
Alkaline Phosphatase: 39 U/L (ref 39–117)
Total Protein: 7 g/dL (ref 6.0–8.3)

## 2013-06-30 LAB — BASIC METABOLIC PANEL
GFR: 79.61 mL/min (ref >60.00)
Glucose, Bld: 63 mg/dL — ABNORMAL LOW (ref 70–99)
Potassium: 4.1 mEq/L (ref 3.5–5.1)
Sodium: 138 mEq/L (ref 135–145)

## 2013-06-30 NOTE — Assessment & Plan Note (Signed)
Generally healthy Prefers no mammo or pap smear Counseling done

## 2013-06-30 NOTE — Assessment & Plan Note (Signed)
Liquid nitrogen 45 seconds x 2 Tolerated well Discussed home care 

## 2013-06-30 NOTE — Progress Notes (Signed)
Subjective:    Patient ID: Rachel Vaughn, female    DOB: [birth year], 52 y.o.   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 Will occasionally need it to get to work  Current Outpatient Prescriptions on File Prior to Visit  Medication Sig Dispense Refill  . ALPRAZolam (XANAX) 0.25 MG tablet take 1 to 2 tablets by mouth three times a day AS NEEDED FOR ANXIETY  60 tablet  0  . busPIRone (BUSPAR) 10 MG tablet Take 1 tablet (10 mg total) by mouth 2 (two) times daily.  180 tablet  3  . cetirizine (ZYRTEC) 10 MG tablet Take 10 mg by mouth daily.      Marland Kitchen HYDROcodone-acetaminophen (NORCO) 7.5-325 MG per tablet Take 1-2 tablets by mouth daily as needed for pain.  60 tablet  0  . omeprazole (PRILOSEC) 20 MG capsule Take 1 capsule (20 mg total) by mouth daily.  30 capsule  11  . temazepam (RESTORIL) 30 MG capsule take 1 capsule by mouth at bedtime AS NEEDED FOR SLEEP  30 capsule  0  . valACYclovir (VALTREX) 1000 MG tablet TAKE ONE TABLET BY MOUTH TWICE DAILY  60 tablet  0   No current facility-administered medications on file prior to visit.    Allergies  Allergen Reactions  . Fluoxetine Hcl     REACTION: unspecified  . Paroxetine     REACTION: unspecified  . Venlafaxine     REACTION: unspecified    Past Medical History  Diagnosis Date  . Diverticulitis   . Anxiety   . GERD (gastroesophageal reflux disease)   . Asthma   . Social anxiety disorder     Past Surgical History  Procedure Laterality Date  . Breast enhancement surgery   1987  . Cesarean section      Family History  Problem Relation Age of Onset  . Asthma Mother   . COPD Mother   . Diabetes Maternal Grandmother   . Cancer Neg Hx     History   Social History  . Marital Status: Married    Spouse Name: N/A    Number of Children: 2  . Years of Education: N/A   Occupational History  . [employer]-- retail work    Social History Main Topics  . Smoking status: Never Smoker   . Smokeless tobacco: Never Used  . Alcohol Use: Yes  . Drug Use: No  . Sexual Activity: Not on file   Other Topics Concern  . Not on file   Social History Narrative  . No narrative on file   Review of Systems  Constitutional: Positive for unexpected weight change. Negative for fatigue.       Appetite is better and weight up somewhat Okay for her Wears seat belt  HENT: Positive for congestion and rhinorrhea. Negative for hearing loss, dental problem and tinnitus.        Does see the dentist--looking for someone new (didn't like the first visit there)  Eyes: Positive for visual disturbance.  No diplopia or unilateral vision loss Occasionally sees "circles"--- alprazolam with the temazepam may cause this  Respiratory: Negative for cough, chest tightness and shortness of breath.   Cardiovascular: Positive for palpitations. Negative for chest pain and leg swelling.       Palps with panic  Gastrointestinal: Positive for constipation. Negative for nausea, vomiting, abdominal pain and blood in stool.       No sig heartburn Does pass a lot of gas No sugarless candies, etc Bowels generally fine--mild constipation at times  Endocrine: Negative for cold intolerance and heat intolerance.  Genitourinary: Negative for dysuria, urgency and difficulty urinating.       No sex  Musculoskeletal: Positive for back pain. Negative for joint swelling and arthralgias.  Skin: Positive for rash.       Wart on left arm Recent poison ivy---went [facility] and got [facility]   Allergic/Immunologic: Positive for environmental allergies. Negative for immunocompromised state.       Cetirizine controls  Neurological: Positive for headaches. Negative for dizziness, syncope, weakness, light-headedness and numbness.       May have AM headache after needing the pain meds---limits it  Hematological: Negative for adenopathy. Bruises/bleeds easily.  Psychiatric/Behavioral: Positive for sleep disturbance and dysphoric mood. The patient is nervous/anxious.        Variable sleep despite the med Intermittent sad feelings---not really worse       Objective:   Physical Exam  Constitutional: She is oriented to person, place, and time. She appears well-developed and well-nourished. No distress.  HENT:  Head: Normocephalic and atraumatic.  Right Ear: External ear normal.  Left Ear: External ear normal.  Mouth/Throat: Oropharynx is clear and moist. No oropharyngeal exudate.  Eyes: Conjunctivae and EOM are normal. Pupils are equal, round, and reactive to light.  Neck: Normal range of motion. Neck supple. No thyromegaly present.  Cardiovascular: Normal rate, regular rhythm, normal heart sounds and intact distal pulses.  Exam reveals no gallop.   No murmur heard. Pulmonary/Chest: Effort normal and breath sounds normal. No respiratory distress. She has no wheezes. She has no rales.  Abdominal: Soft. There is no tenderness.  Genitourinary:  Implants present though smaller on left (?leakage) No masses  Musculoskeletal: Normal range of motion. She exhibits no edema.  Lymphadenopathy:    She has no cervical adenopathy.  Neurological: She is alert and oriented to person, place, and time.  Skin: No rash noted.  4-51mm wart on left forearm  Psychiatric: She has a normal mood and affect. Her behavior is normal.  Her usual mild anxiety          Assessment & Plan:

## 2013-06-30 NOTE — Assessment & Plan Note (Signed)
Doing okay with current regimen Does well at work usually but tends to stay in otherwise

## 2013-06-30 NOTE — Addendum Note (Signed)
Addended by: Sueanne Margarita on: 06/30/2013 11:50 AM   Modules accepted: Orders

## 2013-06-30 NOTE — Assessment & Plan Note (Signed)
Controlled on the med

## 2013-06-30 NOTE — Assessment & Plan Note (Signed)
Improved Uses the hydrocodone at times

## 2013-07-02 ENCOUNTER — Other Ambulatory Visit: Payer: Self-pay | Admitting: Family Medicine

## 2013-07-02 NOTE — Telephone Encounter (Signed)
Okay #60 x 0 

## 2013-07-02 NOTE — Telephone Encounter (Signed)
rx called into pharmacy

## 2013-07-02 NOTE — Telephone Encounter (Signed)
Ok to refill? Last filled 05/15/13.

## 2013-07-03 ENCOUNTER — Other Ambulatory Visit: Payer: Self-pay | Admitting: *Deleted

## 2013-07-12 ENCOUNTER — Other Ambulatory Visit: Payer: Self-pay | Admitting: Internal Medicine

## 2013-07-13 NOTE — Telephone Encounter (Signed)
Okay #30 x 0 

## 2013-07-13 NOTE — Telephone Encounter (Signed)
rx called into pharmacy

## 2013-07-13 NOTE — Telephone Encounter (Signed)
Last filled 06/10/13

## 2013-07-17 ENCOUNTER — Other Ambulatory Visit: Payer: Self-pay | Admitting: Internal Medicine

## 2013-07-28 ENCOUNTER — Other Ambulatory Visit: Payer: Self-pay | Admitting: Internal Medicine

## 2013-07-28 NOTE — Telephone Encounter (Signed)
Last filled 07/02/13 

## 2013-07-29 NOTE — Telephone Encounter (Signed)
rx called into pharmacy

## 2013-07-29 NOTE — Telephone Encounter (Signed)
Okay #60 x 0 

## 2013-08-03 ENCOUNTER — Other Ambulatory Visit: Payer: Self-pay | Admitting: Internal Medicine

## 2013-08-03 MED ORDER — HYDROCODONE-ACETAMINOPHEN 7.5-325 MG PO TABS: 1.0000 | ORAL_TABLET | Freq: Every day | ORAL | Status: DC | PRN

## 2013-08-03 NOTE — Telephone Encounter (Signed)
Spoke with patient and advised rx ready for pick-up and it will be at the front desk.  

## 2013-08-03 NOTE — Telephone Encounter (Signed)
Last filled 04/08/13 

## 2013-08-13 ENCOUNTER — Other Ambulatory Visit: Payer: Self-pay

## 2013-08-13 MED ORDER — TEMAZEPAM 30 MG PO CAPS: ORAL_CAPSULE | ORAL | Status: DC

## 2013-08-13 MED ORDER — ALPRAZOLAM 0.25 MG PO TABS: ORAL_TABLET | ORAL | Status: DC

## 2013-08-13 NOTE — Telephone Encounter (Signed)
Pt has no insurance and pt is changing pharmacies to [facility] in [facility] [phone]. Pt request refill alprazolam and temazepam called to [facility] in [place].Please advise.

## 2013-08-13 NOTE — Telephone Encounter (Signed)
Okay #60 x 0 for alprazolam  #30 x 0 for temazepam 

## 2013-08-13 NOTE — Telephone Encounter (Signed)
Rx's called in as directed.  

## 2013-08-13 NOTE — Telephone Encounter (Signed)
Pt called back to request if possible the refill for alprazolam and temazepam be called in to pharmacy this afternoon.

## 2013-09-09 ENCOUNTER — Ambulatory Visit: Payer: Managed Care, Other (non HMO) | Admitting: Internal Medicine

## 2013-09-12 ENCOUNTER — Encounter: Payer: Self-pay | Admitting: Internal Medicine

## 2013-09-15 ENCOUNTER — Encounter: Payer: Self-pay | Admitting: Internal Medicine

## 2013-09-15 ENCOUNTER — Ambulatory Visit (INDEPENDENT_AMBULATORY_CARE_PROVIDER_SITE_OTHER): Payer: BC Managed Care – PPO | Admitting: Internal Medicine

## 2013-09-15 VITALS — BP 110/70 | HR 55 | Temp 98.6°F | Wt 119.0 lb

## 2013-09-15 DIAGNOSIS — F410 Panic disorder [episodic paroxysmal anxiety] without agoraphobia: Secondary | ICD-10-CM

## 2013-09-15 DIAGNOSIS — R079 Chest pain, unspecified: Secondary | ICD-10-CM

## 2013-09-15 MED ORDER — HYDROCODONE-ACETAMINOPHEN 7.5-325 MG PO TABS: 1.0000 | ORAL_TABLET | Freq: Every day | ORAL | Status: DC | PRN

## 2013-09-15 MED ORDER — TEMAZEPAM 30 MG PO CAPS: ORAL_CAPSULE | ORAL | Status: DC

## 2013-09-15 NOTE — Patient Instructions (Signed)
Please call [facility] to talk to a bereavement counselor [phone].

## 2013-09-15 NOTE — Assessment & Plan Note (Signed)
Seems like it was just a panic attack EKG was normal  No action needed
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
## 2013-09-15 NOTE — Progress Notes (Signed)
Pre-visit discussion using our clinic review tool. No additional management support is needed unless otherwise documented below in the visit note.  

## 2013-09-30 ENCOUNTER — Ambulatory Visit (INDEPENDENT_AMBULATORY_CARE_PROVIDER_SITE_OTHER): Payer: BC Managed Care – PPO | Admitting: Internal Medicine

## 2013-09-30 ENCOUNTER — Encounter: Payer: Self-pay | Admitting: Internal Medicine

## 2013-09-30 VITALS — BP 108/68 | HR 65 | Temp 98.0°F | Wt 120.0 lb

## 2013-09-30 DIAGNOSIS — R079 Chest pain, unspecified: Secondary | ICD-10-CM

## 2013-09-30 DIAGNOSIS — F410 Panic disorder [episodic paroxysmal anxiety] without agoraphobia: Secondary | ICD-10-CM

## 2013-09-30 NOTE — Progress Notes (Signed)
Pre-visit discussion using our clinic review tool. No additional management support is needed unless otherwise documented below in the visit note.  

## 2013-09-30 NOTE — Assessment & Plan Note (Signed)
Better but still vague symptoms Has restarted PPI in case this is heartburn

## 2013-09-30 NOTE — Assessment & Plan Note (Signed)
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

## 2013-09-30 NOTE — Progress Notes (Signed)
   Subjective:    Patient ID: Rachel Vaughn, female    DOB: 07-13-1961, 52 y.o.   MRN: [id number]  HPI Determined to feel better for Christmas Emotionally doing better today Has still been troubled Still staying in her apartment--going to daughter's for Christmas (just son there though) Does go out to shop once a week---uses the xanax then  Not thinking about dying now Christmas is important to her though not in formal religion  Did see orthopedist again Now being sent for pain evaluation Still on worker's comp  Current Outpatient Prescriptions on File Prior to Visit  Medication Sig Dispense Refill  . ALPRAZolam (XANAX) 0.25 MG tablet take 1 to 2 tablets by mouth three times a day if needed for anxiety  60 tablet  0  . busPIRone (BUSPAR) 10 MG tablet Take 10 mg by mouth 2 (two) times daily.       . cetirizine (ZYRTEC) 10 MG tablet Take 10 mg by mouth daily.      . citalopram (CELEXA) 20 MG tablet Take 10 mg by mouth 2 (two) times daily.       Marland Kitchen HYDROcodone-acetaminophen (NORCO) 7.5-325 MG per tablet Take 1-2 tablets by mouth daily as needed.  60 tablet  0  . omeprazole (PRILOSEC) 20 MG capsule Take 1 capsule (20 mg total) by mouth daily.  30 capsule  11  . temazepam (RESTORIL) 30 MG capsule take 1 capsule by mouth at bedtime  30 capsule  0  . valACYclovir (VALTREX) 1000 MG tablet Take 1,000 mg by mouth 2 (two) times daily as needed.       No current facility-administered medications on file prior to visit.    Allergies  Allergen Reactions  . Fluoxetine Hcl     REACTION: unspecified  . Paroxetine     REACTION: unspecified  . Venlafaxine     REACTION: unspecified    Past Medical History  Diagnosis Date  . Diverticulitis   . Anxiety   . GERD (gastroesophageal reflux disease)   . Asthma   . Social anxiety disorder     Past Surgical History  Procedure Laterality Date  . Breast enhancement surgery  1987  . Cesarean section      Family History  Problem Relation Age  of Onset  . Asthma Mother   . COPD Mother   . Diabetes Maternal Grandmother   . Cancer Neg Hx     History   Social History  . Marital Status: Married    Spouse Name: N/A    Number of Children: 2  . Years of Education: N/A   Occupational History  . [employer]-- retail work    Social History Main Topics  . Smoking status: Never Smoker   . Smokeless tobacco: Never Used  . Alcohol Use: Yes  . Drug Use: No  . Sexual Activity: Not on file   Other Topics Concern  . Not on file   Social History Narrative  . No narrative on file   Review of Systems Sleep is "on and off" Eating okay    Objective:   Physical Exam  Constitutional: She appears well-developed and well-nourished. No distress.  Psychiatric:  Calm and much more composed Still very anxious          Assessment & Plan:

## 2013-10-20 ENCOUNTER — Other Ambulatory Visit: Payer: Self-pay | Admitting: Internal Medicine

## 2013-10-20 NOTE — Telephone Encounter (Signed)
08/13/13 Xanax 09/15/13 Temazepam

## 2013-10-20 NOTE — Telephone Encounter (Signed)
rx called into pharmacy

## 2013-10-20 NOTE — Telephone Encounter (Signed)
Okay #60 x 0 for alprazolam  #30 x 0 for temazepam

## 2013-11-04 ENCOUNTER — Encounter: Payer: Self-pay | Admitting: Radiology

## 2013-11-05 ENCOUNTER — Encounter: Payer: Self-pay | Admitting: Internal Medicine

## 2013-11-05 ENCOUNTER — Ambulatory Visit (INDEPENDENT_AMBULATORY_CARE_PROVIDER_SITE_OTHER): Payer: BC Managed Care – PPO | Admitting: Internal Medicine

## 2013-11-05 ENCOUNTER — Encounter: Payer: Self-pay | Admitting: Radiology

## 2013-11-05 VITALS — BP 118/70 | HR 66 | Temp 98.4°F | Wt 122.0 lb

## 2013-11-05 DIAGNOSIS — F410 Panic disorder [episodic paroxysmal anxiety] without agoraphobia: Secondary | ICD-10-CM

## 2013-11-05 MED ORDER — HYDROCODONE-ACETAMINOPHEN 7.5-325 MG PO TABS: 1.0000 | ORAL_TABLET | Freq: Every day | ORAL | Status: DC | PRN

## 2013-11-05 NOTE — Progress Notes (Signed)
Subjective:    Patient ID: Rachel Vaughn, female    DOB: 1961/01/01, 53 y.o.   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 ALPRAZolam (XANAX) 0.25 MG tablet take 1 to 2 tablets by mouth three times a day if needed for anxiety  60 tablet  0  . busPIRone (BUSPAR) 10 MG tablet Take 10 mg by mouth 2 (two) times daily.       . cetirizine (ZYRTEC) 10 MG tablet Take 10 mg by mouth daily.      . citalopram (CELEXA) 20 MG tablet Take 10 mg by mouth 2 (two) times daily.       Marland Kitchen. HYDROcodone-acetaminophen (NORCO) 7.5-325 MG per tablet Take 1-2 tablets by mouth daily as needed.  60 tablet  0  . omeprazole (PRILOSEC) 20 MG capsule Take 1 capsule (20 mg total) by mouth daily.  30 capsule  11  . temazepam (RESTORIL) 30 MG capsule take 1 capsule by mouth at bedtime  30 capsule  0  . valACYclovir (VALTREX) 1000 MG tablet Take 1,000 mg by mouth 2 (two) times daily as needed.       No current facility-administered medications on file prior to visit.    Allergies  Allergen Reactions  . Fluoxetine Hcl     REACTION: unspecified  . Paroxetine     REACTION: unspecified  . Venlafaxine     REACTION: unspecified    Past Medical History  Diagnosis Date  . Diverticulitis   . Anxiety   . GERD (gastroesophageal reflux disease)   . Asthma   . Social anxiety disorder     Past Surgical History  Procedure Laterality  Date  . Breast enhancement surgery  1987  . Cesarean section      Family History  Problem Relation Age of Onset  . Asthma Mother   . COPD Mother   . Diabetes Maternal Grandmother   . Cancer Neg Hx     History   Social History  . Marital Status: Married    Spouse Name: N/A    Number of Children: 2  . Years of Education: N/A   Occupational History  . [employer]-- retail work    Social History Main Topics  . Smoking status: Never Smoker   . Smokeless tobacco: Never Used  . Alcohol Use: Yes  . Drug Use: No  . Sexual Activity: Not on file   Other Topics Concern  . Not on file   Social History Narrative  . No narrative on file   Review of Systems Still has some of the chest pain---fairly steady Appetite is good Swallows okay Sleep is okay with the temazepam    Objective:   Physical Exam  Constitutional: She appears well-developed and well-nourished. No distress.  Psychiatric: She has a normal mood and affect. Her behavior is normal.  Assessment & Plan:

## 2013-11-05 NOTE — Patient Instructions (Signed)
[facility]

## 2013-11-05 NOTE — Progress Notes (Signed)
Pre-visit discussion using our clinic review tool. No additional management support is needed unless otherwise documented below in the visit note.  

## 2013-11-05 NOTE — Assessment & Plan Note (Signed)
Ongoing symptoms We discussed her trying to get out of her apartment a little more Not even close to the possibility of finding another job She appears to really be disabled due to this panic syndrome--this sounds appropriate (then maybe she could get a very part time job for [employer] but that wouldn't get her into relapse again)

## 2013-11-18 ENCOUNTER — Other Ambulatory Visit: Payer: Self-pay | Admitting: Internal Medicine

## 2013-11-19 NOTE — Telephone Encounter (Signed)
Okay #30 x 0 

## 2013-11-19 NOTE — Telephone Encounter (Signed)
rx called into pharmacy

## 2013-11-19 NOTE — Telephone Encounter (Signed)
Last filled 10/20/13 

## 2013-12-04 ENCOUNTER — Encounter: Payer: Self-pay | Admitting: Internal Medicine

## 2013-12-20 ENCOUNTER — Other Ambulatory Visit: Payer: Self-pay | Admitting: Internal Medicine

## 2013-12-21 ENCOUNTER — Other Ambulatory Visit: Payer: Self-pay | Admitting: Internal Medicine

## 2013-12-21 NOTE — Telephone Encounter (Signed)
Received refill request electronically. Last refill 10/20/13. Is it okay to refill medication?

## 2013-12-22 NOTE — Telephone Encounter (Signed)
rx called into pharmacy

## 2013-12-22 NOTE — Telephone Encounter (Signed)
Okay #60 x 0 

## 2013-12-22 NOTE — Telephone Encounter (Signed)
Okay #30 x 0 

## 2013-12-22 NOTE — Telephone Encounter (Signed)
10/20/2013 

## 2013-12-28 ENCOUNTER — Ambulatory Visit (INDEPENDENT_AMBULATORY_CARE_PROVIDER_SITE_OTHER): Payer: BC Managed Care – PPO | Admitting: Internal Medicine

## 2013-12-28 ENCOUNTER — Encounter: Payer: Self-pay | Admitting: Internal Medicine

## 2013-12-28 VITALS — BP 108/60 | HR 58 | Temp 98.2°F | Wt 120.0 lb

## 2013-12-28 DIAGNOSIS — F410 Panic disorder [episodic paroxysmal anxiety] without agoraphobia: Secondary | ICD-10-CM

## 2013-12-28 MED ORDER — HYDROCODONE-ACETAMINOPHEN 7.5-325 MG PO TABS: 1.0000 | ORAL_TABLET | Freq: Every day | ORAL | Status: DC | PRN

## 2013-12-28 NOTE — Assessment & Plan Note (Signed)
Has settled back into a routine Remains disabled---cannot go out to work at this point Awaiting SSI evaluation No changes in meds

## 2013-12-28 NOTE — Progress Notes (Signed)
   Subjective:    Patient ID: Rachel Vaughn, female    DOB: 11/18/1960, 53 y.o.   MRN: [id number]  HPI Doing "okay" She is satisfied with status Has been out doing yard work regularly To store once a week---and ordering stuff on line Still does the babysitting occasionally  Not spending all day in bed---despite the urge to Very regimented in her routine--gets comfort in that  Her attorney is working on settlement for the [employer] comp situation Going to PT --but stopped (now doing self directed exercises) Has appt through the SSI system---disability is pending  No regular panic attacks Controlled with her strict routine and isolation  Current Outpatient Prescriptions on File Prior to Visit  Medication Sig Dispense Refill  . ALPRAZolam (XANAX) 0.25 MG tablet take 1 to 2 tablets by mouth three times a day if needed for anxiety  60 tablet  0  . busPIRone (BUSPAR) 10 MG tablet Take 10 mg by mouth 2 (two) times daily.       . cetirizine (ZYRTEC) 10 MG tablet Take 10 mg by mouth daily.      . citalopram (CELEXA) 20 MG tablet Take 10 mg by mouth 2 (two) times daily.       Marland Kitchen. HYDROcodone-acetaminophen (NORCO) 7.5-325 MG per tablet Take 1-2 tablets by mouth daily as needed.  60 tablet  0  . omeprazole (PRILOSEC) 20 MG capsule Take 1 capsule (20 mg total) by mouth daily.  30 capsule  11  . temazepam (RESTORIL) 30 MG capsule take 1 capsule by mouth at bedtime  30 capsule  0  . valACYclovir (VALTREX) 1000 MG tablet Take 1,000 mg by mouth 2 (two) times daily as needed.       No current facility-administered medications on file prior to visit.    Allergies  Allergen Reactions  . Fluoxetine Hcl     REACTION: unspecified  . Paroxetine     REACTION: unspecified  . Venlafaxine     REACTION: unspecified    Past Medical History  Diagnosis Date  . Diverticulitis   . Anxiety   . GERD (gastroesophageal reflux disease)   . Asthma   . Social anxiety disorder     Past Surgical History    Procedure Laterality Date  . Breast enhancement surgery  1987  . Cesarean section      Family History  Problem Relation Age of Onset  . Asthma Mother   . COPD Mother   . Diabetes Maternal Grandmother   . Cancer Neg Hx     History   Social History  . Marital Status: Married    Spouse Name: N/A    Number of Children: 2  . Years of Education: N/A   Occupational History  . [employer]-- retail work    Social History Main Topics  . Smoking status: Never Smoker   . Smokeless tobacco: Never Used  . Alcohol Use: Yes  . Drug Use: No  . Sexual Activity: Not on file   Other Topics Concern  . Not on file   Social History Narrative  . No narrative on file   Review of Systems Frequent awakening at night --- occasional daytime tiredness Appetite is fine Weight about the same    Objective:   Physical Exam  Psychiatric:  Normal appearance Speech is normal and not pressured. Not depressed          Assessment & Plan:

## 2013-12-28 NOTE — Progress Notes (Signed)
Pre visit review using our clinic review tool, if applicable. No additional management support is needed unless otherwise documented below in the visit note. 

## 2014-01-13 ENCOUNTER — Encounter: Payer: Self-pay | Admitting: Internal Medicine

## 2014-01-19 ENCOUNTER — Other Ambulatory Visit: Payer: Self-pay | Admitting: Internal Medicine

## 2014-01-19 NOTE — Telephone Encounter (Signed)
[name], Please send back to me for call in  

## 2014-01-20 NOTE — Telephone Encounter (Signed)
rx called into pharmacy

## 2014-01-20 NOTE — Telephone Encounter (Signed)
Please call in

## 2014-02-18 ENCOUNTER — Other Ambulatory Visit: Payer: Self-pay | Admitting: Internal Medicine

## 2014-02-18 ENCOUNTER — Other Ambulatory Visit: Payer: Self-pay | Admitting: Family Medicine

## 2014-02-18 NOTE — Telephone Encounter (Signed)
Rout to PCP 

## 2014-02-18 NOTE — Telephone Encounter (Signed)
12/28/13 

## 2014-02-19 MED ORDER — HYDROCODONE-ACETAMINOPHEN 7.5-325 MG PO TABS: 1.0000 | ORAL_TABLET | Freq: Every day | ORAL | Status: DC | PRN

## 2014-02-19 NOTE — Telephone Encounter (Signed)
rx sent to pharmacy by e-script rx called into pharmacy  

## 2014-02-19 NOTE — Telephone Encounter (Signed)
rx called into pharmacy

## 2014-02-19 NOTE — Telephone Encounter (Signed)
Okay #30 x 0 

## 2014-02-19 NOTE — Telephone Encounter (Signed)
Okay citalopram for a year Alprazolam #60 x 0

## 2014-03-21 ENCOUNTER — Other Ambulatory Visit: Payer: Self-pay | Admitting: Internal Medicine

## 2014-03-22 NOTE — Telephone Encounter (Signed)
plz phone in. 

## 2014-03-23 NOTE — Telephone Encounter (Signed)
rx called into pharmacy

## 2014-04-19 ENCOUNTER — Other Ambulatory Visit: Payer: Self-pay | Admitting: Family Medicine

## 2014-04-19 ENCOUNTER — Other Ambulatory Visit: Payer: Self-pay | Admitting: Internal Medicine

## 2014-04-20 ENCOUNTER — Other Ambulatory Visit: Payer: Self-pay | Admitting: *Deleted

## 2014-04-20 MED ORDER — TEMAZEPAM 30 MG PO CAPS: 30.0000 mg | ORAL_CAPSULE | Freq: Every day | ORAL | Status: DC

## 2014-04-20 MED ORDER — ALPRAZOLAM 0.25 MG PO TABS: 0.2500 mg | ORAL_TABLET | Freq: Three times a day (TID) | ORAL | Status: DC | PRN

## 2014-04-20 MED ORDER — HYDROCODONE-ACETAMINOPHEN 7.5-325 MG PO TABS: 1.0000 | ORAL_TABLET | Freq: Every day | ORAL | Status: DC | PRN

## 2014-04-20 NOTE — Telephone Encounter (Signed)
03/22/14 xanax and temazepam 02/19/14 norco

## 2014-04-20 NOTE — Telephone Encounter (Signed)
Sent patient message back thru my-chart, that prescription is ready for pick-up and will be at the front desk.  

## 2014-04-22 ENCOUNTER — Other Ambulatory Visit: Payer: Self-pay | Admitting: Family Medicine

## 2014-04-22 NOTE — Telephone Encounter (Signed)
These were both just approved on 7/14 Please find out what happened

## 2014-04-22 NOTE — Telephone Encounter (Signed)
Electronic refill request, please advise  

## 2014-04-23 NOTE — Telephone Encounter (Signed)
Yes there is a note from 04/19/14 stating that Rx is in front office for pick up

## 2014-05-03 ENCOUNTER — Telehealth: Payer: Self-pay | Admitting: Internal Medicine

## 2014-05-03 ENCOUNTER — Ambulatory Visit (INDEPENDENT_AMBULATORY_CARE_PROVIDER_SITE_OTHER): Payer: BC Managed Care – PPO | Admitting: Internal Medicine

## 2014-05-03 ENCOUNTER — Encounter: Payer: Self-pay | Admitting: Internal Medicine

## 2014-05-03 VITALS — BP 100/68 | HR 64 | Temp 98.0°F | Wt 120.0 lb

## 2014-05-03 DIAGNOSIS — J019 Acute sinusitis, unspecified: Secondary | ICD-10-CM | POA: Insufficient documentation

## 2014-05-03 DIAGNOSIS — F411 Generalized anxiety disorder: Secondary | ICD-10-CM

## 2014-05-03 DIAGNOSIS — J018 Other acute sinusitis: Secondary | ICD-10-CM

## 2014-05-03 MED ORDER — AMOXICILLIN 500 MG PO TABS: 1000.0000 mg | ORAL_TABLET | Freq: Two times a day (BID) | ORAL | Status: DC

## 2014-05-03 NOTE — Assessment & Plan Note (Signed)
Still debilitating and disabled Doing well with new counselor--- developing therapeutic relationship now Will continue meds

## 2014-05-03 NOTE — Telephone Encounter (Signed)
Should be okay to wait for this afternoon

## 2014-05-03 NOTE — Telephone Encounter (Signed)
Patient Information:  Caller Name: Massie BougieBelinda  Phone: [phone]  Patient: Rachel Vaughn, Rachel Vaughn  Gender: Female  DOB: Nov 08, 1960  Age: 53 Years  PCP: Tillman AbideLetvak , [facility](Family Practice)  Pregnant: No  Office Follow Up:  Does the office need to follow up with this patient?: No  Instructions For The Office: N/A  RN Note:  Appt scheduled with Dr.Letvak MD today . Care advice and call back parameters reviewed. Understanding expressed.  Symptoms  Reason For Call & Symptoms: Patient states she feels like she has (1) sinus infection, + green drainage , sore throat, ear congestion and headache.  in the morning onset 2 weeks. Accompanied by fatigue and body aches.  (2)  Tick bite to left buttock , able to scratch it off. Home treatment antibiotic ointment. Onset- 2 weeks ago.  Unable to see area due to location.  She can feel a bump and see a red spot in the mirror.   Has not checked fever but "feels feverish" at times  Reviewed Health History In EMR: Yes  Reviewed Medications In EMR: Yes  Reviewed Allergies In EMR: Yes  Reviewed Surgeries / Procedures: Yes  Date of Onset of Symptoms: 04/19/2014  Treatments Tried: zyrtec daily, antibacterial ointment  Treatments Tried Worked: No OB / GYN:  LMP: 02/01/2014  Guideline(s) Used:  Sinus Pain and Congestion  Tick Bite  Disposition Per Guideline:   See Today or Tomorrow in Office  Reason For Disposition Reached:   Sinus congestion (pressure, fullness) present > 10 days  Advice Given:  For a Stuffy Nose - Use Nasal Washes:  Introduction: Saline (salt water) nasal irrigation (nasal wash) is an effective and simple home remedy for treating stuffy nose and sinus congestion. The nose can be irrigated by pouring, spraying, or squirting salt water into the nose and then letting it run back out.  How it Helps: The salt water rinses out excess mucus, washes out any irritants (dust, allergens) that might be present, and moistens the nasal cavity.  Methods:  There are several ways to perform nasal irrigation. You can use a saline nasal spray bottle (available over-the-counter), a rubber ear syringe, a medical syringe without the needle, or a Neti Pot.  Step-By-Step Instructions:   Step 1: Lean over a sink.  Step 2: Gently squirt or spray warm salt water into one of your nostrils.  Step 3: Some of the water may run into the back of your throat. Spit this out. If you swallow the salt water it will not hurt you.  Step 4: Blow your nose to clean out the water and mucus.  Step 5: Repeat steps 1-4 for the other nostril. You can do this a couple times a day if it seems to help you.  How to Make Saline [facility](Salt Water) Nasal Wash :  You can make your own saline nasal wash.  Add 1/2 tsp of table salt to 1 cup (8 oz; 240 ml) of warm water.  You should use sterile, distilled, or previously boiled water for nasal irrigation.  Pain and Fever Medicines:  For pain or fever relief, take either acetaminophen or ibuprofen.  They are over-the-counter (OTC) drugs that help treat both fever and pain. You can buy them at the drugstore.  Acetaminophen (e.g., Tylenol):  Regular Strength Tylenol: Take 650 mg (two 325 mg pills) by mouth every 4-6 hours as needed. Each Regular Strength Tylenol pill has 325 mg of acetaminophen.  Ibuprofen (e.g., Motrin, Advil):  Take 400 mg (two 200 mg  pills) by mouth every 6 hours.  Call Back If:   You become worse.  Call Back If:  Fever or rash occur in the next 2 weeks  Bite begins to look infected  You become worse.  Antibiotic Ointment:  Wash the wound and your hands with soap and water after removal to prevent catching any tick disease. Apply an over-the-counter antibiotic ointment (e.g., bacitracin) to the bite once.  Patient Will Follow Care Advice:  YES  Appointment Scheduled:  05/03/2014 16:15:00 Appointment Scheduled Provider:  Tillman Abide [facility])

## 2014-05-03 NOTE — Progress Notes (Signed)
Pre visit review using our clinic review tool, if applicable. No additional management support is needed unless otherwise documented below in the visit note. 

## 2014-05-03 NOTE — Assessment & Plan Note (Signed)
Sick for over 2 weeks Will go ahead and treat with amoxil Analgesics prn

## 2014-05-03 NOTE — Progress Notes (Signed)
Subjective:    Patient ID: Rachel Vaughn, female    DOB: Jun 06, 1961, 53 y.o.   MRN: [id number]  HPI Now seeing psychologist---"wonderful .... 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 ALPRAZolam (XANAX) 0.25 MG tablet Take 1-2 tablets (0.25-0.5 mg total) by mouth 3 (three) times daily as needed for anxiety.  60 tablet  0  . busPIRone (BUSPAR) 10 MG tablet Take 10 mg by mouth 2 (two) times daily.       . cetirizine (ZYRTEC) 10 MG tablet Take 10 mg by mouth daily.      . citalopram (CELEXA) 20 MG tablet Take 1 tablet (20 mg total) by mouth daily.  90 tablet  3  . HYDROcodone-acetaminophen (NORCO) 7.5-325 MG per tablet Take 1-2 tablets by mouth daily as needed.  60 tablet  0  . omeprazole (PRILOSEC) 20 MG capsule Take 1 capsule (20 mg total) by mouth daily.  30 capsule  11  . temazepam (RESTORIL) 30 MG capsule Take 1 capsule (30 mg total) by mouth at bedtime.  30  capsule  0  . valACYclovir (VALTREX) 1000 MG tablet Take 1,000 mg by mouth 2 (two) times daily as needed.       No current facility-administered medications on file prior to visit.    Allergies  Allergen Reactions  . Fluoxetine Hcl     REACTION: unspecified  . Paroxetine     REACTION: unspecified  . Venlafaxine     REACTION: unspecified    Past Medical History  Diagnosis Date  . Diverticulitis   . Anxiety   . GERD (gastroesophageal reflux disease)   . Asthma   . Social anxiety disorder     Past Surgical History  Procedure Laterality Date  . Breast enhancement surgery  1987  . Cesarean section      Family History  Problem Relation Age of Onset  . Asthma Mother   . COPD Mother   . Diabetes Maternal Grandmother   . Cancer Neg Hx     History   Social History  . Marital Status: Married    Spouse Name: N/A    Number of Children: 2  . Years of Education: N/A   Occupational History  . [employer]-- retail work    Social History Main Topics  . Smoking status: Never Smoker   . Smokeless tobacco: Never Used  . Alcohol Use: Yes  . Drug Use:  No  . Sexual Activity: Not on file   Other Topics Concern  . Not on file   Social History Narrative  . No narrative on file   Review of Systems No vomiting or diarrhea Appetite is okay No rash Feels she is having more trouble emptying bladder    Objective:   Physical Exam  Constitutional: She appears well-developed and well-nourished. No distress.  HENT:  Mouth/Throat: Oropharynx is clear and moist. No oropharyngeal exudate.  Mild frontal tenderness Mild nasal inflammation TMs normal   Neck: Normal range of motion. Neck supple. No thyromegaly present.  Pulmonary/Chest: Effort normal and breath sounds normal. No respiratory distress. She has no wheezes. She has no rales.  Lymphadenopathy:    She has no cervical adenopathy.  Skin:  Small non inflamed nodule on upper right buttock--reassured  Psychiatric: She  has a normal mood and affect. Her behavior is normal.          Assessment & Plan:

## 2014-05-24 ENCOUNTER — Other Ambulatory Visit: Payer: Self-pay | Admitting: Internal Medicine

## 2014-05-24 NOTE — Telephone Encounter (Signed)
04/20/14 

## 2014-05-24 NOTE — Telephone Encounter (Signed)
Okay #30 x 0 

## 2014-05-25 NOTE — Telephone Encounter (Signed)
Rx called in to pharmacy. 

## 2014-06-07 ENCOUNTER — Other Ambulatory Visit: Payer: Self-pay | Admitting: Internal Medicine

## 2014-06-07 NOTE — Telephone Encounter (Signed)
04/20/14 

## 2014-06-07 NOTE — Telephone Encounter (Signed)
Okay #60 x 0 

## 2014-06-08 NOTE — Telephone Encounter (Signed)
rx called into pharmacy

## 2014-06-22 ENCOUNTER — Telehealth: Payer: Self-pay | Admitting: *Deleted

## 2014-06-22 NOTE — Telephone Encounter (Signed)
Call from Dr. Lorenza Chick at [facility], pt came to the ED couple days ago and was admitted to the psych unit, the physician would like to discuss her care with you. [phone]

## 2014-06-23 NOTE — Telephone Encounter (Signed)
This is a psychiatry resident there. Patient had called Crisis Line, and they called 911 She forgot she called May have taken extra benzos (temazepam &/or alprazolam) with some alcohol Denies suicidal intent  Now being observed Both benzos on hold for now  Will be referred to psychiatry---explained that she has failed to stay with others Agree with decreased meds---but with severity of her panic, recommended stopping the temazepam and having alprazolam for prn use May be released today I gave appt for Friday at 11AM---he will let her know

## 2014-06-25 ENCOUNTER — Encounter: Payer: Self-pay | Admitting: Internal Medicine

## 2014-06-25 ENCOUNTER — Ambulatory Visit (INDEPENDENT_AMBULATORY_CARE_PROVIDER_SITE_OTHER): Payer: BC Managed Care – PPO | Admitting: Internal Medicine

## 2014-06-25 VITALS — BP 100/60 | HR 65 | Temp 98.5°F | Wt 115.0 lb

## 2014-06-25 DIAGNOSIS — F410 Panic disorder [episodic paroxysmal anxiety] without agoraphobia: Secondary | ICD-10-CM

## 2014-06-25 DIAGNOSIS — Z23 Encounter for immunization: Secondary | ICD-10-CM

## 2014-06-25 NOTE — Addendum Note (Signed)
Addended by: Sueanne Margarita on: 06/25/2014 02:41 PM   Modules accepted: Orders

## 2014-06-25 NOTE — Assessment & Plan Note (Signed)
Reviewed her hospital course and discussed with the resident while she was there No suicidal thought or intent Remorseful about the trouble she caused Ready to try to wean off some of her meds---will stop the temazepam Alprazolam only for extreme emergency. Will only fill #20 at a time from now on Continue with Dr Ryan--this has been very helpful for her Continue the citalopram and buspirone Counseled for over half of 25 minute visit

## 2014-06-25 NOTE — Progress Notes (Signed)
Subjective:    Patient ID: Rachel Vaughn, female    DOB: [birth year], 53 y.o.   MRN: [id number]  HPI Had been having some anxiety 7 days ago Was supposed to go and see son---and she couldn't get herself to go out Spent the whole day detailing his car and really enjoyed this Didn't take the alprazolam that day Took the 2 hydrocodone for her back Decided to have a couple of beers---drank these while still working on the car Had supper--may have had another beer Daughter brought honey flavored whiskey--had 1-2 shots of this with dinner Went upstairs to take shower--and doesn't remember anything after that May have taken temazepam or 2 around time of shower She called hotline---must have been aware that she was in trouble Awoke in psych ER the next day Never had any suicidal thoughts--and doesn't have any now  embarased now. 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 (in [place]) Saw him yesterday-- he is her psychologist Has been working towards trying to decrease some of her meds  Current Outpatient Prescriptions on File Prior to Visit  Medication Sig Dispense Refill  . ALPRAZolam (XANAX) 0.25 MG tablet TAKE 1 TO 2 TABLETS BY MOUTH THREE TIMES DAILY AS NEEDED FOR ANXIETY  60 tablet  0  . busPIRone (BUSPAR) 10 MG tablet Take 10 mg by mouth 2 (two) times daily.       . cetirizine (ZYRTEC) 10 MG tablet Take 10 mg by mouth daily.      . citalopram (CELEXA) 20 MG tablet Take 1 tablet (20 mg total) by mouth daily.  90 tablet  3  . HYDROcodone-acetaminophen (NORCO) 7.5-325 MG per tablet Take 1-2 tablets by mouth daily as needed.  60 tablet  0  . omeprazole (PRILOSEC) 20 MG capsule Take 1 capsule (20 mg total) by mouth daily.  30 capsule  11  . temazepam (RESTORIL) 30 MG capsule take 1 capsule by mouth at bedtime  30 capsule  0  . valACYclovir (VALTREX) 1000 MG tablet Take 1,000 mg by mouth 2 (two) times daily as needed.       No current  facility-administered medications on file prior to visit.    Allergies  Allergen Reactions  . Extract Of [employer]   . Fluoxetine Hcl     REACTION: unspecified  . Paroxetine     REACTION: unspecified  . Venlafaxine     REACTION: unspecified    Past Medical History  Diagnosis Date  . Diverticulitis   . Anxiety   . GERD (gastroesophageal reflux disease)   . Asthma   . Social anxiety disorder     Past Surgical History  Procedure Laterality Date  . Breast enhancement surgery  1987  . Cesarean section      Family History  Problem Relation Age of Onset  . Asthma Mother   . COPD Mother   . Diabetes Maternal Grandmother   . Cancer Neg Hx     History   Social History  . Marital Status: Married    Spouse Name: N/A    Number of Children: 2  . Years of Education: N/A   Occupational History  . [employer]-- retail work    Social History Main Topics  . Smoking status: Never Smoker   . Smokeless tobacco: Never Used  . Alcohol Use: Yes  . Drug Use: No  . Sexual Activity: Not on file   Other Topics Concern  . Not on file  Social History Narrative  . No narrative on file   Review of Systems Off the temazepam and has been sleeping okay Had been regularly taking the alprazolam--doesn't think the prn is a good idea for her Eating okay again but has lost 5# (couldn't eat while in the hospital)    Objective:   Physical Exam  Psychiatric: She has a normal mood and affect. Her behavior is normal.  Usual engagement with normal conversation          Assessment & Plan:

## 2014-06-25 NOTE — Progress Notes (Signed)
Pre visit review using our clinic review tool, if applicable. No additional management support is needed unless otherwise documented below in the visit note. 

## 2014-07-05 ENCOUNTER — Encounter: Payer: Self-pay | Admitting: Internal Medicine

## 2014-07-05 ENCOUNTER — Ambulatory Visit (INDEPENDENT_AMBULATORY_CARE_PROVIDER_SITE_OTHER): Payer: BC Managed Care – PPO | Admitting: Internal Medicine

## 2014-07-05 ENCOUNTER — Other Ambulatory Visit (HOSPITAL_COMMUNITY)
Admission: RE | Admit: 2014-07-05 | Discharge: 2014-07-05 | Disposition: A | Discharge status: Home / Self Care | Payer: BC Managed Care – PPO | Source: Ambulatory Visit | Attending: Internal Medicine | Admitting: Internal Medicine

## 2014-07-05 VITALS — BP 110/70 | HR 73 | Temp 97.9°F | Ht 65.5 in | Wt 116.5 lb

## 2014-07-05 DIAGNOSIS — Z124 Encounter for screening for malignant neoplasm of cervix: Secondary | ICD-10-CM

## 2014-07-05 DIAGNOSIS — Z1151 Encounter for screening for human papillomavirus (HPV): Secondary | ICD-10-CM | POA: Insufficient documentation

## 2014-07-05 DIAGNOSIS — M545 Low back pain, unspecified: Secondary | ICD-10-CM

## 2014-07-05 DIAGNOSIS — Z0000 Encounter for general adult medical examination without abnormal findings: Secondary | ICD-10-CM

## 2014-07-05 DIAGNOSIS — F410 Panic disorder [episodic paroxysmal anxiety] without agoraphobia: Secondary | ICD-10-CM

## 2014-07-05 DIAGNOSIS — L255 Unspecified contact dermatitis due to plants, except food: Secondary | ICD-10-CM | POA: Insufficient documentation

## 2014-07-05 DIAGNOSIS — Z01419 Encounter for gynecological examination (general) (routine) without abnormal findings: Secondary | ICD-10-CM | POA: Diagnosis present

## 2014-07-05 DIAGNOSIS — G8929 Other chronic pain: Secondary | ICD-10-CM

## 2014-07-05 MED ORDER — TRIAMCINOLONE ACETONIDE 0.1 % EX CREA: 1.0000 "application " | TOPICAL_CREAM | Freq: Two times a day (BID) | CUTANEOUS | Status: DC | PRN

## 2014-07-05 MED ORDER — HYDROXYZINE PAMOATE 25 MG PO CAPS: 25.0000 mg | ORAL_CAPSULE | Freq: Every evening | ORAL | Status: DC | PRN

## 2014-07-05 NOTE — Progress Notes (Signed)
Pre visit review using our clinic review tool, if applicable. No additional management support is needed unless otherwise documented below in the visit note. 

## 2014-07-05 NOTE — Assessment & Plan Note (Signed)
Severe On buspar and citalopram Alprazolam for emergencies only

## 2014-07-05 NOTE — Assessment & Plan Note (Signed)
Known disc disease  Has the hydrocodone for severe pain

## 2014-07-05 NOTE — Addendum Note (Signed)
Addended by: Patience Musca on: 07/05/2014 04:46 PM   Modules accepted: Orders

## 2014-07-05 NOTE — Patient Instructions (Signed)
Please set up your screening mammogram. 

## 2014-07-05 NOTE — Assessment & Plan Note (Signed)
She will set up mammogram Pap done Not due yet for colon

## 2014-07-05 NOTE — Progress Notes (Signed)
Subjective:    Patient ID: Rachel Vaughn, female    DOB: [birth year], 53 y.o.   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 busPIRone (BUSPAR) 10 MG tablet Take 10 mg by mouth 2 (two) times daily.       . cetirizine (ZYRTEC) 10 MG tablet Take 10 mg by mouth daily.      . citalopram (CELEXA) 20 MG tablet Take 1 tablet (20 mg total) by mouth daily.  90 tablet  3  . ALPRAZolam (XANAX) 0.25 MG tablet TAKE TABLET BY MOUTH THREE TIMES DAILY AS NEEDED FOR ANXIETY      . HYDROcodone-acetaminophen (NORCO) 7.5-325 MG per tablet Take 1-2 tablets by mouth daily as needed.  60 tablet  0  . omeprazole (PRILOSEC) 20 MG capsule Take 1 capsule (20 mg total) by mouth daily.  30 capsule  11  . valACYclovir (VALTREX) 1000 MG tablet Take 1,000 mg by mouth 2 (two) times daily as needed.       No current facility-administered medications on file prior to visit.    Allergies  Allergen Reactions  . Extract Of [employer]   . Fluoxetine Hcl     REACTION: unspecified  . Paroxetine     REACTION: unspecified  . Venlafaxine     REACTION: unspecified    Past Medical History  Diagnosis Date  . Diverticulitis     . Anxiety   . GERD (gastroesophageal reflux disease)   . Asthma   . Social anxiety disorder     Past Surgical History  Procedure Laterality Date  . Breast enhancement surgery  1987  . Cesarean section      Family History  Problem Relation Age of Onset  . Asthma Mother   . COPD Mother   . Diabetes Maternal Grandmother   . Cancer Neg Hx     History   Social History  . Marital Status: Married    Spouse Name: N/A    Number of Children: 2  . Years of Education: N/A   Occupational History  . [employer]-- retail work    Social History Main Topics  . Smoking status: Never Smoker   . Smokeless tobacco: Never Used  . Alcohol Use: Yes  . Drug Use: No  . Sexual Activity: Not on file   Other Topics Concern  . Not on file   Social History Narrative  . No narrative on file   Review of Systems  Constitutional: Negative for fatigue and unexpected weight change.       Wears seat belt  HENT: Negative for hearing loss and tinnitus.        Regular with dentist  Eyes:  Negative for visual disturbance.       No diplopia or unilateral vision loss  Respiratory: Positive for shortness of breath. Negative for cough and chest tightness.        SOB with panic  Cardiovascular: Positive for chest pain and palpitations. Negative for leg swelling.  Gastrointestinal: Positive for blood in stool. Negative for nausea, vomiting, abdominal pain and constipation.       Will get blood on toilet paper  Endocrine: Negative for polydipsia and polyuria.  Genitourinary: Negative for dysuria, hematuria and difficulty urinating.       Some night sweats---as soon as she lies down. None during the day  LMP 4/15  Musculoskeletal: Positive for arthralgias and back pain. Negative for joint swelling.  Skin: Positive for rash.  Allergic/Immunologic: Negative for immunocompromised state.  Neurological: Positive for weakness and headaches. Negative for dizziness, syncope and light-headedness.        Regular headaches coming off the temazepam Wrist weakness just recently  Hematological: Negative for adenopathy. Does not bruise/bleed easily.  Psychiatric/Behavioral: Positive for sleep disturbance. Negative for dysphoric mood. The patient is nervous/anxious.        Objective:   Physical Exam  Constitutional: She is oriented to person, place, and time. She appears well-developed and well-nourished. No distress.  HENT:  Head: Normocephalic and atraumatic.  Right Ear: External ear normal.  Left Ear: External ear normal.  Mouth/Throat: Oropharynx is clear and moist. No oropharyngeal exudate.  Eyes: Conjunctivae and EOM are normal. Pupils are equal, round, and reactive to light.  Neck: Normal range of motion. Neck supple. No thyromegaly present.  Cardiovascular: Normal rate, regular rhythm, normal heart sounds and intact distal pulses.  Exam reveals no gallop.   No murmur heard. Pulmonary/Chest: Effort normal and breath sounds normal. No respiratory distress. She has no wheezes. She has no rales.  Abdominal: Soft. She exhibits no distension. There is no tenderness. There is no rebound and no guarding.  Genitourinary:  Breasts have implants but no masses or tenderness Mild vaginal atrophy but no lesions Cervix appears normal Pap done Limited bimanual negative  Musculoskeletal: She exhibits no edema and no tenderness.  Lymphadenopathy:    She has no cervical adenopathy.  Neurological: She is alert and oriented to person, place, and time.  Skin: No erythema.  Classic papulovesicular rash on backs of both calves  Psychiatric: Her behavior is normal.  Slightly anxious but better today---back to her usual          Assessment & Plan:

## 2014-07-05 NOTE — Assessment & Plan Note (Signed)
Will treat with cortisone cream Prednisone only if worsens Hydroxyzine to try for itching and sleep

## 2014-07-06 LAB — CBC WITH DIFFERENTIAL/PLATELET
BASOS ABS: 0 10*3/uL (ref 0.0–0.1)
Basophils Relative: 0.4 % (ref 0.0–3.0)
EOS PCT: 1.2 % (ref 0.0–5.0)
Eosinophils Absolute: 0.1 10*3/uL (ref 0.0–0.7)
HCT: 44.1 % (ref 36.0–46.0)
Hemoglobin: 14.9 g/dL (ref 12.0–15.0)
Lymphocytes Relative: 25.7 % (ref 12.0–46.0)
Lymphs Abs: 2.3 10*3/uL (ref 0.7–4.0)
MCHC: 33.7 g/dL (ref 30.0–36.0)
MCV: 96.1 fl (ref 78.0–100.0)
MONOS PCT: 10.2 % (ref 3.0–12.0)
Monocytes Absolute: 0.9 10*3/uL (ref 0.1–1.0)
Neutro Abs: 5.5 10*3/uL (ref 1.4–7.7)
Neutrophils Relative %: 62.5 % (ref 43.0–77.0)
PLATELETS: 227 10*3/uL (ref 150.0–400.0)
RBC: 4.59 Mil/uL (ref 3.87–5.11)
RDW: 13.1 % (ref 11.5–15.5)
WBC: 8.8 10*3/uL (ref 4.0–10.5)

## 2014-07-06 LAB — COMPREHENSIVE METABOLIC PANEL
ALBUMIN: 4.6 g/dL (ref 3.5–5.2)
ALT: 10 U/L (ref 0–35)
AST: 22 U/L (ref 0–37)
Alkaline Phosphatase: 51 U/L (ref 39–117)
BUN: 13 mg/dL (ref 6–23)
CO2: 27 mEq/L (ref 19–32)
Calcium: 9.9 mg/dL (ref 8.4–10.5)
Chloride: 100 mEq/L (ref 96–112)
Creatinine, Ser: 0.9 mg/dL (ref 0.4–1.2)
GFR: 68.35 mL/min (ref >60.00)
GLUCOSE: 87 mg/dL (ref 70–99)
POTASSIUM: 4.5 meq/L (ref 3.5–5.1)
Sodium: 136 mEq/L (ref 135–145)
TOTAL PROTEIN: 8.1 g/dL (ref 6.0–8.3)
Total Bilirubin: 0.4 mg/dL (ref 0.2–1.2)

## 2014-07-06 LAB — T4, FREE: Free T4: 0.77 ng/dL (ref 0.60–1.60)

## 2014-07-07 LAB — CYTOLOGY - PAP

## 2014-07-09 ENCOUNTER — Telehealth: Payer: Self-pay | Admitting: Internal Medicine

## 2014-07-09 MED ORDER — PREDNISONE 20 MG PO TABS: 40.0000 mg | ORAL_TABLET | Freq: Every day | ORAL | Status: DC

## 2014-07-09 NOTE — Telephone Encounter (Signed)
Please let her know that I sent a prescription for oral steroids which is the next step. If she has no improvement by Sunday or Monday-- or she has generalized problems like SOB or mouth swelling, then she will need to be seen again.

## 2014-07-09 NOTE — Telephone Encounter (Signed)
Patient Information:  Caller Name: Massie BougieBelinda  Phone: [phone]  Patient: Rachel Vaughn, Rachel Vaughn  Gender: Female  DOB: [birth year]  Age: 53 Years  PCP: Tillman AbideLetvak , [facility])  Pregnant: No  Office Follow Up:  Does the office need to follow up with this patient?: Yes  Instructions For The Office: Office to f/u with pt after speaking to MD  RN Note:  Pt requesting approval from MD to go to UC since she lives in [place].  Office called and made aware and office staff will send a message to MD and office will call pt back with further instructions  Symptoms  Reason For Call & Symptoms: Pt is calling and states that she was seen in the office on 07/05/14; being treated for poison ivy and sx are worsening; pt wnats to know if MD can change medication over the phone or does she need to go to UC; the rash after applying the cream is extremely blistered and itchy; rash is located to both legs; the rash is not spreading but just becoming blistered and more intense;  one area looks very red; clear fluid from all the blisters; denies purulant drainage;  no fever;  Reviewed Health History In EMR: Yes  Reviewed Medications In EMR: Yes  Reviewed Allergies In EMR: Yes  Reviewed Surgeries / Procedures: Yes  Date of Onset of Symptoms: 07/03/2014  Treatments Tried: Kenalog; Vistaril  Treatments Tried Worked: No OB / GYN:  LMP: 01/06/2014  Guideline(s) Used:  Poison Ivy - [facility]  Disposition Per Guideline:   See Today in Office  Reason For Disposition Reached:   Severe itching interferes with normal activities (e.g., work or school) or prevents sleep  Advice Given:  Apply Cold to the Area:  Soak the involved area in cool water for 20 minutes or massage it with an ice cube as often as necessary to reduce itching and oozing.  Call Back If:  You become worse.  RN Overrode Recommendation:  Go To [facility]  Pt requesting UC visit over OV due to drive time to the office

## 2014-07-09 NOTE — Telephone Encounter (Signed)
Pt notified of Dr. Karle StarchLetvak's comments/recommendations

## 2014-08-02 ENCOUNTER — Ambulatory Visit (INDEPENDENT_AMBULATORY_CARE_PROVIDER_SITE_OTHER): Payer: BC Managed Care – PPO | Admitting: Internal Medicine

## 2014-08-02 ENCOUNTER — Encounter: Payer: Self-pay | Admitting: Internal Medicine

## 2014-08-02 VITALS — BP 118/60 | HR 71 | Temp 98.4°F | Wt 122.0 lb

## 2014-08-02 DIAGNOSIS — F411 Generalized anxiety disorder: Secondary | ICD-10-CM

## 2014-08-02 MED ORDER — ALPRAZOLAM 0.25 MG PO TABS: 0.2500 mg | ORAL_TABLET | Freq: Three times a day (TID) | ORAL | Status: DC | PRN

## 2014-08-02 MED ORDER — HYDROCODONE-ACETAMINOPHEN 7.5-325 MG PO TABS: 1.0000 | ORAL_TABLET | Freq: Two times a day (BID) | ORAL | Status: DC | PRN

## 2014-08-02 NOTE — Progress Notes (Signed)
Subjective:    Patient ID: Rachel Vaughn, female    DOB: March 18, 1961, 53 y.o.   MRN: [id number]  HPI Doing well Still feels she is back to her normal Really relates the psychiatric melt down probably related to alcohol--now avoiding this Still seeing psychologist--trying to get her involved in something (like community center or volunteering at senior center)  Eating better Gained back her weight lately Sleeping fairly well---takes a while to initiate, but then sleeps okay Getting up at 10AM (except for her cats)  Current Outpatient Prescriptions on File Prior to Visit  Medication Sig Dispense Refill  . ALPRAZolam (XANAX) 0.25 MG tablet TAKE TABLET BY MOUTH THREE TIMES DAILY AS NEEDED FOR ANXIETY      . busPIRone (BUSPAR) 10 MG tablet Take 10 mg by mouth 2 (two) times daily.       . cetirizine (ZYRTEC) 10 MG tablet Take 10 mg by mouth daily.      . citalopram (CELEXA) 20 MG tablet Take 1 tablet (20 mg total) by mouth daily.  90 tablet  3  . HYDROcodone-acetaminophen (NORCO) 7.5-325 MG per tablet Take 1-2 tablets by mouth daily as needed.  60 tablet  0  . hydrOXYzine (VISTARIL) 25 MG capsule Take 1-2 capsules (25-50 mg total) by mouth at bedtime as needed for anxiety or itching.  60 capsule  1  . omeprazole (PRILOSEC) 20 MG capsule Take 1 capsule (20 mg total) by mouth daily.  30 capsule  11  . triamcinolone cream (KENALOG) 0.1 % Apply 1 application topically 2 (two) times daily as needed.  30 g  2  . valACYclovir (VALTREX) 1000 MG tablet Take 1,000 mg by mouth 2 (two) times daily as needed.       No current facility-administered medications on file prior to visit.    Allergies  Allergen Reactions  . Extract Of American Electric PowerPoison Ivy   . Fluoxetine Hcl     REACTION: unspecified  . Paroxetine     REACTION: unspecified  . Venlafaxine     REACTION: unspecified    Past Medical History  Diagnosis Date  . Diverticulitis   . Anxiety   . GERD (gastroesophageal reflux disease)   . Asthma    . Social anxiety disorder     Past Surgical History  Procedure Laterality Date  . Breast enhancement surgery  1987  . Cesarean section      Family History  Problem Relation Age of Onset  . Asthma Mother   . COPD Mother   . Diabetes Maternal Grandmother   . Cancer Neg Hx     History   Social History  . Marital Status: Married    Spouse Name: N/A    Number of Children: 2  . Years of Education: N/A   Occupational History  . [employer]-- retail work    Social History Main Topics  . Smoking status: Never Smoker   . Smokeless tobacco: Never Used  . Alcohol Use: Yes  . Drug Use: No  . Sexual Activity: Not on file   Other Topics Concern  . Not on file   Social History Narrative  . No narrative on file   Review of Systems Restarted period again--after 6 months Back still hurts--trying to hold off on pain meds (and ran out)--son still holding this Son now has let her have the alprazolam again    Objective:   Physical Exam  Constitutional: She appears well-developed and well-nourished. No distress.  Psychiatric: She has a normal  mood and affect. Her behavior is normal.          Assessment & Plan:

## 2014-08-02 NOTE — Assessment & Plan Note (Signed)
Has been severe at times Spells of panic that have been disabling Stable again since the recent time in psych hospital

## 2014-08-02 NOTE — Progress Notes (Signed)
Pre visit review using our clinic review tool, if applicable. No additional management support is needed unless otherwise documented below in the visit note. 

## 2014-08-05 ENCOUNTER — Ambulatory Visit: Payer: BC Managed Care – PPO | Admitting: Internal Medicine

## 2014-08-23 ENCOUNTER — Other Ambulatory Visit: Payer: Self-pay | Admitting: Internal Medicine

## 2014-09-13 ENCOUNTER — Other Ambulatory Visit: Payer: Self-pay | Admitting: Internal Medicine

## 2014-09-14 NOTE — Telephone Encounter (Signed)
Approved: #[id number] x 0 

## 2014-09-14 NOTE — Telephone Encounter (Signed)
rx called into pharmacy

## 2014-09-14 NOTE — Telephone Encounter (Signed)
08/02/14 

## 2014-09-28 ENCOUNTER — Telehealth: Payer: Self-pay | Admitting: Internal Medicine

## 2014-09-28 MED ORDER — HYDROCODONE-ACETAMINOPHEN 7.5-325 MG PO TABS
1.0000 | ORAL_TABLET | Freq: Two times a day (BID) | ORAL | Status: DC | PRN
Start: 2014-09-28 — End: 2014-09-28

## 2014-09-28 MED ORDER — HYDROCODONE-ACETAMINOPHEN 7.5-325 MG PO TABS: 1.0000 | ORAL_TABLET | Freq: Two times a day (BID) | ORAL | Status: DC | PRN

## 2014-09-28 NOTE — Telephone Encounter (Signed)
08/02/14 

## 2014-09-28 NOTE — Telephone Encounter (Signed)
Had to reprint--Regina BucklinBaity signed thinking it was for her pt as Rx was on printer

## 2014-11-25 ENCOUNTER — Other Ambulatory Visit: Payer: Self-pay | Admitting: Internal Medicine

## 2014-11-25 MED ORDER — HYDROCODONE-ACETAMINOPHEN 7.5-325 MG PO TABS: 1.0000 | ORAL_TABLET | Freq: Two times a day (BID) | ORAL | Status: DC | PRN

## 2014-11-25 NOTE — Telephone Encounter (Signed)
MyChart Rx RF request.  Last Filled:    60 tablet 0 RF on 09/28/2014  Last OV:  08/02/14.  Upcoming OV scheduled 12/06/14.  Please advise.

## 2014-11-25 NOTE — Telephone Encounter (Signed)
Spoken to patient. Let her know rx ready for pick up. In front office.

## 2014-12-06 ENCOUNTER — Encounter: Payer: Self-pay | Admitting: Internal Medicine

## 2014-12-06 ENCOUNTER — Ambulatory Visit (INDEPENDENT_AMBULATORY_CARE_PROVIDER_SITE_OTHER): Payer: 59 | Admitting: Internal Medicine

## 2014-12-06 VITALS — BP 118/78 | HR 60 | Temp 97.9°F | Wt 123.0 lb

## 2014-12-06 DIAGNOSIS — F411 Generalized anxiety disorder: Secondary | ICD-10-CM

## 2014-12-06 MED ORDER — ALPRAZOLAM 0.25 MG PO TABS: ORAL_TABLET | ORAL | Status: DC

## 2014-12-06 NOTE — Progress Notes (Signed)
Subjective:    Patient ID: Rachel Vaughn, female    DOB: 07-Aug-1961, 54 y.o.   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 ALPRAZolam (XANAX) 0.25 MG tablet take 1 tablet by mouth three times a day AS NEEDED FOR ANXIETY 60 tablet 0  . busPIRone (BUSPAR) 10 MG tablet take 1 tablet by mouth twice a day 180 tablet 2  . cetirizine (ZYRTEC) 10 MG tablet Take 10 mg by mouth daily.    . citalopram (CELEXA) 20 MG tablet Take 1 tablet (20 mg total) by mouth daily. 90 tablet 3  . HYDROcodone-acetaminophen (NORCO) 7.5-325 MG per tablet Take 1-2 tablets by mouth 2 (two) times daily as needed. 60 tablet 0  . hydrOXYzine (VISTARIL) 25 MG capsule Take 1-2 capsules (25-50 mg total) by mouth at bedtime as needed for anxiety or itching. (Patient not taking: Reported on 12/06/2014) 60 capsule 1  . omeprazole (PRILOSEC) 20 MG capsule Take 1 capsule (20 mg total) by mouth daily. (Patient not taking: Reported on 12/06/2014) 30 capsule 11  . triamcinolone cream (KENALOG) 0.1 % Apply 1 application topically 2 (two) times daily as needed. (Patient not taking: Reported on 12/06/2014) 30 g 2  . valACYclovir (VALTREX) 1000 MG tablet Take 1,000 mg by mouth 2 (two) times daily as needed.     No current facility-administered medications on file prior to visit.    Allergies  Allergen Reactions  . Extract Of American Electric PowerPoison Ivy   . Fluoxetine Hcl    REACTION: unspecified  . Paroxetine     REACTION: unspecified  . Venlafaxine     REACTION: unspecified    Past Medical History  Diagnosis Date  . Diverticulitis   . Anxiety   . GERD (gastroesophageal reflux disease)   . Asthma   . Social anxiety disorder     Past Surgical History  Procedure Laterality Date  . Breast enhancement surgery  1987  . Cesarean section      Family History  Problem Relation Age of Onset  . Asthma Mother   . COPD Mother   . Diabetes Maternal Grandmother   . Cancer Neg Hx     History   Social History  . Marital Status: Married    Spouse Name: N/A  . Number of Children: 2  . Years of Education: N/A   Occupational History  . [employer]-- retail work    Social History Main Topics  . Smoking status: Never Smoker   . Smokeless tobacco: Never Used  . Alcohol Use: Yes  . Drug Use: No  . Sexual Activity: Not on file   Other Topics Concern  . Not on file   Social History Narrative   Review of Systems  Not sleeping well since off the temazepam --- awakens every 1-2 hours and "frets" Appetite is okay Weight is stable Will only rarely drink--like 1-2 beers once in  a while    Objective:   Physical Exam  Psychiatric:  Mild anxiety Slightly pressured speech          Assessment & Plan:

## 2014-12-06 NOTE — Assessment & Plan Note (Signed)
Chronic issues Still has trouble leaving house but back into a comfort zone Sees her psychologist Dr Alycia Rossettiyan just about weekly Will continue current meds

## 2014-12-06 NOTE — Patient Instructions (Signed)
Please try melatonin 5mg  at bedtime. If that doesn't work, you can try 10mg  at bedtime.

## 2015-01-17 ENCOUNTER — Other Ambulatory Visit: Payer: Self-pay | Admitting: Internal Medicine

## 2015-01-17 MED ORDER — HYDROCODONE-ACETAMINOPHEN 7.5-325 MG PO TABS: 1.0000 | ORAL_TABLET | Freq: Two times a day (BID) | ORAL | Status: DC | PRN

## 2015-01-17 NOTE — Telephone Encounter (Signed)
Last refill 11/25/14 #60, last office visit 12/06/14. Is it okay to refill medication?

## 2015-01-18 NOTE — Telephone Encounter (Signed)
Left message on voice mail  to call back

## 2015-02-03 ENCOUNTER — Telehealth: Payer: Self-pay | Admitting: Internal Medicine

## 2015-02-03 ENCOUNTER — Encounter: Payer: Self-pay | Admitting: Internal Medicine

## 2015-02-03 ENCOUNTER — Ambulatory Visit (INDEPENDENT_AMBULATORY_CARE_PROVIDER_SITE_OTHER): Payer: 59 | Admitting: Internal Medicine

## 2015-02-03 VITALS — BP 112/68 | HR 53 | Temp 97.8°F | Wt 124.0 lb

## 2015-02-03 DIAGNOSIS — J309 Allergic rhinitis, unspecified: Secondary | ICD-10-CM | POA: Diagnosis not present

## 2015-02-03 NOTE — Patient Instructions (Signed)

## 2015-02-03 NOTE — Telephone Encounter (Signed)
[facility] - Client TELEPHONE ADVICE RECORD [facility] Patient Name: Rachel MullerBELINDA Vaughn DOB: 12-26-1960 Initial Comment caller states she is on day 10 of a cold - the dr also treats her for anxiety. She is having bad congestion - what can she take? Nurse Assessment Nurse: Rachel JunesBrandon, RN, Darl PikesSusan Date/Time Lamount Cohen(Eastern Time): 02/03/2015 8:31:20 AM Confirm and document reason for call. If symptomatic, describe symptoms. ---caller states she is on day 10 of a cold - the dr also treats her for anxiety. She is having bad congestion - what can she take? she has already used the number days of nasal spray she can use and she has been on Zyrtex and Benadryl but not helping - she has head congestion now - she has never bought anything over the counter because she is so sensitive to medication she is afraid to try anything Has the patient traveled out of the country within the last 30 days? ---Yes Where have you traveled? ([place] for [place] and Ebola guideline, [place], [place] MERS) ---[place] Does the patient require triage? ---Yes Related visit to physician within the last 2 weeks? ---No Does the PT have any chronic conditions? (i.e. diabetes, asthma, etc.) ---Yes List chronic conditions. ---anxiety Guidelines Guideline Title Affirmed Question Affirmed Notes Sinus Pain or Congestion [1] Sinus congestion (pressure, fullness) AND [2] present > 10 days Final Disposition User See PCP When Office is Open (within 3 days) Rachel JunesBrandon, RN, Darl PikesSusan Comments appointment scheduled for 10:30 am with Nicki Reaperegina Baity NP

## 2015-02-03 NOTE — Progress Notes (Signed)
Pre visit review using our clinic review tool, if applicable. No additional management support is needed unless otherwise documented below in the visit note. 

## 2015-02-03 NOTE — Progress Notes (Signed)
HPI  Pt presents to the clinic today with c/o facial pain and pressure, nasal congestion and sore throat. This started 10 days ago. She is blowing clear mucous out of her nose but she was concerned because this morning it was light yellow. She denies fever, chills or body aches.  She has tried saline nasal spray, Zyrtec and Benadryl with some relief. She has no history of allergies. She has not had sick contacts. She does not smoke.  Review of Systems    Past Medical History  Diagnosis Date  . Diverticulitis   . Anxiety   . GERD (gastroesophageal reflux disease)   . Asthma   . Social anxiety disorder     Family History  Problem Relation Age of Onset  . Asthma Mother   . COPD Mother   . Diabetes Maternal Grandmother   . Cancer Neg Hx     History   Social History  . Marital Status: Married    Spouse Name: N/A  . Number of Children: 2  . Years of Education: N/A   Occupational History  . [employer]-- retail work    Social History Main Topics  . Smoking status: Never Smoker   . Smokeless tobacco: Never Used  . Alcohol Use: Yes  . Drug Use: No  . Sexual Activity: Not on file   Other Topics Concern  . Not on file   Social History Narrative    Allergies  Allergen Reactions  . Extract Of American Electric PowerPoison Ivy   . Fluoxetine Hcl     REACTION: unspecified  . Paroxetine     REACTION: unspecified  . Venlafaxine     REACTION: unspecified     Constitutional: Denies headache, fatigue, fever or abrupt weight changes.  HEENT:  Positive facial pain, nasal congestion and sore throat. Denies eye redness, ear pain, ringing in the ears, wax buildup, runny nose or bloody nose. Respiratory:  Denies cough, difficulty breathing or shortness of breath.  Cardiovascular: Denies chest pain, chest tightness, palpitations or swelling in the hands or feet.   No other specific complaints in a complete review of systems (except as listed in HPI above).  Objective:  BP 112/68 mmHg  Pulse 53   Temp(Src) 97.8 F (36.6 C) (Oral)  Wt 124 lb (56.246 kg)  SpO2 98%  LMP 09/11/2011   General: Appears her stated age, well developed, well nourished in NAD. HEENT: Head: normal shape and size, mild maxillary sinus tenderness noted; Eyes: sclera white, no icterus, conjunctiva pink; Ears: Tm's gray and intact, normal light reflex; Nose: mucosa boggy and moist, septum midline; Throat/Mouth: + PND. Teeth present, mucosa erythematous and moist, no exudate noted, no lesions or ulcerations noted.  Neck: No adenopathy noted. Cardiovascular: Normal rate and rhythm. S1,S2 noted.  No murmur, rubs or gallops noted.  Pulmonary/Chest: Normal effort and positive vesicular breath sounds. No respiratory distress. No wheezes, rales or ronchi noted.      Assessment & Plan:   Allergic Rhintis  Can use a Neti Pot which can be purchased from your local drug store. Nasocort 2 sprays each nostril for 3 days and then as needed. Switch Zyrtec to Allegra OK to try some Sudafed OTC but no more than 3 days  RTC as needed or if symptoms persist.

## 2015-02-18 ENCOUNTER — Other Ambulatory Visit: Payer: Self-pay | Admitting: Internal Medicine

## 2015-02-21 NOTE — Telephone Encounter (Signed)
12/06/2014 

## 2015-02-21 NOTE — Telephone Encounter (Signed)
rx called into pharmacy

## 2015-02-21 NOTE — Telephone Encounter (Signed)
Approved: #[id number] x 0 

## 2015-02-28 ENCOUNTER — Other Ambulatory Visit: Payer: Self-pay | Admitting: Internal Medicine

## 2015-03-01 MED ORDER — HYDROCODONE-ACETAMINOPHEN 7.5-325 MG PO TABS: 1.0000 | ORAL_TABLET | Freq: Two times a day (BID) | ORAL | Status: DC | PRN

## 2015-03-01 NOTE — Telephone Encounter (Signed)
01/17/2015

## 2015-03-17 ENCOUNTER — Telehealth: Payer: Self-pay | Admitting: *Deleted

## 2015-03-17 NOTE — Telephone Encounter (Signed)
Patient states it has been about 4 years since she had a mammogram and it was done in [place].  Patient states she lives in [place] and will schedule it at a facility closer to home and have them send the report to Dr. Alphonsus Sias.

## 2015-04-06 ENCOUNTER — Other Ambulatory Visit: Payer: Self-pay | Admitting: Internal Medicine

## 2015-04-06 NOTE — Telephone Encounter (Signed)
03/01/15

## 2015-04-07 MED ORDER — HYDROCODONE-ACETAMINOPHEN 7.5-325 MG PO TABS: 1.0000 | ORAL_TABLET | Freq: Two times a day (BID) | ORAL | Status: DC | PRN

## 2015-04-07 NOTE — Telephone Encounter (Signed)
Left message on machine that rx is ready for pick-up, and it will be at our front desk.  

## 2015-04-19 ENCOUNTER — Other Ambulatory Visit: Payer: Self-pay | Admitting: Internal Medicine

## 2015-04-19 NOTE — Telephone Encounter (Signed)
02/21/15 

## 2015-04-20 NOTE — Telephone Encounter (Signed)
rx called into pharmacy

## 2015-04-20 NOTE — Telephone Encounter (Signed)
Approved:okay #60 x 0 

## 2015-05-11 ENCOUNTER — Ambulatory Visit (INDEPENDENT_AMBULATORY_CARE_PROVIDER_SITE_OTHER): Payer: 59 | Admitting: Internal Medicine

## 2015-05-11 ENCOUNTER — Encounter: Payer: Self-pay | Admitting: Internal Medicine

## 2015-05-11 VITALS — BP 100/60 | HR 67 | Temp 98.2°F | Wt 126.0 lb

## 2015-05-11 DIAGNOSIS — F411 Generalized anxiety disorder: Secondary | ICD-10-CM | POA: Diagnosis not present

## 2015-05-11 MED ORDER — HYDROCODONE-ACETAMINOPHEN 7.5-325 MG PO TABS: 1.0000 | ORAL_TABLET | Freq: Two times a day (BID) | ORAL | Status: DC | PRN

## 2015-05-11 NOTE — Progress Notes (Signed)
Pre visit review using our clinic review tool, if applicable. No additional management support is needed unless otherwise documented below in the visit note. 
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
## 2015-05-11 NOTE — Assessment & Plan Note (Signed)
Severe She feels a little worse--but things still sound under control If worsens, could consider increasing the citalopram. Not sure that is needed now Has done well having her regular psychology appointments

## 2015-06-20 ENCOUNTER — Other Ambulatory Visit: Payer: Self-pay | Admitting: Internal Medicine

## 2015-06-20 MED ORDER — HYDROCODONE-ACETAMINOPHEN 7.5-325 MG PO TABS
1.0000 | ORAL_TABLET | Freq: Two times a day (BID) | ORAL | Status: DC | PRN
Start: 2015-06-20 — End: 2015-07-21

## 2015-06-20 NOTE — Telephone Encounter (Signed)
05/11/2015

## 2015-06-21 NOTE — Telephone Encounter (Signed)
Sent patient message back thru my-chart, that prescription is ready for pick-up and will be at the front desk.  

## 2015-07-21 ENCOUNTER — Encounter: Payer: Self-pay | Admitting: Internal Medicine

## 2015-07-21 ENCOUNTER — Ambulatory Visit (INDEPENDENT_AMBULATORY_CARE_PROVIDER_SITE_OTHER): Payer: 59 | Admitting: Internal Medicine

## 2015-07-21 VITALS — BP 120/60 | HR 117 | Temp 97.5°F | Ht 66.0 in | Wt 125.0 lb

## 2015-07-21 DIAGNOSIS — M545 Low back pain: Secondary | ICD-10-CM

## 2015-07-21 DIAGNOSIS — F411 Generalized anxiety disorder: Secondary | ICD-10-CM | POA: Diagnosis not present

## 2015-07-21 DIAGNOSIS — G8929 Other chronic pain: Secondary | ICD-10-CM

## 2015-07-21 DIAGNOSIS — J45991 Cough variant asthma: Secondary | ICD-10-CM | POA: Diagnosis not present

## 2015-07-21 DIAGNOSIS — Z23 Encounter for immunization: Secondary | ICD-10-CM | POA: Diagnosis not present

## 2015-07-21 DIAGNOSIS — Z0000 Encounter for general adult medical examination without abnormal findings: Secondary | ICD-10-CM

## 2015-07-21 MED ORDER — MONTELUKAST SODIUM 10 MG PO TABS: 10.0000 mg | ORAL_TABLET | Freq: Every day | ORAL | Status: DC

## 2015-07-21 MED ORDER — ALBUTEROL SULFATE HFA 108 (90 BASE) MCG/ACT IN AERS: 2.0000 | INHALATION_SPRAY | Freq: Three times a day (TID) | RESPIRATORY_TRACT | Status: DC | PRN

## 2015-07-21 MED ORDER — HYDROCODONE-ACETAMINOPHEN 7.5-325 MG PO TABS: 1.0000 | ORAL_TABLET | Freq: Two times a day (BID) | ORAL | Status: DC | PRN

## 2015-07-21 NOTE — Progress Notes (Signed)
Pre visit review using our clinic review tool, if applicable. No additional management support is needed unless otherwise documented below in the visit note. 

## 2015-07-21 NOTE — Assessment & Plan Note (Signed)
Allergic component? Will try montelukast Albuterol for prn--but usually makes her shaky

## 2015-07-21 NOTE — Assessment & Plan Note (Signed)
Chronic and ongoing Will continue current Rx

## 2015-07-21 NOTE — Assessment & Plan Note (Signed)
Due for mammogram Pap in 2018 Colon in 2018 Flu vaccine today

## 2015-07-21 NOTE — Progress Notes (Signed)
Subjective:    Patient ID: Rachel Vaughn, female    DOB: [birth year], 54 y.o.   MRN: [id number]  HPI Here for physical  Still gets panic attacks --got one when came in the exam room Settled down okay  These occur infrequently--since she mostly stays in Nighttime is the hard time for this Satisfied with current medications  Back pain has been progressively worsening Considering chiropractic-- I recommended that she consider this Still uses hydrocodone twice a day often--especially if does yard work Still does a few hours of babysitting--this can be tough also  Current Outpatient Prescriptions on File Prior to Visit  Medication Sig Dispense Refill  . ALPRAZolam (XANAX) 0.25 MG tablet Take 1 tablet (0.25 mg total) by mouth 3 (three) times daily as needed for anxiety. 60 tablet 0  . busPIRone (BUSPAR) 10 MG tablet take 1 tablet by mouth twice a day 180 tablet 2  . cetirizine (ZYRTEC) 10 MG tablet Take 10 mg by mouth daily.    . citalopram (CELEXA) 20 MG tablet take 1 tablet by mouth once daily 90 tablet 3  . HYDROcodone-acetaminophen (NORCO) 7.5-325 MG per tablet Take 1-2 tablets by mouth 2 (two) times daily as needed. 60 tablet 0  . valACYclovir (VALTREX) 1000 MG tablet Take 1,000 mg by mouth 2 (two) times daily as needed.     No current facility-administered medications on file prior to visit.    Allergies  Allergen Reactions  . Extract Of [employer]   . Fluoxetine Hcl     REACTION: unspecified  . Paroxetine     REACTION: unspecified  . Venlafaxine     REACTION: unspecified    Past Medical History  Diagnosis Date  . Diverticulitis   . Anxiety   . GERD (gastroesophageal reflux disease)   . Asthma   . Social anxiety disorder     Past Surgical History  Procedure Laterality Date  . Breast enhancement surgery  1987  . Cesarean section      Family History  Problem Relation Age of Onset  . Asthma Mother   . COPD Mother   . Diabetes Maternal Grandmother   .  Cancer Neg Hx     Social History   Social History  . Marital Status: Married    Spouse Name: N/A  . Number of Children: 2  . Years of Education: N/A   Occupational History  . Disabled ---anxiety    Social History Main Topics  . Smoking status: Never Smoker   . Smokeless tobacco: Never Used  . Alcohol Use: Yes  . Drug Use: No  . Sexual Activity: Not on file   Other Topics Concern  . Not on file   Social History Narrative   Review of Systems  Constitutional: Positive for fatigue. Negative for unexpected weight change.       Wears seat belt  HENT: Negative for dental problem, hearing loss and tinnitus.        Keeps up with dentist  Eyes: Negative for visual disturbance.       No diplopia or unilateral vision loss  Respiratory: Positive for cough and shortness of breath. Negative for wheezing.   Cardiovascular: Positive for palpitations. Negative for chest pain and leg swelling.  Gastrointestinal: Negative for nausea, vomiting, constipation and blood in stool.       No heartburn  Endocrine: Negative for polydipsia and polyuria.  Genitourinary: Negative for dysuria, hematuria and difficulty urinating.  Musculoskeletal: Positive for back pain. Negative for joint swelling  and arthralgias.  Allergic/Immunologic: Positive for environmental allergies. Negative for immunocompromised state.       Uses the cetirizine daily  Neurological: Positive for dizziness, numbness and headaches. Negative for syncope, weakness and light-headedness.       Left posterior thigh burning down to foot--at times. Discussed that this is probably sciatica  Hematological: Negative for adenopathy. Does not bruise/bleed easily.  Psychiatric/Behavioral: Positive for sleep disturbance and dysphoric mood. The patient is nervous/anxious.        Objective:   Physical Exam  Constitutional: She is oriented to person, place, and time. She appears well-developed and well-nourished. No distress.  HENT:  Head:  Normocephalic and atraumatic.  Right Ear: External ear normal.  Left Ear: External ear normal.  Mouth/Throat: Oropharynx is clear and moist. No oropharyngeal exudate.  Eyes: Conjunctivae and EOM are normal. Pupils are equal, round, and reactive to light.  Neck: Normal range of motion. Neck supple. No thyromegaly present.  Cardiovascular: Normal rate, regular rhythm, normal heart sounds and intact distal pulses.  Exam reveals no gallop.   No murmur heard. Pulmonary/Chest: Effort normal and breath sounds normal. No respiratory distress. She has no wheezes. She has no rales.  Abdominal: Soft. There is no tenderness.  Musculoskeletal: She exhibits no edema or tenderness.  Lymphadenopathy:    She has no cervical adenopathy.  Neurological: She is alert and oriented to person, place, and time.  Skin: No rash noted. No erythema.  Psychiatric:  Usual anxious self          Assessment & Plan:

## 2015-07-21 NOTE — Assessment & Plan Note (Signed)
Hydrocodone prn Will proceed with chiropractor

## 2015-07-21 NOTE — Addendum Note (Signed)
Addended by: Sueanne MargaritaSMITH, Normal Recinos L on: 07/21/2015 05:05 PM   Modules accepted: Orders

## 2015-07-22 ENCOUNTER — Other Ambulatory Visit: Payer: Self-pay | Admitting: Internal Medicine

## 2015-07-22 LAB — COMPREHENSIVE METABOLIC PANEL
ALBUMIN: 4.1 g/dL (ref 3.5–5.2)
ALT: 13 U/L (ref 0–35)
AST: 19 U/L (ref 0–37)
Alkaline Phosphatase: 53 U/L (ref 39–117)
BUN: 19 mg/dL (ref 6–23)
CALCIUM: 9.6 mg/dL (ref 8.4–10.5)
CO2: 29 mEq/L (ref 19–32)
Chloride: 101 mEq/L (ref 96–112)
Creatinine, Ser: 0.92 mg/dL (ref 0.40–1.20)
GFR: 67.24 mL/min (ref >60.00)
Glucose, Bld: 80 mg/dL (ref 70–99)
POTASSIUM: 4.3 meq/L (ref 3.5–5.1)
SODIUM: 137 meq/L (ref 135–145)
Total Bilirubin: 0.3 mg/dL (ref 0.2–1.2)
Total Protein: 7.2 g/dL (ref 6.0–8.3)

## 2015-07-22 LAB — CBC WITH DIFFERENTIAL/PLATELET
Basophils Absolute: 0.1 10*3/uL (ref 0.0–0.1)
Basophils Relative: 0.7 % (ref 0.0–3.0)
EOS PCT: 1.1 % (ref 0.0–5.0)
Eosinophils Absolute: 0.1 10*3/uL (ref 0.0–0.7)
HEMATOCRIT: 40.7 % (ref 36.0–46.0)
HEMOGLOBIN: 13.7 g/dL (ref 12.0–15.0)
LYMPHS PCT: 29.6 % (ref 12.0–46.0)
Lymphs Abs: 2.4 10*3/uL (ref 0.7–4.0)
MCHC: 33.6 g/dL (ref 30.0–36.0)
MCV: 94.2 fl (ref 78.0–100.0)
Monocytes Absolute: 0.8 10*3/uL (ref 0.1–1.0)
Monocytes Relative: 9.8 % (ref 3.0–12.0)
Neutro Abs: 4.7 10*3/uL (ref 1.4–7.7)
Neutrophils Relative %: 58.8 % (ref 43.0–77.0)
Platelets: 220 10*3/uL (ref 150.0–400.0)
RBC: 4.32 Mil/uL (ref 3.87–5.11)
RDW: 13.1 % (ref 11.5–15.5)
WBC: 8 10*3/uL (ref 4.0–10.5)

## 2015-07-22 LAB — T4, FREE: Free T4: 0.71 ng/dL (ref 0.60–1.60)

## 2015-07-25 NOTE — Telephone Encounter (Signed)
Ok to refill #60, 0 ref 

## 2015-07-25 NOTE — Telephone Encounter (Signed)
rx called into pharmacy

## 2015-07-25 NOTE — Telephone Encounter (Signed)
Last filled 04/20/15 [name], Please send back to me for call in

## 2015-07-28 LAB — HM MAMMOGRAPHY: HM MAMMO: NORMAL

## 2015-08-04 ENCOUNTER — Encounter: Payer: Self-pay | Admitting: *Deleted

## 2015-08-04 ENCOUNTER — Encounter: Payer: Self-pay | Admitting: Internal Medicine

## 2015-08-05 ENCOUNTER — Ambulatory Visit: Payer: 59 | Admitting: Internal Medicine

## 2015-08-22 ENCOUNTER — Other Ambulatory Visit: Payer: Self-pay | Admitting: Internal Medicine

## 2015-08-22 MED ORDER — HYDROCODONE-ACETAMINOPHEN 7.5-325 MG PO TABS: 1.0000 | ORAL_TABLET | Freq: Two times a day (BID) | ORAL | Status: DC | PRN

## 2015-08-22 MED ORDER — ALPRAZOLAM 0.25 MG PO TABS: 0.2500 mg | ORAL_TABLET | Freq: Three times a day (TID) | ORAL | Status: DC | PRN

## 2015-08-22 NOTE — Telephone Encounter (Signed)
07/21/15 hydrocodone 07/25/15 xanax

## 2015-08-23 NOTE — Telephone Encounter (Signed)
Sent patient message back thru my-chart, that prescription is ready for pick-up and will be at the front desk.  

## 2015-09-19 ENCOUNTER — Other Ambulatory Visit: Payer: Self-pay | Admitting: Internal Medicine

## 2015-09-20 ENCOUNTER — Other Ambulatory Visit: Payer: Self-pay | Admitting: Internal Medicine

## 2015-09-21 MED ORDER — ALPRAZOLAM 0.25 MG PO TABS: 0.2500 mg | ORAL_TABLET | Freq: Three times a day (TID) | ORAL | Status: DC | PRN

## 2015-09-21 MED ORDER — HYDROCODONE-ACETAMINOPHEN 7.5-325 MG PO TABS: 1.0000 | ORAL_TABLET | Freq: Two times a day (BID) | ORAL | Status: DC | PRN

## 2015-09-21 NOTE — Telephone Encounter (Signed)
Sent patient message back thru my-chart, that prescription is ready for pick-up and will be at the front desk.  

## 2015-09-21 NOTE — Telephone Encounter (Signed)
08/22/2015 

## 2015-10-06 ENCOUNTER — Encounter: Payer: Self-pay | Admitting: Internal Medicine

## 2015-10-06 ENCOUNTER — Ambulatory Visit (INDEPENDENT_AMBULATORY_CARE_PROVIDER_SITE_OTHER): Payer: 59 | Admitting: Internal Medicine

## 2015-10-06 VITALS — BP 110/70 | HR 64 | Temp 98.3°F | Wt 119.0 lb

## 2015-10-06 DIAGNOSIS — K625 Hemorrhage of anus and rectum: Secondary | ICD-10-CM

## 2015-10-06 DIAGNOSIS — J0110 Acute frontal sinusitis, unspecified: Secondary | ICD-10-CM | POA: Insufficient documentation

## 2015-10-06 MED ORDER — AMOXICILLIN 500 MG PO TABS: 1000.0000 mg | ORAL_TABLET | Freq: Two times a day (BID) | ORAL | Status: DC

## 2015-10-06 MED ORDER — HYDROCORTISONE 2.5 % EX CREA: TOPICAL_CREAM | Freq: Three times a day (TID) | CUTANEOUS | Status: AC | PRN

## 2015-10-06 NOTE — Assessment & Plan Note (Addendum)
Sick for a month--clearly seems to have secondary bacterial infection Eye drainage secondary to this Tylenol Amoxil for infection Would phone in bleph 10 next week if eye are not better

## 2015-10-06 NOTE — Progress Notes (Signed)
Subjective:    Patient ID: Rachel Vaughn, female    DOB: [birth year], 54 y.o.   MRN: [id number]  HPI Here for follow up from urgent care Sick since December 1st Sore throat and nasal/chest congestion Lots of green mucus-- with cough Went to urgent care after 1 week--told to take mucinex and sudafed for 1 week Felt better and more clear for 3 days Then throat worsened again and had fever/chills Stayed up at 101 for 3 days so went back to urgent care Then given 2 days of steroids for her throat---no help  Has had 3 days of eye drainage--needs warm compresses just to get them open Ears are congested Fever is better over the past 5-6 days Severe heart palpitations and some SOB  Current Outpatient Prescriptions on File Prior to Visit  Medication Sig Dispense Refill  . albuterol (PROVENTIL HFA;VENTOLIN HFA) 108 (90 BASE) MCG/ACT inhaler Inhale 2 puffs into the lungs 3 (three) times daily as needed for wheezing or shortness of breath. 1 Inhaler 1  . ALPRAZolam (XANAX) 0.25 MG tablet Take 1 tablet (0.25 mg total) by mouth 3 (three) times daily as needed for anxiety. 60 tablet 0  . busPIRone (BUSPAR) 10 MG tablet take 1 tablet by mouth twice a day 180 tablet 2  . cetirizine (ZYRTEC) 10 MG tablet Take 10 mg by mouth daily.    . citalopram (CELEXA) 20 MG tablet take 1 tablet by mouth once daily 90 tablet 3  . HYDROcodone-acetaminophen (NORCO) 7.5-325 MG tablet Take 1-2 tablets by mouth 2 (two) times daily as needed. 60 tablet 0  . montelukast (SINGULAIR) 10 MG tablet Take 1 tablet (10 mg total) by mouth at bedtime. 30 tablet 11  . valACYclovir (VALTREX) 1000 MG tablet Take 1,000 mg by mouth 2 (two) times daily as needed.     No current facility-administered medications on file prior to visit.    Allergies  Allergen Reactions  . Extract Of [employer]   . Fluoxetine Hcl     REACTION: unspecified  . Paroxetine     REACTION: unspecified  . Venlafaxine     REACTION: unspecified     Past Medical History  Diagnosis Date  . Diverticulitis   . Anxiety   . GERD (gastroesophageal reflux disease)   . Asthma   . Social anxiety disorder     Past Surgical History  Procedure Laterality Date  . Breast enhancement surgery  1987  . Cesarean section      Family History  Problem Relation Age of Onset  . Asthma Mother   . COPD Mother   . Diabetes Maternal Grandmother   . Cancer Neg Hx     Social History   Social History  . Marital Status: Married    Spouse Name: N/A  . Number of Children: 2  . Years of Education: N/A   Occupational History  . Disabled ---anxiety    Social History Main Topics  . Smoking status: Never Smoker   . Smokeless tobacco: Never Used  . Alcohol Use: Yes  . Drug Use: No  . Sexual Activity: Not on file   Other Topics Concern  . Not on file   Social History Narrative   Review of Systems Son just got married in [place]--just flew back last night Having problems with bowels-- constipated and almost blocked up Some bad rectal bleeding she thinks are hemorrhoids Not really painful so not using creams. Uses witch hazel    Objective:   Physical Exam  HENT:  Slight conjunctival injection. Sclera clear TMs normal Moderate nasal inflammation Pharynx with just mild injection Moderate bilateral frontal sinus tenderness  Neck: Normal range of motion. Neck supple.  Pulmonary/Chest: Effort normal and breath sounds normal. No respiratory distress. She has no wheezes. She has no rales.  Genitourinary:  Circumferential external hemorrhoids--not inflamed No internal lesions Small amount brown stool--heme negative  Lymphadenopathy:    She has no cervical adenopathy.          Assessment & Plan:

## 2015-10-06 NOTE — Progress Notes (Signed)
Pre visit review using our clinic review tool, if applicable. No additional management support is needed unless otherwise documented below in the visit note. 

## 2015-10-06 NOTE — Assessment & Plan Note (Signed)
Probably from her hemorrhoids but I don't see the source of clots she saw last night Last colon >8 years ago [facility]

## 2015-10-07 ENCOUNTER — Ambulatory Visit: Payer: 59 | Admitting: Internal Medicine

## 2015-10-20 ENCOUNTER — Other Ambulatory Visit: Payer: Self-pay | Admitting: Internal Medicine

## 2015-10-20 MED ORDER — HYDROCODONE-ACETAMINOPHEN 7.5-325 MG PO TABS: 1.0000 | ORAL_TABLET | Freq: Two times a day (BID) | ORAL | Status: DC | PRN

## 2015-10-20 NOTE — Telephone Encounter (Signed)
09/21/2015 

## 2015-10-20 NOTE — Telephone Encounter (Signed)
Sent patient message back thru my-chart, that prescription is ready for pick-up and will be at the front desk.  

## 2015-10-27 ENCOUNTER — Telehealth: Payer: Self-pay | Admitting: Internal Medicine

## 2015-10-27 NOTE — Telephone Encounter (Signed)
Okay As long as she doesn't have more bleeding, this is probably okay

## 2015-10-27 NOTE — Telephone Encounter (Signed)
Called patient to check on [facility] GI appt to see if they made one for her. She said they did call her to schedule but she decided not to go at this time because her problem of rectal bleeding cleared up and she is not due until 8/18 for her next Colonoscopy. Cancelling the GI Referral.

## 2015-11-11 ENCOUNTER — Encounter: Payer: Self-pay | Admitting: Internal Medicine

## 2015-11-11 ENCOUNTER — Ambulatory Visit (INDEPENDENT_AMBULATORY_CARE_PROVIDER_SITE_OTHER): Payer: BLUE CROSS/BLUE SHIELD | Admitting: Internal Medicine

## 2015-11-11 VITALS — BP 110/70 | HR 70 | Temp 98.6°F | Wt 122.0 lb

## 2015-11-11 DIAGNOSIS — J0111 Acute recurrent frontal sinusitis: Secondary | ICD-10-CM | POA: Diagnosis not present

## 2015-11-11 MED ORDER — AMOXICILLIN-POT CLAVULANATE 875-125 MG PO TABS: 1.0000 | ORAL_TABLET | Freq: Two times a day (BID) | ORAL | Status: DC

## 2015-11-11 NOTE — Progress Notes (Signed)
Subjective:    Patient ID: Rachel Vaughn, female    DOB: 10/19/1960, 55 y.o.   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 albuterol (PROVENTIL HFA;VENTOLIN HFA) 108 (90 BASE) MCG/ACT inhaler Inhale 2 puffs into the lungs 3 (three) times daily as needed for wheezing or shortness of breath. 1 Inhaler 1  . ALPRAZolam (XANAX) 0.25 MG tablet Take 1 tablet (0.25 mg total) by mouth 3 (three) times daily as needed for anxiety. 60 tablet 0  . busPIRone (BUSPAR) 10 MG tablet take 1 tablet by mouth twice a day 180 tablet 2  . cetirizine (ZYRTEC) 10 MG tablet Take 10 mg by mouth daily.    . citalopram (CELEXA) 20 MG tablet take 1 tablet by mouth once daily 90 tablet 3  . HYDROcodone-acetaminophen (NORCO) 7.5-325 MG tablet Take 1-2 tablets by mouth 2 (two) times daily as needed. 60 tablet 0  . hydrocortisone 2.5 % cream Apply topically 3 (three) times daily as needed. 28 g 3  . montelukast (SINGULAIR) 10 MG tablet Take 1 tablet (10 mg total) by mouth at bedtime. 30 tablet 11  . valACYclovir (VALTREX) 1000 MG tablet Take 1,000 mg by mouth 2 (two) times daily as needed.     No current facility-administered medications on file prior to visit.    Allergies  Allergen Reactions  . Extract Of [employer]   . Fluoxetine Hcl     REACTION: unspecified    . Paroxetine     REACTION: unspecified  . Venlafaxine     REACTION: unspecified    Past Medical History  Diagnosis Date  . Diverticulitis   . Anxiety   . GERD (gastroesophageal reflux disease)   . Asthma   . Social anxiety disorder     Past Surgical History  Procedure Laterality Date  . Breast enhancement surgery  1987  . Cesarean section      Family History  Problem Relation Age of Onset  . Asthma Mother   . COPD Mother   . Diabetes Maternal Grandmother   . Cancer Neg Hx     Social History   Social History  . Marital Status: Married    Spouse Name: N/A  . Number of Children: 2  . Years of Education: N/A   Occupational History  . Disabled ---anxiety    Social History Main Topics  . Smoking status: Never Smoker   . Smokeless tobacco: Never Used  . Alcohol Use: Yes  . Drug Use: No  . Sexual Activity: Not on file   Other Topics Concern  . Not on file   Social History Narrative   Review of Systems  No rash No vomiting or diarrhea Appetite is very good now     Objective:   Physical Exam  Constitutional: She appears well-developed and well-nourished. No distress.  HENT:  No sinus tenderness Slight tonsillar tissue but not really inflamed or enlarged TMs normal Mild nasal inflammation  Neck: Normal range of motion. Neck supple. No thyromegaly present.  Pulmonary/Chest: Effort normal and breath sounds normal. No respiratory distress. She has no wheezes. She has no rales.  Lymphadenopathy:    She has no cervical adenopathy.          Assessment & Plan:

## 2015-11-11 NOTE — Assessment & Plan Note (Signed)
Did improve some but symptoms returned Much of her symptoms may be related to allergies Asked her to restart the montelukast (which she had stopped) Will give antibiotic again--augmentin

## 2015-11-11 NOTE — Progress Notes (Signed)
Pre visit review using our clinic review tool, if applicable. No additional management support is needed unless otherwise documented below in the visit note. 

## 2015-11-11 NOTE — Patient Instructions (Signed)
Please restart the montelukast (singulair) daily as well as zyrtec and flonase spray (yes--take them all daily)

## 2015-11-21 ENCOUNTER — Other Ambulatory Visit: Payer: Self-pay | Admitting: Internal Medicine

## 2015-11-21 MED ORDER — HYDROCODONE-ACETAMINOPHEN 7.5-325 MG PO TABS: 1.0000 | ORAL_TABLET | Freq: Two times a day (BID) | ORAL | Status: DC | PRN

## 2015-11-21 MED ORDER — ALPRAZOLAM 0.25 MG PO TABS: 0.2500 mg | ORAL_TABLET | Freq: Three times a day (TID) | ORAL | Status: DC | PRN

## 2015-11-21 NOTE — Telephone Encounter (Signed)
Xanax 09/21/15 norco 10/20/15

## 2015-11-21 NOTE — Telephone Encounter (Signed)
Sent patient message back thru my-chart, that prescription is ready for pick-up and will be at the front desk.  

## 2015-12-17 ENCOUNTER — Other Ambulatory Visit: Payer: Self-pay | Admitting: Internal Medicine

## 2015-12-18 NOTE — Telephone Encounter (Addendum)
Last office visit 11/11/2015.  Last refilled 11/21/2015 for #60 with no refills.   Ok to refill?  Dr. Alphonsus SiasLetvak out of the office on Vacation.

## 2015-12-19 MED ORDER — HYDROCODONE-ACETAMINOPHEN 7.5-325 MG PO TABS: 1.0000 | ORAL_TABLET | Freq: Two times a day (BID) | ORAL | Status: DC | PRN

## 2015-12-19 NOTE — Telephone Encounter (Signed)
Spoke to pt. Rx is ready to pick up. Dr Patsy Lageropland signed.

## 2016-01-18 ENCOUNTER — Ambulatory Visit (INDEPENDENT_AMBULATORY_CARE_PROVIDER_SITE_OTHER): Payer: BLUE CROSS/BLUE SHIELD | Admitting: Internal Medicine

## 2016-01-18 ENCOUNTER — Encounter: Payer: Self-pay | Admitting: Internal Medicine

## 2016-01-18 VITALS — BP 102/62 | HR 93 | Temp 97.9°F | Wt 124.5 lb

## 2016-01-18 DIAGNOSIS — G8929 Other chronic pain: Secondary | ICD-10-CM | POA: Diagnosis not present

## 2016-01-18 DIAGNOSIS — M545 Low back pain, unspecified: Secondary | ICD-10-CM

## 2016-01-18 DIAGNOSIS — F411 Generalized anxiety disorder: Secondary | ICD-10-CM

## 2016-01-18 MED ORDER — HYDROCODONE-ACETAMINOPHEN 7.5-325 MG PO TABS: 1.0000 | ORAL_TABLET | Freq: Two times a day (BID) | ORAL | Status: DC | PRN

## 2016-01-18 MED ORDER — ALPRAZOLAM 0.25 MG PO TABS: 0.2500 mg | ORAL_TABLET | Freq: Three times a day (TID) | ORAL | Status: DC | PRN

## 2016-01-18 NOTE — Assessment & Plan Note (Signed)
Discussed more core strengthening, TENS, ?inversion table

## 2016-01-18 NOTE — Progress Notes (Signed)
Subjective:    Patient ID: Rachel Vaughn, female    DOB: August 17, 1961, 55 y.o.   MRN: [id number]  HPI Here for follow up of anxiety Things are stable Still sees counselor Dr Alycia Rossetti weekly Now has hearing for SSI disability  Still babysits girls twice a week Able to get out to store and do her errands Has panic spells at times---uses the alprazolam if she notices early symptoms Uses this for sleep as well  Back pain has gotten worse Using the hydrocodone twice a day mostly Does try to do some back strengthening Uses heat Discussed inversion table again Also may need to try TENS unit again  Current Outpatient Prescriptions on File Prior to Visit  Medication Sig Dispense Refill  . albuterol (PROVENTIL HFA;VENTOLIN HFA) 108 (90 BASE) MCG/ACT inhaler Inhale 2 puffs into the lungs 3 (three) times daily as needed for wheezing or shortness of breath. 1 Inhaler 1  . ALPRAZolam (XANAX) 0.25 MG tablet Take 1 tablet (0.25 mg total) by mouth 3 (three) times daily as needed for anxiety. 60 tablet 0  . busPIRone (BUSPAR) 10 MG tablet take 1 tablet by mouth twice a day 180 tablet 2  . cetirizine (ZYRTEC) 10 MG tablet Take 10 mg by mouth daily.    . citalopram (CELEXA) 20 MG tablet take 1 tablet by mouth once daily 90 tablet 3  . fluticasone (FLONASE) 50 MCG/ACT nasal spray Place 2 sprays into both nostrils daily.    Marland Kitchen HYDROcodone-acetaminophen (NORCO) 7.5-325 MG tablet Take 1-2 tablets by mouth 2 (two) times daily as needed. 60 tablet 0  . hydrocortisone 2.5 % cream Apply topically 3 (three) times daily as needed. 28 g 3  . montelukast (SINGULAIR) 10 MG tablet Take 1 tablet (10 mg total) by mouth at bedtime. 30 tablet 11  . valACYclovir (VALTREX) 1000 MG tablet Take 1,000 mg by mouth 2 (two) times daily as needed.     No current facility-administered medications on file prior to visit.    Allergies  Allergen Reactions  . Extract Of [employer]   . Fluoxetine Hcl     REACTION: unspecified    . Paroxetine     REACTION: unspecified  . Venlafaxine     REACTION: unspecified    Past Medical History  Diagnosis Date  . Diverticulitis   . Anxiety   . GERD (gastroesophageal reflux disease)   . Asthma   . Social anxiety disorder     Past Surgical History  Procedure Laterality Date  . Breast enhancement surgery  1987  . Cesarean section      Family History  Problem Relation Age of Onset  . Asthma Mother   . COPD Mother   . Diabetes Maternal Grandmother   . Cancer Neg Hx     Social History   Social History  . Marital Status: Married    Spouse Name: N/A  . Number of Children: 2  . Years of Education: N/A   Occupational History  . Disabled ---anxiety    Social History Main Topics  . Smoking status: Never Smoker   . Smokeless tobacco: Never Used  . Alcohol Use: Yes  . Drug Use: No  . Sexual Activity: Not on file   Other Topics Concern  . Not on file   Social History Narrative   Review of Systems Appetite is fine Weight is stable Craving sweets since menopause    Objective:   Physical Exam  Constitutional: She appears well-developed and well-nourished. No distress.  Psychiatric:  Normal appearance and speech No evident depression          Assessment & Plan:

## 2016-01-18 NOTE — Progress Notes (Signed)
Pre visit review using our clinic review tool, if applicable. No additional management support is needed unless otherwise documented below in the visit note. 

## 2016-01-18 NOTE — Assessment & Plan Note (Signed)
Disabling Working on SSI disability Continues on the meds

## 2016-02-14 ENCOUNTER — Other Ambulatory Visit: Payer: Self-pay | Admitting: Internal Medicine

## 2016-02-15 ENCOUNTER — Other Ambulatory Visit: Payer: Self-pay

## 2016-02-15 MED ORDER — HYDROCODONE-ACETAMINOPHEN 7.5-325 MG PO TABS: 1.0000 | ORAL_TABLET | Freq: Two times a day (BID) | ORAL | Status: DC | PRN

## 2016-02-15 MED ORDER — CITALOPRAM HYDROBROMIDE 20 MG PO TABS: 20.0000 mg | ORAL_TABLET | Freq: Every day | ORAL | Status: DC

## 2016-02-15 NOTE — Telephone Encounter (Signed)
Rx sent electronically.  

## 2016-02-15 NOTE — Telephone Encounter (Signed)
MyChart message sent to pt advising rx up front for pickup

## 2016-02-15 NOTE — Telephone Encounter (Signed)
Last filled 01-18-16 #60 Last OV 01-18-16 Next OV 08-13-16

## 2016-02-27 ENCOUNTER — Other Ambulatory Visit: Payer: Self-pay

## 2016-02-27 NOTE — Telephone Encounter (Signed)
Approved: #[id number] x 0 

## 2016-02-27 NOTE — Telephone Encounter (Signed)
Last filled 01-18-16 #60 Last OV 01-18-16 Next OV 08-13-16 

## 2016-02-28 MED ORDER — ALPRAZOLAM 0.25 MG PO TABS: 0.2500 mg | ORAL_TABLET | Freq: Three times a day (TID) | ORAL | Status: DC | PRN

## 2016-02-28 NOTE — Telephone Encounter (Signed)
Left refill on voice mail at pharmacy  

## 2016-03-14 ENCOUNTER — Other Ambulatory Visit: Payer: Self-pay | Admitting: Internal Medicine

## 2016-03-14 MED ORDER — HYDROCODONE-ACETAMINOPHEN 7.5-325 MG PO TABS: 1.0000 | ORAL_TABLET | Freq: Two times a day (BID) | ORAL | Status: DC | PRN

## 2016-03-14 NOTE — Telephone Encounter (Signed)
MyChart message sent to patient. Rx up front for pickup

## 2016-03-14 NOTE — Telephone Encounter (Signed)
Last filled 02-15-16 #60 Last  ov 01-18-16 Next OV 08-13-16

## 2016-04-06 ENCOUNTER — Other Ambulatory Visit: Payer: Self-pay

## 2016-04-06 NOTE — Telephone Encounter (Signed)
Last filled 02-28-16 #60 Last OV 01-18-16 Next OV 08-13-16

## 2016-04-07 NOTE — Telephone Encounter (Signed)
Approved: #[id number] x 0 

## 2016-04-09 MED ORDER — ALPRAZOLAM 0.25 MG PO TABS: 0.2500 mg | ORAL_TABLET | Freq: Three times a day (TID) | ORAL | Status: DC | PRN

## 2016-04-09 NOTE — Telephone Encounter (Signed)
Left refill on voice mail at pharmacy  

## 2016-04-11 ENCOUNTER — Telehealth: Payer: Self-pay | Admitting: Internal Medicine

## 2016-04-11 ENCOUNTER — Other Ambulatory Visit: Payer: Self-pay | Admitting: Internal Medicine

## 2016-04-11 MED ORDER — HYDROCODONE-ACETAMINOPHEN 7.5-325 MG PO TABS: 1.0000 | ORAL_TABLET | Freq: Two times a day (BID) | ORAL | Status: DC | PRN

## 2016-04-11 NOTE — Telephone Encounter (Signed)
Last written 03-14-16 #60 Last OV 01-18-16 Next OV 08-13-16

## 2016-04-11 NOTE — Telephone Encounter (Signed)
Pt aware rx is ready for pickup.  

## 2016-04-11 NOTE — Telephone Encounter (Signed)
No DPR on file. Left message for pt to call back. Her Hydrocodone rx is front ready for pickup

## 2016-04-11 NOTE — Telephone Encounter (Signed)
Rx up front and pt aware on MyChart and I left a message for her to call

## 2016-05-07 ENCOUNTER — Ambulatory Visit (INDEPENDENT_AMBULATORY_CARE_PROVIDER_SITE_OTHER): Payer: BLUE CROSS/BLUE SHIELD | Admitting: Internal Medicine

## 2016-05-07 ENCOUNTER — Encounter: Payer: Self-pay | Admitting: Internal Medicine

## 2016-05-07 DIAGNOSIS — G479 Sleep disorder, unspecified: Secondary | ICD-10-CM | POA: Diagnosis not present

## 2016-05-07 DIAGNOSIS — F411 Generalized anxiety disorder: Secondary | ICD-10-CM

## 2016-05-07 MED ORDER — TEMAZEPAM 15 MG PO CAPS: 15.0000 mg | ORAL_CAPSULE | Freq: Every evening | ORAL | 0 refills | Status: DC | PRN

## 2016-05-07 MED ORDER — HYDROCODONE-ACETAMINOPHEN 7.5-325 MG PO TABS: 1.0000 | ORAL_TABLET | Freq: Two times a day (BID) | ORAL | 0 refills | Status: DC | PRN

## 2016-05-07 NOTE — Assessment & Plan Note (Signed)
Alprazolam not really that helpful --will stop this and restart temazepam for prn

## 2016-05-07 NOTE — Progress Notes (Signed)
Pre visit review using our clinic review tool, if applicable. No additional management support is needed unless otherwise documented below in the visit note. 

## 2016-05-07 NOTE — Assessment & Plan Note (Signed)
Severe and disabling Worse now as she awaits the disability hearing results I don't think increased meds are needed now-- hopefully she should be less anxious once she gets results---especially if granted.

## 2016-05-07 NOTE — Progress Notes (Signed)
Subjective:    Patient ID: Rachel Vaughn, female    DOB: 1961/06/24, 55 y.o.   MRN: [id number]  HPI Here due to increased mood issues  Ongoing problems dealing with her disability Would like to be doing more but just not able to Continues to see her psychologist regularly--still helpful for her  Got a dog-- 71 year old rescue This is an excuse for going out every day Enjoys talking to him and he keeps her from being anxious   She is having issues with paranoia and rumination  Biting her fingernails to the nub "Just worrying about nothing"  Had disability hearing Now just waiting--this has made her more anxious (now at 6 weeks and should be soon) So worried about the possibility of a total rejection--- her attorney made her turn down deal for prospective disability due to the back only (which she would have accepted).  Ongoing back pain Still does 2 days per week babysitting Still only uses the alprazolam at night--only works briefly though (probably 5 days per week)  Mild depression Some crying but not like before the citalopram  Current Outpatient Prescriptions on File Prior to Visit  Medication Sig Dispense Refill  . albuterol (PROVENTIL HFA;VENTOLIN HFA) 108 (90 BASE) MCG/ACT inhaler Inhale 2 puffs into the lungs 3 (three) times daily as needed for wheezing or shortness of breath. 1 Inhaler 1  . ALPRAZolam (XANAX) 0.25 MG tablet Take 1 tablet (0.25 mg total) by mouth 3 (three) times daily as needed for anxiety. 60 tablet 0  . busPIRone (BUSPAR) 10 MG tablet take 1 tablet by mouth twice a day 180 tablet 2  . cetirizine (ZYRTEC) 10 MG tablet Take 10 mg by mouth daily.    . citalopram (CELEXA) 20 MG tablet Take 1 tablet (20 mg total) by mouth daily. 90 tablet 1  . fluticasone (FLONASE) 50 MCG/ACT nasal spray Place 2 sprays into both nostrils daily.    Marland Kitchen HYDROcodone-acetaminophen (NORCO) 7.5-325 MG tablet Take 1-2 tablets by mouth 2 (two) times daily as needed. 60 tablet 0    . hydrocortisone 2.5 % cream Apply topically 3 (three) times daily as needed. 28 g 3  . montelukast (SINGULAIR) 10 MG tablet Take 1 tablet (10 mg total) by mouth at bedtime. 30 tablet 11  . valACYclovir (VALTREX) 1000 MG tablet Take 1,000 mg by mouth 2 (two) times daily as needed.     No current facility-administered medications on file prior to visit.     Allergies  Allergen Reactions  . Extract Of [employer]   . Fluoxetine Hcl     REACTION: unspecified  . Paroxetine     REACTION: unspecified  . Venlafaxine     REACTION: unspecified    Past Medical History:  Diagnosis Date  . Anxiety   . Asthma   . Diverticulitis   . GERD (gastroesophageal reflux disease)   . Social anxiety disorder     Past Surgical History:  Procedure Laterality Date  . BREAST ENHANCEMENT SURGERY  1987  . CESAREAN SECTION      Family History  Problem Relation Age of Onset  . Asthma Mother   . COPD Mother   . Diabetes Maternal Grandmother   . Cancer Neg Hx     Social History   Social History  . Marital status: Married    Spouse name: N/A  . Number of children: 2  . Years of education: N/A   Occupational History  . Disabled ---anxiety  Social History Main Topics  . Smoking status: Never Smoker  . Smokeless tobacco: Never Used  . Alcohol use Yes  . Drug use: No  . Sexual activity: Not on file   Other Topics Concern  . Not on file   Social History Narrative  . No narrative on file   Review of Systems  Ongoing sleep problems Appetite is okay     Objective:   Physical Exam  Psychiatric:  Normal speech and appearance Not overly depressed No thought process disturbance          Assessment & Plan:

## 2016-05-17 ENCOUNTER — Other Ambulatory Visit: Payer: Self-pay | Admitting: Internal Medicine

## 2016-06-06 ENCOUNTER — Other Ambulatory Visit: Payer: Self-pay | Admitting: Internal Medicine

## 2016-06-07 NOTE — Telephone Encounter (Signed)
Last filled 05-07-16 #30 Last OV 05-07-16 Next OV 08-13-16

## 2016-06-07 NOTE — Telephone Encounter (Signed)
Approved: 30 x 0 

## 2016-06-07 NOTE — Telephone Encounter (Signed)
Left refill on voice mail at pharmacy  

## 2016-06-09 ENCOUNTER — Other Ambulatory Visit: Payer: Self-pay | Admitting: Internal Medicine

## 2016-06-09 ENCOUNTER — Encounter: Payer: Self-pay | Admitting: Internal Medicine

## 2016-06-12 ENCOUNTER — Other Ambulatory Visit: Payer: Self-pay | Admitting: Internal Medicine

## 2016-06-12 MED ORDER — HYDROCODONE-ACETAMINOPHEN 7.5-325 MG PO TABS: 1.0000 | ORAL_TABLET | Freq: Two times a day (BID) | ORAL | 0 refills | Status: DC | PRN

## 2016-06-12 NOTE — Telephone Encounter (Signed)
MyChart message sent to pt that rx is up front ready for pickup 

## 2016-06-12 NOTE — Telephone Encounter (Signed)
Last printed 05-07-16 #60. Last OV 05-07-16 Next OV 08-13-16

## 2016-07-05 ENCOUNTER — Other Ambulatory Visit: Payer: Self-pay | Admitting: Internal Medicine

## 2016-07-05 NOTE — Telephone Encounter (Signed)
Left refill on voice mail at pharmacy  

## 2016-07-05 NOTE — Telephone Encounter (Signed)
Last filled 06-07-16 #30 Last OV 05-07-16 Next OV 08-13-16

## 2016-07-05 NOTE — Telephone Encounter (Signed)
Approved: 30 x 0 

## 2016-07-06 ENCOUNTER — Other Ambulatory Visit: Payer: Self-pay | Admitting: Internal Medicine

## 2016-07-09 MED ORDER — HYDROCODONE-ACETAMINOPHEN 7.5-325 MG PO TABS: 1.0000 | ORAL_TABLET | Freq: Two times a day (BID) | ORAL | 0 refills | Status: DC | PRN

## 2016-07-09 NOTE — Telephone Encounter (Signed)
Last written 06-12-16 #60 Last OV 05-07-16 Next OV 08-13-16

## 2016-07-09 NOTE — Telephone Encounter (Signed)
MyChart message sent to pt that rx is up front ready for pickup 

## 2016-07-23 ENCOUNTER — Telehealth: Payer: Self-pay | Admitting: Internal Medicine

## 2016-07-23 DIAGNOSIS — Z1211 Encounter for screening for malignant neoplasm of colon: Secondary | ICD-10-CM

## 2016-07-23 NOTE — Telephone Encounter (Signed)
Referral placed [place] in [place] so makes sense to get it there

## 2016-07-23 NOTE — Telephone Encounter (Signed)
Pt called stating she wants to go ahead with getting colonoscopy earlier  She would like to go to [facility] and they need referral fax # [phone] Phone # [phone]

## 2016-08-06 ENCOUNTER — Other Ambulatory Visit: Payer: Self-pay | Admitting: Internal Medicine

## 2016-08-07 ENCOUNTER — Other Ambulatory Visit: Payer: Self-pay | Admitting: Internal Medicine

## 2016-08-07 MED ORDER — HYDROCODONE-ACETAMINOPHEN 7.5-325 MG PO TABS: 1.0000 | ORAL_TABLET | Freq: Two times a day (BID) | ORAL | 0 refills | Status: DC | PRN

## 2016-08-07 MED ORDER — TEMAZEPAM 15 MG PO CAPS: 15.0000 mg | ORAL_CAPSULE | Freq: Every day | ORAL | 0 refills | Status: DC

## 2016-08-07 MED ORDER — ALBUTEROL SULFATE HFA 108 (90 BASE) MCG/ACT IN AERS: 2.0000 | INHALATION_SPRAY | Freq: Four times a day (QID) | RESPIRATORY_TRACT | 0 refills | Status: DC | PRN

## 2016-08-07 NOTE — Telephone Encounter (Signed)
Left refill on voice mail at pharmacy  

## 2016-08-07 NOTE — Telephone Encounter (Signed)
Temazepam last filled 07/05/16.Marland Kitchen.Marland Kitchen.Norco last filled 07/08/2016... Please advise

## 2016-08-07 NOTE — Telephone Encounter (Signed)
Last filled 07-05-16 #30 Last OV 05-07-16 Next OV 08-13-16  Forward to Dr Ermalene SearingBedsole in Dr Karle StarchLetvak's absence

## 2016-08-07 NOTE — Telephone Encounter (Signed)
Last written 07-09-16 #60 Last OV 05-07-16 Next OV 08-13-16  Forwarding to Dr Ermalene SearingBedsole in Dr Karle StarchLetvak's absence

## 2016-08-07 NOTE — Telephone Encounter (Signed)
Medication phoned to pharmacy.  

## 2016-08-07 NOTE — Telephone Encounter (Signed)
Ok to phone in Restoril. RX for [employer] printed and signed and placed in MYD box

## 2016-08-08 NOTE — Telephone Encounter (Signed)
Dr Ermalene SearingBedsole is not in the office today to sign the rx that was printed yesterday. It will be signed in the morning when she returns.

## 2016-08-13 ENCOUNTER — Encounter: Payer: Self-pay | Admitting: Internal Medicine

## 2016-08-13 ENCOUNTER — Ambulatory Visit (INDEPENDENT_AMBULATORY_CARE_PROVIDER_SITE_OTHER): Payer: BLUE CROSS/BLUE SHIELD | Admitting: Internal Medicine

## 2016-08-13 VITALS — BP 98/62 | HR 58 | Temp 97.9°F | Ht 65.25 in | Wt 123.0 lb

## 2016-08-13 DIAGNOSIS — G8929 Other chronic pain: Secondary | ICD-10-CM

## 2016-08-13 DIAGNOSIS — M545 Low back pain: Secondary | ICD-10-CM

## 2016-08-13 DIAGNOSIS — F411 Generalized anxiety disorder: Secondary | ICD-10-CM | POA: Diagnosis not present

## 2016-08-13 DIAGNOSIS — Z23 Encounter for immunization: Secondary | ICD-10-CM | POA: Diagnosis not present

## 2016-08-13 DIAGNOSIS — Z0000 Encounter for general adult medical examination without abnormal findings: Secondary | ICD-10-CM | POA: Diagnosis not present

## 2016-08-13 NOTE — Assessment & Plan Note (Signed)
Continues on the narcotic bid

## 2016-08-13 NOTE — Assessment & Plan Note (Signed)
Severe and disabling Now on SSI due to this Continue same meds

## 2016-08-13 NOTE — Assessment & Plan Note (Signed)
Healthy Discussed exercise as possible (walks dog) Flu vaccine today Mammogram due 10/18 Getting colonoscopy  Pap next year

## 2016-08-13 NOTE — Patient Instructions (Signed)
Please try the capsaicin for your thumbs. If that doesn't work, I can send a prescription for a topical anti inflammatory.

## 2016-08-13 NOTE — Addendum Note (Signed)
Addended by: Eual FinesBRIDGES, [place] P on: 08/13/2016 05:02 PM   Modules accepted: Orders

## 2016-08-13 NOTE — Progress Notes (Signed)
Pre visit review using our clinic review tool, if applicable. No additional management support is needed unless otherwise documented below in the visit note. 

## 2016-08-13 NOTE — Progress Notes (Signed)
Subjective:    Patient ID: Rachel Vaughn, female    DOB: [birth year], 55 y.o.   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 albuterol (VENTOLIN HFA) 108 (90 Base) MCG/ACT inhaler Inhale 2 puffs into the lungs every 6 (six) hours as needed for wheezing or shortness of breath. 18 Inhaler 0  . busPIRone (BUSPAR) 10 MG tablet take 1 tablet by mouth twice a day 180 tablet 2  . cetirizine (ZYRTEC) 10 MG tablet Take 10 mg by mouth daily.    . citalopram (CELEXA) 20 MG tablet take 1 tablet by mouth once daily 90 tablet 1  . fluticasone (FLONASE) 50 MCG/ACT nasal spray Place 2 sprays into both nostrils daily.    Marland Kitchen. HYDROcodone-acetaminophen (NORCO) 7.5-325 MG tablet Take 1-2 tablets by mouth 2 (two) times daily as needed. 60 tablet 0  . hydrocortisone 2.5 % cream Apply topically 3 (three) times daily as needed. 28 g 3  . montelukast (SINGULAIR) 10 MG tablet Take 1 tablet (10 mg total) by mouth at bedtime. 30 tablet 11  . temazepam (RESTORIL) 15 MG capsule Take 1 capsule (15 mg total) by mouth at bedtime. 30 capsule 0  . triamcinolone cream (KENALOG) 0.1 % apply to affected area twice a day AS NEEDED 30 g 2  . valACYclovir (VALTREX) 1000 MG tablet Take 1,000 mg by mouth 2 (two) times daily as needed.     No current facility-administered medications on file prior to visit.     Allergies  Allergen Reactions  . Fluoxetine Hcl     REACTION: unspecified  .  Paroxetine     REACTION: unspecified  . Poison Ivy Extract   . Venlafaxine     REACTION: unspecified    Past Medical History:  Diagnosis Date  . Anxiety   . Asthma   . Diverticulitis   . GERD (gastroesophageal reflux disease)   . Social anxiety disorder     Past Surgical History:  Procedure Laterality Date  . BREAST ENHANCEMENT SURGERY  1987  . CESAREAN SECTION      Family History  Problem Relation Age of Onset  . Asthma Mother   . COPD Mother   . Diabetes Maternal Grandmother   . Cancer Neg Hx     Social History   Social History  . Marital status: Married    Spouse name: N/A  . Number of children: 2  . Years of education: N/A   Occupational History  . Disabled ---anxiety    Social History Main Topics  . Smoking status: Never Smoker  . Smokeless tobacco: Never Used  . Alcohol use Yes  . Drug use: No  . Sexual activity: Not on file   Other Topics Concern  . Not on file   Social History Narrative  . No narrative on file   Review of Systems  Constitutional: Negative for fatigue and unexpected weight change.        Wears seat belt  HENT: Negative  for dental problem, hearing loss, tinnitus and trouble swallowing.        Overdue for dentist  Eyes: Negative for redness and visual disturbance.  Respiratory: Negative for cough, chest tightness and shortness of breath.   Cardiovascular: Positive for chest pain and palpitations. Negative for leg swelling.       [facility] for chest pain and near syncope  Gastrointestinal: Negative for blood in stool and constipation.       No heartburn   Endocrine: Negative for polydipsia and polyuria.  Genitourinary: Negative for dyspareunia, dysuria and hematuria.       No sex--no problem Discussed safe sex  Musculoskeletal: Negative for arthralgias and back pain.  Skin: Negative for rash.       No suspicious lesions  Allergic/Immunologic: Positive for environmental allergies. Negative for immunocompromised  state.       Satisfied with zyrtec  Neurological: Positive for dizziness. Negative for weakness and headaches.       No headaches since off alprazolam  Hematological: Negative for adenopathy. Bruises/bleeds easily.  Psychiatric/Behavioral:       Sleeping okay with the temazepam Episodic depression Daily anxiety       Objective:   Physical Exam  Constitutional: She is oriented to person, place, and time. She appears well-developed and well-nourished. No distress.  HENT:  Head: Normocephalic and atraumatic.  Right Ear: External ear normal.  Left Ear: External ear normal.  Mouth/Throat: Oropharynx is clear and moist. No oropharyngeal exudate.  Eyes: Conjunctivae are normal. Pupils are equal, round, and reactive to light.  Neck: Normal range of motion. Neck supple. No thyromegaly present.  Cardiovascular: Normal rate, regular rhythm, normal heart sounds and intact distal pulses.  Exam reveals no gallop.   No murmur heard. Pulmonary/Chest: Effort normal and breath sounds normal. No respiratory distress. She has no wheezes. She has no rales.  Abdominal: Soft. There is no tenderness.  Musculoskeletal: She exhibits no edema or tenderness.  Mild tenderness in CMC bilaterally No other synoviits  Lymphadenopathy:    She has no cervical adenopathy.  Neurological: She is alert and oriented to person, place, and time.  Skin: No rash noted. No erythema.  Psychiatric: She has a normal mood and affect. Her behavior is normal.          Assessment & Plan:

## 2016-09-03 ENCOUNTER — Other Ambulatory Visit: Payer: Self-pay | Admitting: Internal Medicine

## 2016-09-03 ENCOUNTER — Encounter: Payer: Self-pay | Admitting: Internal Medicine

## 2016-09-03 MED ORDER — TRETINOIN 0.05 % EX CREA: TOPICAL_CREAM | Freq: Every day | CUTANEOUS | 11 refills | Status: DC

## 2016-09-03 MED ORDER — DICLOFENAC SODIUM 1 % TD GEL: 2.0000 g | Freq: Four times a day (QID) | TRANSDERMAL | 11 refills | Status: AC | PRN

## 2016-09-03 NOTE — Telephone Encounter (Signed)
Last OV 08-13-16 Next OV 02-21-17  Temazepam #30 and Hydrocodone #60 Last filled 08-07-16

## 2016-09-04 MED ORDER — HYDROCODONE-ACETAMINOPHEN 7.5-325 MG PO TABS: 1.0000 | ORAL_TABLET | Freq: Two times a day (BID) | ORAL | 0 refills | Status: DC | PRN

## 2016-09-04 MED ORDER — TEMAZEPAM 15 MG PO CAPS: 15.0000 mg | ORAL_CAPSULE | Freq: Every day | ORAL | 0 refills | Status: DC

## 2016-09-04 NOTE — Telephone Encounter (Signed)
Temazepam refill left on voice mail at pharmacy MyChart message sent that hydrocodone is up front

## 2016-09-04 NOTE — Telephone Encounter (Signed)
Needs phone in 

## 2016-09-12 ENCOUNTER — Telehealth: Payer: Self-pay

## 2016-09-12 NOTE — Telephone Encounter (Signed)
PA completed on [email or web address] Pt aware it can be up to 14 days before a response is given

## 2016-09-12 NOTE — Telephone Encounter (Signed)
PA form filled out on CoverMyMeds. Pt is aware it could be up to 14 days before a response is given.

## 2016-09-13 NOTE — Telephone Encounter (Signed)
Fax received with approval for for the lifetime of the plan. Ref #[id number].

## 2016-09-14 MED ORDER — TRETINOIN 0.01 % EX GEL: Freq: Every day | CUTANEOUS | 0 refills | Status: DC

## 2016-09-14 NOTE — Telephone Encounter (Addendum)
[facility] stating pt has to try and fail the alternative Retin-A 0.01% before they will cover the 0.05%. It will also require a PA once.

## 2016-09-14 NOTE — Addendum Note (Signed)
Addended by: Eual FinesBRIDGES, SHANNON P on: 09/14/2016 01:07 PM   Modules accepted: Orders

## 2016-09-14 NOTE — Telephone Encounter (Signed)
Spoke to pt. Advised her of the change

## 2016-09-19 ENCOUNTER — Encounter: Payer: Self-pay | Admitting: Internal Medicine

## 2016-09-20 MED ORDER — PYRANTEL PAMOATE 720.5 MG PO CHEW: CHEWABLE_TABLET | ORAL | 0 refills | Status: DC

## 2016-09-20 NOTE — Telephone Encounter (Signed)
Pt can go ahead and treat if she would like.

## 2016-09-26 ENCOUNTER — Other Ambulatory Visit: Payer: Self-pay

## 2016-09-26 LAB — HM COLONOSCOPY

## 2016-09-26 NOTE — Telephone Encounter (Signed)
Pt left v/m requesting rx hydrocodone apap. Call when ready for pick up. Last printed # 60 on 09/04/16; pt is going to be out of town next week due to Christmas and pt request early rx. Pt last seen 08/13/16.

## 2016-09-27 ENCOUNTER — Encounter: Payer: Self-pay | Admitting: Internal Medicine

## 2016-09-27 MED ORDER — HYDROCODONE-ACETAMINOPHEN 7.5-325 MG PO TABS: 1.0000 | ORAL_TABLET | Freq: Two times a day (BID) | ORAL | 0 refills | Status: DC | PRN

## 2016-09-27 NOTE — Telephone Encounter (Signed)
MyChart message sent to pt that rx is up front ready for pickup 

## 2016-09-27 NOTE — Telephone Encounter (Signed)
Pt sent a message on MyChart: "I left a voice message yesterday about getting my Pain prescription early and haven't heard back. I don't need to fill early just pick up. I will be leaving town Sunday and won't be back until January 4. Was wondering if I can pick up today or tomorrow and have filled at regular date [place]. I am going with my son for a couple of weeks. Thanks, Rachel MullerBelinda Vaughn"

## 2016-10-31 ENCOUNTER — Other Ambulatory Visit: Payer: Self-pay | Admitting: Internal Medicine

## 2016-11-01 MED ORDER — HYDROCODONE-ACETAMINOPHEN 7.5-325 MG PO TABS: 1.0000 | ORAL_TABLET | Freq: Two times a day (BID) | ORAL | 0 refills | Status: DC | PRN

## 2016-11-01 NOTE — Telephone Encounter (Signed)
Last rx printed 09-27-16 #60 Last OV 08-13-16 Next OV 02-11-17

## 2016-11-01 NOTE — Telephone Encounter (Signed)
Spoke to pt. Rx up front ready for pickup 

## 2016-11-09 ENCOUNTER — Other Ambulatory Visit: Payer: Self-pay | Admitting: Internal Medicine

## 2016-11-09 NOTE — Telephone Encounter (Signed)
Left refill on voice mail at pharmacy  

## 2016-11-09 NOTE — Telephone Encounter (Signed)
Last filled 10-10-16 #30 Last OV 08-13-16 Next OV 02-11-17

## 2016-11-09 NOTE — Telephone Encounter (Signed)
Approved: 30 x 0 

## 2016-11-27 ENCOUNTER — Other Ambulatory Visit: Payer: Self-pay | Admitting: Internal Medicine

## 2016-11-27 MED ORDER — HYDROCODONE-ACETAMINOPHEN 7.5-325 MG PO TABS: 1.0000 | ORAL_TABLET | Freq: Two times a day (BID) | ORAL | 0 refills | Status: DC | PRN

## 2016-11-27 NOTE — Telephone Encounter (Signed)
Spoke to pt. Rx up front ready for pickup 

## 2016-11-27 NOTE — Telephone Encounter (Signed)
Last filled 11-01-16 #60 Last OV  08-13-16 Next OV 02-11-17

## 2016-11-28 ENCOUNTER — Other Ambulatory Visit: Payer: Self-pay | Admitting: Internal Medicine

## 2016-12-10 ENCOUNTER — Other Ambulatory Visit: Payer: Self-pay | Admitting: Internal Medicine

## 2016-12-10 NOTE — Telephone Encounter (Signed)
Last filled 11/09/16. Last OV 08/2016-CPE

## 2016-12-11 NOTE — Telephone Encounter (Signed)
Rx called in to requested pharmacy 

## 2016-12-24 ENCOUNTER — Encounter: Payer: Self-pay | Admitting: Internal Medicine

## 2016-12-24 ENCOUNTER — Ambulatory Visit (INDEPENDENT_AMBULATORY_CARE_PROVIDER_SITE_OTHER): Payer: BLUE CROSS/BLUE SHIELD | Admitting: Internal Medicine

## 2016-12-24 VITALS — BP 102/62 | HR 64 | Temp 98.0°F | Wt 122.2 lb

## 2016-12-24 DIAGNOSIS — N950 Postmenopausal bleeding: Secondary | ICD-10-CM

## 2016-12-24 DIAGNOSIS — M545 Low back pain: Secondary | ICD-10-CM

## 2016-12-24 DIAGNOSIS — G8929 Other chronic pain: Secondary | ICD-10-CM

## 2016-12-24 MED ORDER — METHOCARBAMOL 500 MG PO TABS: 500.0000 mg | ORAL_TABLET | Freq: Three times a day (TID) | ORAL | 1 refills | Status: DC | PRN

## 2016-12-24 MED ORDER — HYDROCODONE-ACETAMINOPHEN 7.5-325 MG PO TABS: 1.0000 | ORAL_TABLET | Freq: Two times a day (BID) | ORAL | 0 refills | Status: DC | PRN

## 2016-12-24 NOTE — Progress Notes (Signed)
Subjective:    Patient ID: Rachel Vaughn, female    DOB: [birth year], 56 y.o.   MRN: [id number]  HPI Here due to problems with her back pain Has had pain inbetween pain doses and incomplete Tried taking 2 at the same time Has noted some spasm Has tried healthy posture--- but makes it more painful  Very tough to sweep or vacuum  Has had some vaginal bleeding a month ago More annoying than painful Thin watery blood for about 7 days Not associated with her urination No fever  Current Outpatient Prescriptions on File Prior to Visit  Medication Sig Dispense Refill  . busPIRone (BUSPAR) 10 MG tablet take 1 tablet by mouth twice a day 180 tablet 2  . cetirizine (ZYRTEC) 10 MG tablet Take 10 mg by mouth daily.    . citalopram (CELEXA) 20 MG tablet take 1 tablet by mouth once daily 90 tablet 1  . diclofenac sodium (VOLTAREN) 1 % GEL Apply 2 g topically 4 (four) times daily as needed. To hands 200 g 11  . fluticasone (FLONASE) 50 MCG/ACT nasal spray Place 2 sprays into both nostrils daily.    Marland Kitchen. HYDROcodone-acetaminophen (NORCO) 7.5-325 MG tablet Take 1-2 tablets by mouth 2 (two) times daily as needed. 60 tablet 0  . hydrocortisone 2.5 % cream Apply topically 3 (three) times daily as needed. 28 g 3  . montelukast (SINGULAIR) 10 MG tablet take 1 tablet by mouth at bedtime 30 tablet 11  . temazepam (RESTORIL) 15 MG capsule TAKE 1 CAPSULE BY MOUTH AT BEDTIME AS NEEDED 30 capsule 0  . tretinoin (RETIN-A) 0.01 % gel Apply topically at bedtime. 45 g 0  . triamcinolone cream (KENALOG) 0.1 % apply to affected area twice a day AS NEEDED 30 g 2  . valACYclovir (VALTREX) 1000 MG tablet Take 1,000 mg by mouth 2 (two) times daily as needed.    . VENTOLIN HFA 108 (90 Base) MCG/ACT inhaler inhale 2 puffs by mouth every 6 hours if needed for wheezing or SHORTNESS OF BREATH. USE WITH SPACER. 18 Inhaler 11   No current facility-administered medications on file prior to visit.     Allergies  Allergen  Reactions  . Fluoxetine Hcl     REACTION: unspecified  . Paroxetine     REACTION: unspecified  . Poison Ivy Extract   . Venlafaxine     REACTION: unspecified    Past Medical History:  Diagnosis Date  . Anxiety   . Asthma   . Diverticulitis   . GERD (gastroesophageal reflux disease)   . Social anxiety disorder     Past Surgical History:  Procedure Laterality Date  . BREAST ENHANCEMENT SURGERY  1987  . CESAREAN SECTION      Family History  Problem Relation Age of Onset  . Asthma Mother   . COPD Mother   . Diabetes Maternal Grandmother   . Cancer Neg Hx     Social History   Social History  . Marital status: Married    Spouse name: N/A  . Number of children: 2  . Years of education: N/A   Occupational History  . Disabled ---anxiety    Social History Main Topics  . Smoking status: Never Smoker  . Smokeless tobacco: Never Used  . Alcohol use Yes  . Drug use: No  . Sexual activity: Not on file   Other Topics Concern  . Not on file   Social History Narrative  . No narrative on file   Review  of Systems  Not sleeping as well but able to sleep with the temazepam Long depressing winter Appetite is good Weight about the same     Objective:   Physical Exam  Musculoskeletal:  No spine tenderness Fairly normal flexion of spine SLR negative Normal ROM of hips--though it causes some back pain  Psychiatric:  Mild anxiety          Assessment & Plan:

## 2016-12-24 NOTE — Progress Notes (Signed)
Pre visit review using our clinic review tool, if applicable. No additional management support is needed unless otherwise documented below in the visit note. 

## 2016-12-24 NOTE — Assessment & Plan Note (Signed)
Will add a muscle relaxant If ongoing problems, [place] [facility] [place] [facility]

## 2016-12-24 NOTE — Assessment & Plan Note (Signed)
Happened once about a month ago Will check ultrasound If recurs, will need gyn evaluation PAP/HPV negative about 2 years ago

## 2016-12-25 NOTE — Addendum Note (Signed)
Addended by: Eual FinesBRIDGES, SHANNON P on: 12/25/2016 10:55 AM   Modules accepted: Orders

## 2016-12-27 ENCOUNTER — Ambulatory Visit
Admission: RE | Admit: 2016-12-27 | Discharge: 2016-12-27 | Disposition: A | Discharge status: Home / Self Care | Payer: BLUE CROSS/BLUE SHIELD | Source: Ambulatory Visit | Attending: Internal Medicine | Admitting: Internal Medicine

## 2016-12-27 DIAGNOSIS — R938 Abnormal findings on diagnostic imaging of other specified body structures: Secondary | ICD-10-CM | POA: Diagnosis not present

## 2016-12-27 DIAGNOSIS — N950 Postmenopausal bleeding: Secondary | ICD-10-CM

## 2017-01-08 ENCOUNTER — Other Ambulatory Visit: Payer: Self-pay | Admitting: Internal Medicine

## 2017-01-08 NOTE — Telephone Encounter (Signed)
Last filled 12-11-16 #30 Last OV Acute 12-24-16 Next OV 02-11-17

## 2017-01-09 NOTE — Telephone Encounter (Signed)
Approved: 30 x 0 

## 2017-01-09 NOTE — Telephone Encounter (Signed)
Left refill on voice mail at pharmacy  

## 2017-01-24 ENCOUNTER — Other Ambulatory Visit: Payer: Self-pay | Admitting: Internal Medicine

## 2017-01-24 MED ORDER — HYDROCODONE-ACETAMINOPHEN 7.5-325 MG PO TABS
1.0000 | ORAL_TABLET | Freq: Two times a day (BID) | ORAL | 0 refills | Status: DC | PRN
Start: 2017-01-24 — End: 2017-02-25

## 2017-01-24 NOTE — Telephone Encounter (Signed)
Last office visit 12/24/16.  Last refilled 12/24/16 for #60 with no refills.  Ok to refill?

## 2017-01-24 NOTE — Telephone Encounter (Signed)
Spoke to pt and informed her Rx is available for pickup from the front desk. Pt advised third party unable to pickup 

## 2017-01-25 ENCOUNTER — Encounter: Payer: Self-pay | Admitting: Internal Medicine

## 2017-01-25 ENCOUNTER — Other Ambulatory Visit: Payer: BLUE CROSS/BLUE SHIELD

## 2017-01-25 DIAGNOSIS — Z0283 Encounter for blood-alcohol and blood-drug test: Secondary | ICD-10-CM

## 2017-02-02 LAB — TOXASSURE SELECT 13 (MW), URINE

## 2017-02-06 ENCOUNTER — Other Ambulatory Visit: Payer: Self-pay | Admitting: Internal Medicine

## 2017-02-06 NOTE — Telephone Encounter (Signed)
Last filled 01-09-17 #30 Last OV 12-24-16 Next OV 02-11-17

## 2017-02-06 NOTE — Telephone Encounter (Signed)
Left refill on voice mail at pharmacy  

## 2017-02-06 NOTE — Telephone Encounter (Signed)
Approved: 30 x 0 

## 2017-02-11 ENCOUNTER — Encounter: Payer: Self-pay | Admitting: Internal Medicine

## 2017-02-11 ENCOUNTER — Ambulatory Visit (INDEPENDENT_AMBULATORY_CARE_PROVIDER_SITE_OTHER): Payer: BLUE CROSS/BLUE SHIELD | Admitting: Internal Medicine

## 2017-02-11 VITALS — BP 124/74 | HR 60 | Temp 98.0°F | Wt 125.0 lb

## 2017-02-11 DIAGNOSIS — M545 Low back pain: Secondary | ICD-10-CM

## 2017-02-11 DIAGNOSIS — N950 Postmenopausal bleeding: Secondary | ICD-10-CM | POA: Diagnosis not present

## 2017-02-11 DIAGNOSIS — F411 Generalized anxiety disorder: Secondary | ICD-10-CM

## 2017-02-11 DIAGNOSIS — G8929 Other chronic pain: Secondary | ICD-10-CM | POA: Diagnosis not present

## 2017-02-11 NOTE — Progress Notes (Signed)
Pre visit review using our clinic review tool, if applicable. No additional management support is needed unless otherwise documented below in the visit note. 

## 2017-02-11 NOTE — Assessment & Plan Note (Signed)
She will [place]

## 2017-02-11 NOTE — Assessment & Plan Note (Signed)
Ongoing pain Some better with the night muscle relaxer Checked CSRS--nothing except from me

## 2017-02-11 NOTE — Progress Notes (Signed)
Subjective:    Patient ID: Rachel Vaughn, female    DOB: December 01, 1960, 56 y.o.   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 busPIRone (BUSPAR) 10 MG tablet take 1 tablet by mouth twice a day 180 tablet 2  . cetirizine (ZYRTEC) 10 MG tablet Take 10 mg by mouth daily.    . citalopram (CELEXA) 20 MG tablet take 1 tablet by mouth once daily 90 tablet 0  . diclofenac sodium (VOLTAREN) 1 % GEL Apply 2 g topically 4 (four) times daily as needed. To hands 200 g 11  . fluticasone (FLONASE) 50 MCG/ACT nasal spray Place 2 sprays into both nostrils daily.    Marland Kitchen. HYDROcodone-acetaminophen (NORCO) 7.5-325 MG tablet Take 1-2 tablets by mouth 2 (two) times daily as needed. 60 tablet 0  . hydrocortisone 2.5 % cream Apply topically 3 (three) times daily as needed. 28 g 3  . methocarbamol (ROBAXIN) 500 MG tablet Take 1 tablet (500 mg total) by mouth 3 (three) times daily as needed for muscle spasms. 90 tablet 1  . montelukast (SINGULAIR) 10 MG tablet take 1 tablet by mouth at bedtime 30 tablet 11  . temazepam (RESTORIL) 15 MG capsule take 1 capsule by mouth at bedtime if needed 30 capsule 0  . tretinoin (RETIN-A) 0.01 % gel Apply topically at bedtime. 45 g 0  . triamcinolone cream (KENALOG) 0.1 % apply to affected area twice a day AS NEEDED 30 g 2  . valACYclovir (VALTREX) 1000 MG tablet Take 1,000 mg by mouth 2 (two) times daily as needed.    . VENTOLIN HFA 108 (90 Base) MCG/ACT inhaler inhale 2 puffs by mouth every 6 hours if needed for wheezing or  SHORTNESS OF BREATH. USE WITH SPACER. 18 Inhaler 11   No current facility-administered medications on file prior to visit.     Allergies  Allergen Reactions  . Fluoxetine Hcl     REACTION: unspecified  . Paroxetine     REACTION: unspecified  . Poison Ivy Extract   . Venlafaxine     REACTION: unspecified    Past Medical History:  Diagnosis Date  . Anxiety   . Asthma   . Diverticulitis   . GERD (gastroesophageal reflux disease)   . Social anxiety disorder     Past Surgical History:  Procedure Laterality Date  . BREAST ENHANCEMENT SURGERY  1987  . CESAREAN SECTION      Family History  Problem Relation Age of Onset  . Asthma Mother   . COPD Mother   . Diabetes Maternal Grandmother   . Cancer Neg Hx     Social History   Social History  . Marital status: Married    Spouse name: N/A  . Number of children: 2  . Years of education: N/A   Occupational History  . Disabled ---anxiety    Social History Main Topics  . Smoking status: Never Smoker  . Smokeless tobacco: Never Used  . Alcohol use Yes  . Drug  use: No  . Sexual activity: Not on file   Other Topics Concern  . Not on file   Social History Narrative  . No narrative on file   Review of Systems  Appetite is okay---craves sweets Weight stable Sleeping fair Did have another bout of vaginal bleeding "like a period"      Objective:   Physical Exam  Constitutional: She appears well-nourished. No distress.  Neck: No thyromegaly present.  Cardiovascular: Normal rate, regular rhythm and normal heart sounds.  Exam reveals no gallop.   No murmur heard. Pulmonary/Chest: Effort normal and breath sounds normal. No respiratory distress. She has no wheezes. She has no rales.  Abdominal: Soft. There is no tenderness.  Musculoskeletal: She exhibits no edema or tenderness.  Lymphadenopathy:    She has no cervical adenopathy.  Psychiatric:  Anxious but her usual          Assessment & Plan:

## 2017-02-11 NOTE — Assessment & Plan Note (Signed)
Chronic and stable.   

## 2017-02-11 NOTE — Patient Instructions (Signed)
Please set up with a gynecologist due to the bleeding.

## 2017-02-25 ENCOUNTER — Other Ambulatory Visit: Payer: Self-pay | Admitting: Internal Medicine

## 2017-02-25 MED ORDER — HYDROCODONE-ACETAMINOPHEN 7.5-325 MG PO TABS: 1.0000 | ORAL_TABLET | Freq: Two times a day (BID) | ORAL | 0 refills | Status: DC | PRN

## 2017-02-25 NOTE — Telephone Encounter (Signed)
RX printed and signed and given to Shannon 

## 2017-02-25 NOTE — Telephone Encounter (Signed)
Last filled 01-24-17 Last OV 02-11-17 Next OV 08-19-17  [facility] Dr Karle StarchLetvak's absence. Please give back to me when approved/denied. Thanks

## 2017-03-13 ENCOUNTER — Other Ambulatory Visit: Payer: Self-pay | Admitting: Internal Medicine

## 2017-03-13 ENCOUNTER — Other Ambulatory Visit: Payer: Self-pay

## 2017-03-13 MED ORDER — TEMAZEPAM 15 MG PO CAPS: ORAL_CAPSULE | ORAL | 0 refills | Status: DC

## 2017-03-13 NOTE — Telephone Encounter (Signed)
Approved: 30 x 0 

## 2017-03-13 NOTE — Telephone Encounter (Signed)
Left refill on voice mail at pharmacy  

## 2017-03-13 NOTE — Telephone Encounter (Signed)
Last filled 02-06-17 #30 Last OV 02-11-17 Next OV 08-19-17   UDS 01-28-17

## 2017-03-20 ENCOUNTER — Ambulatory Visit (INDEPENDENT_AMBULATORY_CARE_PROVIDER_SITE_OTHER): Payer: BLUE CROSS/BLUE SHIELD | Admitting: Internal Medicine

## 2017-03-20 ENCOUNTER — Encounter: Payer: Self-pay | Admitting: Internal Medicine

## 2017-03-20 VITALS — BP 114/62 | HR 84 | Temp 98.9°F | Wt 124.0 lb

## 2017-03-20 DIAGNOSIS — F3341 Major depressive disorder, recurrent, in partial remission: Secondary | ICD-10-CM | POA: Insufficient documentation

## 2017-03-20 DIAGNOSIS — F321 Major depressive disorder, single episode, moderate: Secondary | ICD-10-CM | POA: Diagnosis not present

## 2017-03-20 DIAGNOSIS — F3342 Major depressive disorder, recurrent, in full remission: Secondary | ICD-10-CM | POA: Insufficient documentation

## 2017-03-20 MED ORDER — METHYLPHENIDATE HCL 5 MG PO TABS: 5.0000 mg | ORAL_TABLET | Freq: Two times a day (BID) | ORAL | 0 refills | Status: DC

## 2017-03-20 NOTE — Assessment & Plan Note (Signed)
Chronic anxiety but now with 1 month of daily, life altering depressed mood Discussed options for add on Rx----ritalin, SNRI (duloxetine), antipsychotic Will try ritalin Early follow up

## 2017-03-20 NOTE — Progress Notes (Signed)
Subjective:    Patient ID: Rachel Vaughn, female    DOB: 1961-05-24, 56 y.o.   MRN: [id number]  HPI Here due to depression "So sad all the time" Feels drained--- hard to even do regular house and yard work Eating cookies and lying in bed On computer-- getting impulsive thoughts, like having to sell her place, move, etc. Will then turn off computer  "I have the dumbest thoughts---if I get sweaty I will have to shower" and then loses motivation Family keeping up with her--they know she is having a hard time No suicidal thoughts "I feel helpless and hopeless"--then feels guilty for "what I have"  This has been over the past month Always small bursts with the depression --but now day after day  Binging on cookies---then feels horrible  Still sees counselor weekly She finds this helpful usually  Current Outpatient Prescriptions on File Prior to Visit  Medication Sig Dispense Refill  . busPIRone (BUSPAR) 10 MG tablet take 1 tablet by mouth twice a day 180 tablet 2  . cetirizine (ZYRTEC) 10 MG tablet Take 10 mg by mouth daily.    . citalopram (CELEXA) 20 MG tablet take 1 tablet by mouth once daily 90 tablet 0  . diclofenac sodium (VOLTAREN) 1 % GEL Apply 2 g topically 4 (four) times daily as needed. To hands 200 g 11  . fluticasone (FLONASE) 50 MCG/ACT nasal spray Place 2 sprays into both nostrils daily.    Marland Kitchen. HYDROcodone-acetaminophen (NORCO) 7.5-325 MG tablet Take 1-2 tablets by mouth 2 (two) times daily as needed. 60 tablet 0  . hydrocortisone 2.5 % cream Apply topically 3 (three) times daily as needed. 28 g 3  . methocarbamol (ROBAXIN) 500 MG tablet Take 1 tablet (500 mg total) by mouth 3 (three) times daily as needed for muscle spasms. 90 tablet 1  . montelukast (SINGULAIR) 10 MG tablet take 1 tablet by mouth at bedtime 30 tablet 11  . temazepam (RESTORIL) 15 MG capsule take 1 capsule by mouth at bedtime if needed 30 capsule 0  . tretinoin (RETIN-A) 0.01 % gel Apply topically at  bedtime. 45 g 0  . triamcinolone cream (KENALOG) 0.1 % apply to affected area twice a day AS NEEDED 30 g 2  . valACYclovir (VALTREX) 1000 MG tablet Take 1,000 mg by mouth 2 (two) times daily as needed.    . VENTOLIN HFA 108 (90 Base) MCG/ACT inhaler inhale 2 puffs by mouth every 6 hours if needed for wheezing or SHORTNESS OF BREATH. USE WITH SPACER. 18 Inhaler 11   No current facility-administered medications on file prior to visit.     Allergies  Allergen Reactions  . Fluoxetine Hcl     REACTION: unspecified  . Paroxetine     REACTION: unspecified  . Poison Ivy Extract   . Venlafaxine     REACTION: unspecified    Past Medical History:  Diagnosis Date  . Anxiety   . Asthma   . Diverticulitis   . GERD (gastroesophageal reflux disease)   . Social anxiety disorder     Past Surgical History:  Procedure Laterality Date  . BREAST ENHANCEMENT SURGERY  1987  . CESAREAN SECTION      Family History  Problem Relation Age of Onset  . Asthma Mother   . COPD Mother   . Diabetes Maternal Grandmother   . Cancer Neg Hx     Social History   Social History  . Marital status: Married    Spouse name: N/A  .  Number of children: 2  . Years of education: N/A   Occupational History  . Disabled ---anxiety    Social History Main Topics  . Smoking status: Never Smoker  . Smokeless tobacco: Never Used  . Alcohol use Yes  . Drug use: No  . Sexual activity: Not on file   Other Topics Concern  . Not on file   Social History Narrative  . No narrative on file   Review of Systems Not sleeping that well at night--- just lies in bed during the day watching TV Chronic back pain is about the same--takes the hydrocodone at 8AM and 1 PM---then pain gets bad again by 4PM    Objective:   Physical Exam  Constitutional: She appears well-developed.  Psychiatric:  Normal appearance and speech No psychomotor retardation No thought process abnormalities (no psychosis)            Assessment & Plan:

## 2017-03-20 NOTE — Patient Instructions (Signed)
Please start the methylphenidate with 1/2 tab twice a day (generally around 8AM and 1PM)--and then increase to a full tab twice a day in 3 days if you have no problems.

## 2017-03-25 ENCOUNTER — Other Ambulatory Visit: Payer: Self-pay | Admitting: Internal Medicine

## 2017-03-25 NOTE — Telephone Encounter (Signed)
Last filled 02/25/2017... Please advise 

## 2017-03-26 MED ORDER — HYDROCODONE-ACETAMINOPHEN 7.5-325 MG PO TABS: 1.0000 | ORAL_TABLET | Freq: Two times a day (BID) | ORAL | 0 refills | Status: DC | PRN

## 2017-03-26 NOTE — Telephone Encounter (Signed)
Spoke to pt and informed her Rx is available for pickup from the front desk 

## 2017-03-29 ENCOUNTER — Ambulatory Visit: Payer: BLUE CROSS/BLUE SHIELD | Admitting: Internal Medicine

## 2017-04-04 ENCOUNTER — Ambulatory Visit (INDEPENDENT_AMBULATORY_CARE_PROVIDER_SITE_OTHER): Payer: BLUE CROSS/BLUE SHIELD | Admitting: Internal Medicine

## 2017-04-04 ENCOUNTER — Encounter: Payer: Self-pay | Admitting: Internal Medicine

## 2017-04-04 VITALS — BP 108/82 | HR 60 | Temp 97.5°F | Wt 121.0 lb

## 2017-04-04 DIAGNOSIS — F321 Major depressive disorder, single episode, moderate: Secondary | ICD-10-CM | POA: Diagnosis not present

## 2017-04-04 NOTE — Assessment & Plan Note (Signed)
Better now Seems to have had a good response to the methylphenidate Will plan 3-6 months before trial of wean Continue buspar and citalopram ---probably indefinitely

## 2017-04-04 NOTE — Progress Notes (Signed)
Subjective:    Patient ID: Rachel Vaughn, female    DOB: 1961/06/26, 56 y.o.   MRN: [id number]  HPI Here for follow up of depression  Feels that the 1PM dose has helped her afternoons Searching for her biologic father again Spent a long time with counselor (Dr Rachel Vaughn) yesterday--for the stress "I need some answers--- I want to own my past"older sister finally talked to her about this Now knows she had a different father from her siblings She feels the medication has allowed her to get out of bed and take all these actions  Was able to clean house for the first time in a while Still with trouble getting out of the house Daily depressed mood persists --bu much less severe (and not incapacitated and anhedonic)  Current Outpatient Prescriptions on File Prior to Visit  Medication Sig Dispense Refill  . busPIRone (BUSPAR) 10 MG tablet take 1 tablet by mouth twice a day 180 tablet 2  . cetirizine (ZYRTEC) 10 MG tablet Take 10 mg by mouth daily.    . citalopram (CELEXA) 20 MG tablet take 1 tablet by mouth once daily 90 tablet 0  . diclofenac sodium (VOLTAREN) 1 % GEL Apply 2 g topically 4 (four) times daily as needed. To hands 200 g 11  . fluticasone (FLONASE) 50 MCG/ACT nasal spray Place 2 sprays into both nostrils daily.    Marland Kitchen. HYDROcodone-acetaminophen (NORCO) 7.5-325 MG tablet Take 1-2 tablets by mouth 2 (two) times daily as needed. 60 tablet 0  . hydrocortisone 2.5 % cream Apply topically 3 (three) times daily as needed. 28 g 3  . methocarbamol (ROBAXIN) 500 MG tablet Take 1 tablet (500 mg total) by mouth 3 (three) times daily as needed for muscle spasms. 90 tablet 1  . methylphenidate (RITALIN) 5 MG tablet Take 1 tablet (5 mg total) by mouth 2 (two) times daily. 60 tablet 0  . montelukast (SINGULAIR) 10 MG tablet take 1 tablet by mouth at bedtime 30 tablet 11  . temazepam (RESTORIL) 15 MG capsule take 1 capsule by mouth at bedtime if needed 30 capsule 0  . tretinoin (RETIN-A) 0.01 % gel  Apply topically at bedtime. 45 g 0  . triamcinolone cream (KENALOG) 0.1 % apply to affected area twice a day AS NEEDED 30 g 2  . valACYclovir (VALTREX) 1000 MG tablet Take 1,000 mg by mouth 2 (two) times daily as needed.    . VENTOLIN HFA 108 (90 Base) MCG/ACT inhaler inhale 2 puffs by mouth every 6 hours if needed for wheezing or SHORTNESS OF BREATH. USE WITH SPACER. 18 Inhaler 11   No current facility-administered medications on file prior to visit.     Allergies  Allergen Reactions  . Fluoxetine Hcl     REACTION: unspecified  . Paroxetine     REACTION: unspecified  . Poison Ivy Extract   . Venlafaxine     REACTION: unspecified    Past Medical History:  Diagnosis Date  . Anxiety   . Asthma   . Diverticulitis   . GERD (gastroesophageal reflux disease)   . Social anxiety disorder     Past Surgical History:  Procedure Laterality Date  . BREAST ENHANCEMENT SURGERY  1987  . CESAREAN SECTION      Family History  Problem Relation Age of Onset  . Asthma Mother   . COPD Mother   . Diabetes Maternal Grandmother   . Cancer Neg Hx     Social History   Social History  .  Marital status: Married    Spouse name: N/A  . Number of children: 2  . Years of education: N/A   Occupational History  . Disabled ---anxiety    Social History Main Topics  . Smoking status: Never Smoker  . Smokeless tobacco: Never Used  . Alcohol use Yes  . Drug use: No  . Sexual activity: Not on file   Other Topics Concern  . Not on file   Social History Narrative  . No narrative on file   Review of Systems Appetite is okay--cutting back on cookies Weight is down slightly Sleeps okay    Objective:   Physical Exam  Psychiatric:  More upbeat Excited about the family research she is doing No overt depression Speech is normal           Assessment & Plan:

## 2017-04-08 ENCOUNTER — Other Ambulatory Visit: Payer: Self-pay | Admitting: Internal Medicine

## 2017-04-09 NOTE — Telephone Encounter (Signed)
Last filled 03-13-17 #30 Last OV 04-04-17 Next OV 05-16-17

## 2017-04-09 NOTE — Telephone Encounter (Signed)
Left refill on voice mail at pharmacy  

## 2017-04-09 NOTE — Telephone Encounter (Signed)
Approved: 30 x 0 

## 2017-04-16 ENCOUNTER — Other Ambulatory Visit: Payer: Self-pay | Admitting: Internal Medicine

## 2017-04-16 MED ORDER — HYDROCODONE-ACETAMINOPHEN 7.5-325 MG PO TABS: 1.0000 | ORAL_TABLET | Freq: Two times a day (BID) | ORAL | 0 refills | Status: DC | PRN

## 2017-04-16 MED ORDER — METHYLPHENIDATE HCL 5 MG PO TABS: 5.0000 mg | ORAL_TABLET | Freq: Two times a day (BID) | ORAL | 0 refills | Status: DC

## 2017-04-16 NOTE — Telephone Encounter (Signed)
Methylphenidate last printed 03-20-17 #60 Hydrocodone last printed 03-26-17 #60  Last OV 04-04-17 Next OV 05-16-17  Last UDS 01-28-17  Will let Dr Alphonsus SiasLetvak decide about the hydrocodone rx

## 2017-04-16 NOTE — Telephone Encounter (Signed)
I told her we would try to get these on the same schedule

## 2017-04-16 NOTE — Telephone Encounter (Signed)
Left message to call office. Rxs up front ready for pickup.

## 2017-05-09 ENCOUNTER — Other Ambulatory Visit: Payer: Self-pay | Admitting: Internal Medicine

## 2017-05-13 ENCOUNTER — Other Ambulatory Visit: Payer: Self-pay | Admitting: Internal Medicine

## 2017-05-13 MED ORDER — TEMAZEPAM 15 MG PO CAPS: 15.0000 mg | ORAL_CAPSULE | Freq: Every evening | ORAL | 0 refills | Status: DC | PRN

## 2017-05-13 NOTE — Telephone Encounter (Signed)
Approved: 30 x 0 

## 2017-05-13 NOTE — Telephone Encounter (Signed)
Last filled 04-09-17 #30 Last OV 04-04-17 Next OV 05-16-17

## 2017-05-13 NOTE — Telephone Encounter (Signed)
Pt wants med refilled today.Medication phoned to pharmacy as instructed. [facility]. Pt voiced understanding.

## 2017-05-16 ENCOUNTER — Encounter: Payer: Self-pay | Admitting: Internal Medicine

## 2017-05-16 ENCOUNTER — Ambulatory Visit (INDEPENDENT_AMBULATORY_CARE_PROVIDER_SITE_OTHER): Payer: BLUE CROSS/BLUE SHIELD | Admitting: Internal Medicine

## 2017-05-16 VITALS — BP 108/70 | HR 61 | Temp 98.1°F | Wt 120.0 lb

## 2017-05-16 DIAGNOSIS — F321 Major depressive disorder, single episode, moderate: Secondary | ICD-10-CM | POA: Diagnosis not present

## 2017-05-16 DIAGNOSIS — F1120 Opioid dependence, uncomplicated: Secondary | ICD-10-CM

## 2017-05-16 MED ORDER — METHYLPHENIDATE HCL 5 MG PO TABS: 5.0000 mg | ORAL_TABLET | Freq: Two times a day (BID) | ORAL | 0 refills | Status: DC

## 2017-05-16 MED ORDER — HYDROCODONE-ACETAMINOPHEN 7.5-325 MG PO TABS: 1.0000 | ORAL_TABLET | Freq: Two times a day (BID) | ORAL | 0 refills | Status: DC | PRN

## 2017-05-16 NOTE — Assessment & Plan Note (Signed)
Discussed non pharmacologic Rx Will continue hydrocodone CSRS--no Rx other than here

## 2017-05-16 NOTE — Assessment & Plan Note (Signed)
Better now Thinking about moving now--at least renting--in [place] and excited about the possibility (but trying to not go off the deep end). Will continue both meds for now

## 2017-05-16 NOTE — Progress Notes (Signed)
Subjective:    Patient ID: Rachel Vaughn, female    DOB: June 13, 1961, 56 y.o.   MRN: [id number]  HPI Here for follow up of depression and for narcotic monitoring  Mood has been better States it as "okay" Son just moved to [place] (from [place]) for at least 4 years Considering going back to [place] been down there for some holidays Working with sister "to make amends" Still has property in [place]--- put on market and got contract but fell through May just rent and try [place]--without burning her bridges Finds the work on all this (on internet) has given her some drive Afraid to leave me and therapist, Dr Alycia Rossetti  Back is getting worse Less active now Does try to do yoga if she is in pain--usually helps Still using the hydrocodone regularly at 8:30AM and 1:30PM Tries to stay busy at home --then will use heating pad  Current Outpatient Prescriptions on File Prior to Visit  Medication Sig Dispense Refill  . busPIRone (BUSPAR) 10 MG tablet take 1 tablet by mouth twice a day 180 tablet 2  . cetirizine (ZYRTEC) 10 MG tablet Take 10 mg by mouth daily.    . citalopram (CELEXA) 20 MG tablet TAKE 1 TABLET BY MOUTH ONCE DAILY 90 tablet 0  . diclofenac sodium (VOLTAREN) 1 % GEL Apply 2 g topically 4 (four) times daily as needed. To hands 200 g 11  . fluticasone (FLONASE) 50 MCG/ACT nasal spray Place 2 sprays into both nostrils daily.    Marland Kitchen HYDROcodone-acetaminophen (NORCO) 7.5-325 MG tablet Take 1-2 tablets by mouth 2 (two) times daily as needed. Fill in about 1 week 60 tablet 0  . hydrocortisone 2.5 % cream Apply topically 3 (three) times daily as needed. 28 g 3  . methocarbamol (ROBAXIN) 500 MG tablet Take 1 tablet (500 mg total) by mouth 3 (three) times daily as needed for muscle spasms. 90 tablet 1  . methylphenidate (RITALIN) 5 MG tablet Take 1 tablet (5 mg total) by mouth 2 (two) times daily. 60 tablet 0  . temazepam (RESTORIL) 15 MG capsule Take 1 capsule (15 mg total) by mouth  at bedtime as needed for sleep. 30 capsule 0  . tretinoin (RETIN-A) 0.01 % gel Apply topically at bedtime. 45 g 0  . triamcinolone cream (KENALOG) 0.1 % apply to affected area twice a day AS NEEDED 30 g 2  . valACYclovir (VALTREX) 1000 MG tablet Take 1,000 mg by mouth 2 (two) times daily as needed.    . VENTOLIN HFA 108 (90 Base) MCG/ACT inhaler inhale 2 puffs by mouth every 6 hours if needed for wheezing or SHORTNESS OF BREATH. USE WITH SPACER. 18 Inhaler 11   No current facility-administered medications on file prior to visit.     Allergies  Allergen Reactions  . Fluoxetine Hcl     REACTION: unspecified  . Paroxetine     REACTION: unspecified  . Poison Ivy Extract   . Venlafaxine     REACTION: unspecified    Past Medical History:  Diagnosis Date  . Anxiety   . Asthma   . Diverticulitis   . GERD (gastroesophageal reflux disease)   . Social anxiety disorder     Past Surgical History:  Procedure Laterality Date  . BREAST ENHANCEMENT SURGERY  1987  . CESAREAN SECTION      Family History  Problem Relation Age of Onset  . Asthma Mother   . COPD Mother   . Diabetes Maternal Grandmother   .  Cancer Neg Hx     Social History   Social History  . Marital status: Married    Spouse name: N/A  . Number of children: 2  . Years of education: N/A   Occupational History  . Disabled ---anxiety    Social History Main Topics  . Smoking status: Never Smoker  . Smokeless tobacco: Never Used  . Alcohol use Yes  . Drug use: No  . Sexual activity: Not on file   Other Topics Concern  . Not on file   Social History Narrative  . No narrative on file   Review of Systems  Appetite is okay--still has trouble getting out to the supermarket at times Sleep is generally okay     Objective:   Physical Exam  Constitutional: She appears well-nourished. No distress.  Psychiatric:  Normal speech and appearance Not depressed Appropriate affect          Assessment & Plan:

## 2017-05-17 ENCOUNTER — Telehealth: Payer: Self-pay

## 2017-05-17 NOTE — Telephone Encounter (Signed)
PA has been faxed to [employer] [facility] [employer] [facility] [employer] [facility] [place] per fax received from pharmacy...Marland Kitchen. Awaiting response and PA form place in [place] PA waiting folder

## 2017-05-23 NOTE — Telephone Encounter (Signed)
PA for 5mg  4 pills daily is denied. They will cover up to 3 daily. They said the same dosage is available in 10mg  twice a day and that is covered. Please make appropriate changes.

## 2017-05-23 NOTE — Telephone Encounter (Signed)
Spoke to pt. She will think about how she will decide to take it. She said she will call me back tomorrow

## 2017-05-23 NOTE — Telephone Encounter (Signed)
Please check with her If she really takes the 2 every time--change Rx to 10mg  bid (#60 x 0) If not, she will need to get only #90 at a time

## 2017-05-24 NOTE — Telephone Encounter (Signed)
Spoke to pt. She said she wants to do the 10mg  twice a day when the time comes. She was able to get this last rx.

## 2017-06-11 ENCOUNTER — Other Ambulatory Visit: Payer: Self-pay | Admitting: Internal Medicine

## 2017-06-11 MED ORDER — TEMAZEPAM 15 MG PO CAPS: 15.0000 mg | ORAL_CAPSULE | Freq: Every evening | ORAL | 0 refills | Status: DC | PRN

## 2017-06-11 NOTE — Telephone Encounter (Signed)
Approved: 30 x 0 

## 2017-06-11 NOTE — Telephone Encounter (Signed)
Last filled 05-13-17 #30 Last OV 05-16-17 Next OV 08-19-17

## 2017-06-11 NOTE — Telephone Encounter (Signed)
Left refill on voice mail at pharmacy  

## 2017-07-01 ENCOUNTER — Encounter: Payer: Self-pay | Admitting: Internal Medicine

## 2017-07-08 ENCOUNTER — Other Ambulatory Visit: Payer: Self-pay | Admitting: Internal Medicine

## 2017-07-08 MED ORDER — TEMAZEPAM 15 MG PO CAPS: 15.0000 mg | ORAL_CAPSULE | Freq: Every evening | ORAL | 0 refills | Status: DC | PRN

## 2017-07-08 NOTE — Telephone Encounter (Signed)
Last filled #30 06-11-17 Last OV 05-16-17 Next OV 08-19-17

## 2017-07-08 NOTE — Telephone Encounter (Signed)
Left refill on voice mail at pharmacy  

## 2017-07-08 NOTE — Telephone Encounter (Signed)
Approved: 30 x 0 

## 2017-07-15 ENCOUNTER — Other Ambulatory Visit: Payer: Self-pay | Admitting: Internal Medicine

## 2017-07-15 MED ORDER — METHYLPHENIDATE HCL 5 MG PO TABS: 5.0000 mg | ORAL_TABLET | Freq: Two times a day (BID) | ORAL | 0 refills | Status: DC

## 2017-07-15 MED ORDER — HYDROCODONE-ACETAMINOPHEN 7.5-325 MG PO TABS: 1.0000 | ORAL_TABLET | Freq: Two times a day (BID) | ORAL | 0 refills | Status: DC | PRN

## 2017-07-15 NOTE — Telephone Encounter (Signed)
Both last filled 05-16-17 #120 Last OV 05-16-17 Next OV 08-19-17

## 2017-07-15 NOTE — Telephone Encounter (Signed)
MyChart message sent to pt

## 2017-07-25 ENCOUNTER — Ambulatory Visit (INDEPENDENT_AMBULATORY_CARE_PROVIDER_SITE_OTHER): Payer: BLUE CROSS/BLUE SHIELD

## 2017-07-25 DIAGNOSIS — Z23 Encounter for immunization: Secondary | ICD-10-CM

## 2017-08-05 ENCOUNTER — Other Ambulatory Visit: Payer: Self-pay

## 2017-08-05 MED ORDER — TEMAZEPAM 15 MG PO CAPS: 15.0000 mg | ORAL_CAPSULE | Freq: Every evening | ORAL | 0 refills | Status: DC | PRN

## 2017-08-05 MED ORDER — CITALOPRAM HYDROBROMIDE 20 MG PO TABS: 20.0000 mg | ORAL_TABLET | Freq: Every day | ORAL | 3 refills | Status: DC

## 2017-08-05 NOTE — Telephone Encounter (Signed)
Temazepam refill on voice mail at pharmacy Citalopram Rx sent electronically.

## 2017-08-05 NOTE — Telephone Encounter (Signed)
Copied from CRM #[id number]. Topic: Inquiry >> Aug 05, 2017 10:22 AM Anice PaganiniMunoz, Schneur Crowson I, NT wrote: Reason for CRM: [place] and need her refills of citalaopram and temacem

## 2017-08-05 NOTE — Telephone Encounter (Signed)
Approved: #30 x 0 for temazepam Citalopram for a year

## 2017-08-05 NOTE — Telephone Encounter (Signed)
Temazepam last called in 07-08-17. Last OV 05-16-17 Next OV 08-19-17

## 2017-08-05 NOTE — Telephone Encounter (Signed)
Copied from CRM #[id number]. Topic: Inquiry >> Aug 05, 2017 10:22 AM Anice PaganiniMunoz, Cruz I, NT wrote: Reason for CRM: [place] and need her refills of citalaopram and temacem  >> Aug 05, 2017 10:44 AM Arlyss Gandyichardson, Taren N, NT wrote: Pt is in [place] and needs her rx filled for her temazepam and citalopram. [facility] [place] the address is [address] [phone]. Tele: [phone]

## 2017-08-19 ENCOUNTER — Ambulatory Visit (INDEPENDENT_AMBULATORY_CARE_PROVIDER_SITE_OTHER): Payer: BLUE CROSS/BLUE SHIELD | Admitting: Internal Medicine

## 2017-08-19 ENCOUNTER — Encounter: Payer: Self-pay | Admitting: Internal Medicine

## 2017-08-19 VITALS — BP 112/64 | HR 64 | Temp 98.1°F | Ht 65.0 in | Wt 118.0 lb

## 2017-08-19 DIAGNOSIS — F1120 Opioid dependence, uncomplicated: Secondary | ICD-10-CM

## 2017-08-19 DIAGNOSIS — F411 Generalized anxiety disorder: Secondary | ICD-10-CM | POA: Diagnosis not present

## 2017-08-19 DIAGNOSIS — F321 Major depressive disorder, single episode, moderate: Secondary | ICD-10-CM | POA: Diagnosis not present

## 2017-08-19 DIAGNOSIS — Z0000 Encounter for general adult medical examination without abnormal findings: Secondary | ICD-10-CM

## 2017-08-19 LAB — CBC
HEMATOCRIT: 40.6 % (ref 36.0–46.0)
HEMOGLOBIN: 13.4 g/dL (ref 12.0–15.0)
MCHC: 33.1 g/dL (ref 30.0–36.0)
MCV: 97.6 fl (ref 78.0–100.0)
PLATELETS: 224 10*3/uL (ref 150.0–400.0)
RBC: 4.16 Mil/uL (ref 3.87–5.11)
RDW: 13.3 % (ref 11.5–15.5)
WBC: 6.9 10*3/uL (ref 4.0–10.5)

## 2017-08-19 LAB — COMPREHENSIVE METABOLIC PANEL
ALK PHOS: 55 U/L (ref 39–117)
ALT: 13 U/L (ref 0–35)
AST: 20 U/L (ref 0–37)
Albumin: 4.3 g/dL (ref 3.5–5.2)
BUN: 14 mg/dL (ref 6–23)
CO2: 30 meq/L (ref 19–32)
Calcium: 10 mg/dL (ref 8.4–10.5)
Chloride: 101 mEq/L (ref 96–112)
Creatinine, Ser: 0.81 mg/dL (ref 0.40–1.20)
GFR: 77.3 mL/min (ref >60.00)
GLUCOSE: 101 mg/dL — AB (ref 70–99)
POTASSIUM: 4.5 meq/L (ref 3.5–5.1)
SODIUM: 137 meq/L (ref 135–145)
TOTAL PROTEIN: 7.3 g/dL (ref 6.0–8.3)
Total Bilirubin: 0.3 mg/dL (ref 0.2–1.2)

## 2017-08-19 LAB — T4, FREE: Free T4: 0.7 ng/dL (ref 0.60–1.60)

## 2017-08-19 MED ORDER — TEMAZEPAM 15 MG PO CAPS
15.0000 mg | ORAL_CAPSULE | Freq: Every evening | ORAL | 0 refills | Status: DC | PRN
Start: 2017-08-19 — End: 2017-10-22

## 2017-08-19 MED ORDER — HYDROCODONE-ACETAMINOPHEN 7.5-325 MG PO TABS: 1.0000 | ORAL_TABLET | Freq: Two times a day (BID) | ORAL | 0 refills | Status: DC | PRN

## 2017-08-19 NOTE — Assessment & Plan Note (Signed)
Worse with stress Will go back to up to 2 temazepam a day

## 2017-08-19 NOTE — Assessment & Plan Note (Signed)
Still on the hydrocodone---tries to limit to bid Reviewed CSRS

## 2017-08-19 NOTE — Assessment & Plan Note (Signed)
Better with the methylphenidate

## 2017-08-19 NOTE — Assessment & Plan Note (Signed)
Healthy but still emotional issues Lots of stress Mammogram is due Just had negative colon last year PAP due 2021 Had flu vaccine
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
## 2017-08-19 NOTE — Patient Instructions (Signed)
Please set up your screening mammogram. 

## 2017-09-04 ENCOUNTER — Other Ambulatory Visit: Payer: Self-pay | Admitting: Internal Medicine

## 2017-09-09 DIAGNOSIS — R69 Illness, unspecified: Secondary | ICD-10-CM | POA: Diagnosis not present

## 2017-09-11 DIAGNOSIS — R69 Illness, unspecified: Secondary | ICD-10-CM | POA: Diagnosis not present

## 2017-09-12 DIAGNOSIS — Z0100 Encounter for examination of eyes and vision without abnormal findings: Secondary | ICD-10-CM | POA: Diagnosis not present

## 2017-09-18 DIAGNOSIS — R69 Illness, unspecified: Secondary | ICD-10-CM | POA: Diagnosis not present

## 2017-09-24 DIAGNOSIS — H5203 Hypermetropia, bilateral: Secondary | ICD-10-CM | POA: Diagnosis not present

## 2017-09-24 DIAGNOSIS — H524 Presbyopia: Secondary | ICD-10-CM | POA: Diagnosis not present

## 2017-09-24 DIAGNOSIS — H52221 Regular astigmatism, right eye: Secondary | ICD-10-CM | POA: Diagnosis not present

## 2017-09-25 DIAGNOSIS — R69 Illness, unspecified: Secondary | ICD-10-CM | POA: Diagnosis not present

## 2017-10-16 DIAGNOSIS — R69 Illness, unspecified: Secondary | ICD-10-CM | POA: Diagnosis not present

## 2017-10-22 ENCOUNTER — Other Ambulatory Visit: Payer: Self-pay | Admitting: Internal Medicine

## 2017-10-22 MED ORDER — METHYLPHENIDATE HCL 5 MG PO TABS: 5.0000 mg | ORAL_TABLET | Freq: Two times a day (BID) | ORAL | 0 refills | Status: DC

## 2017-10-22 MED ORDER — HYDROCODONE-ACETAMINOPHEN 7.5-325 MG PO TABS: 1.0000 | ORAL_TABLET | Freq: Two times a day (BID) | ORAL | 0 refills | Status: DC | PRN

## 2017-10-22 MED ORDER — TEMAZEPAM 15 MG PO CAPS: 15.0000 mg | ORAL_CAPSULE | Freq: Every evening | ORAL | 0 refills | Status: DC | PRN

## 2017-10-22 NOTE — Telephone Encounter (Signed)
Please let her know that the prescriptions are now being sent electronically! She doesn't need to pick up the prescriptions any more. This is really good news for her since she lives in [place]

## 2017-10-22 NOTE — Telephone Encounter (Signed)
Last Rx 07/2017 and 08/2017. Last OV 08/2017

## 2017-10-22 NOTE — Telephone Encounter (Signed)
MyChart message sent to pt

## 2017-10-23 ENCOUNTER — Telehealth: Payer: Self-pay | Admitting: Internal Medicine

## 2017-10-23 MED ORDER — METHYLPHENIDATE HCL 5 MG PO TABS: 5.0000 mg | ORAL_TABLET | Freq: Two times a day (BID) | ORAL | 0 refills | Status: DC

## 2017-10-23 MED ORDER — TEMAZEPAM 15 MG PO CAPS: 15.0000 mg | ORAL_CAPSULE | Freq: Every evening | ORAL | 0 refills | Status: DC | PRN

## 2017-10-23 MED ORDER — HYDROCODONE-ACETAMINOPHEN 7.5-325 MG PO TABS: 1.0000 | ORAL_TABLET | Freq: Two times a day (BID) | ORAL | 0 refills | Status: DC | PRN

## 2017-10-23 NOTE — Telephone Encounter (Signed)
Copied from CRM [id number]. Topic: General - Other >> Oct 23, 2017  1:21 PM Lelon FrohlichGolden, Malakhai Beitler, [place] wrote: Reason for CRM: Pt called and [facility] [address] in [place] [phone] and pt needs her [facility] and pt needs to know what to do to have those changed since they are controlled Pt also stated that she would need to update her contract so that the pharmacy is correct please contact pt at [phone]

## 2017-10-23 NOTE — Telephone Encounter (Signed)
I sent her a MyChart message to see if she wanted Dr Alphonsus SiasLetvak to send them to a different pharmacy.

## 2017-10-23 NOTE — Telephone Encounter (Signed)
Prescriptions resent to [facility]. Please cancel the others. She can update her contract at her next appt

## 2017-10-23 NOTE — Telephone Encounter (Signed)
[facility] to void the prescriptions sent yesterday.

## 2017-10-30 DIAGNOSIS — R69 Illness, unspecified: Secondary | ICD-10-CM | POA: Diagnosis not present

## 2017-11-06 ENCOUNTER — Telehealth: Payer: Self-pay | Admitting: Internal Medicine

## 2017-11-06 NOTE — Telephone Encounter (Signed)
Called listed number and it is a dating phone line.

## 2017-11-06 NOTE — Telephone Encounter (Signed)
Copied from CRM [id number]. Topic: Quick Communication - See Telephone Encounter >> Nov 06, 2017  1:10 PM Guinevere FerrariMorris, Atoya Andrew E, NT wrote: CRM for notification. See Telephone encounter for: Misty StanleyLisa is calling from the pre authorization department to get directions for patient's temazepam (RESTORIL) 15 MG capsule and would like a call back at [phone]  11/06/17.

## 2017-11-06 NOTE — Telephone Encounter (Signed)
Attempted to return call however I got a dating service advertisement at the [phone] number.   I hung up.

## 2017-11-07 NOTE — Telephone Encounter (Signed)
Fax received indicating insurance will only cover 30 tabs/ 30 days. Pt will need new Rx sent to pharmacy with quantity 30 or possible instruction change if pt will need to take 2 tabs qhs

## 2017-11-07 NOTE — Telephone Encounter (Signed)
Left a message for pt to call office.  PEC, if pt calls back, please advise her of Dr Karle StarchLetvak's note. Thnaks

## 2017-11-07 NOTE — Telephone Encounter (Signed)
Pt called back and stated everything was taken care of when the pharmacy's was changed.

## 2017-11-07 NOTE — Telephone Encounter (Signed)
Let her know about this She may want to pay cash to keep the same quantity---it may not be that much money

## 2017-11-13 DIAGNOSIS — R69 Illness, unspecified: Secondary | ICD-10-CM | POA: Diagnosis not present

## 2017-11-21 ENCOUNTER — Ambulatory Visit (INDEPENDENT_AMBULATORY_CARE_PROVIDER_SITE_OTHER): Payer: Medicare HMO | Admitting: Internal Medicine

## 2017-11-21 ENCOUNTER — Encounter: Payer: Self-pay | Admitting: Internal Medicine

## 2017-11-21 VITALS — BP 132/84 | HR 63 | Temp 97.9°F | Wt 119.2 lb

## 2017-11-21 DIAGNOSIS — F1120 Opioid dependence, uncomplicated: Secondary | ICD-10-CM | POA: Diagnosis not present

## 2017-11-21 DIAGNOSIS — G8929 Other chronic pain: Secondary | ICD-10-CM

## 2017-11-21 DIAGNOSIS — M545 Low back pain: Secondary | ICD-10-CM

## 2017-11-21 DIAGNOSIS — F321 Major depressive disorder, single episode, moderate: Secondary | ICD-10-CM

## 2017-11-21 DIAGNOSIS — R69 Illness, unspecified: Secondary | ICD-10-CM | POA: Diagnosis not present

## 2017-11-21 NOTE — Assessment & Plan Note (Signed)
Controlled for the most part

## 2017-11-21 NOTE — Assessment & Plan Note (Signed)
Reviewed CSRS--no concerns Still getting #120 of the hydrocodone about every 2 months
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
## 2017-11-21 NOTE — Assessment & Plan Note (Signed)
Improved overall Mostly still with her anxiety Ritalin continues to help with this

## 2017-11-27 DIAGNOSIS — R69 Illness, unspecified: Secondary | ICD-10-CM | POA: Diagnosis not present

## 2017-11-27 LAB — PAIN MGMT, PROFILE 8 W/CONF, U
6 ACETYLMORPHINE: NEGATIVE ng/mL (ref <10)
ALCOHOL METABOLITES: NEGATIVE ng/mL (ref <500)
ALPHAHYDROXYALPRAZOLAM: NEGATIVE ng/mL (ref <25)
ALPHAHYDROXYTRIAZOLAM: NEGATIVE ng/mL (ref <50)
AMINOCLONAZEPAM: NEGATIVE ng/mL (ref <25)
AMPHETAMINES: NEGATIVE ng/mL (ref <500)
Alphahydroxymidazolam: NEGATIVE ng/mL (ref <50)
BUPRENORPHINE, URINE: NEGATIVE ng/mL (ref <5)
Benzodiazepines: POSITIVE ng/mL — AB (ref <100)
CODEINE: NEGATIVE ng/mL (ref <50)
Cocaine Metabolite: NEGATIVE ng/mL (ref <150)
Creatinine: 34.1 mg/dL
HYDROCODONE: 792 ng/mL — AB (ref <50)
HYDROMORPHONE: NEGATIVE ng/mL (ref <50)
HYDROXYETHYLFLURAZEPAM: NEGATIVE ng/mL (ref <50)
Lorazepam: NEGATIVE ng/mL (ref <50)
MARIJUANA METABOLITE: NEGATIVE ng/mL (ref <20)
MDMA: NEGATIVE ng/mL (ref <500)
Morphine: NEGATIVE ng/mL (ref <50)
Nordiazepam: NEGATIVE ng/mL (ref <50)
Norhydrocodone: 1905 ng/mL — ABNORMAL HIGH (ref <50)
OXAZEPAM: 451 ng/mL — AB (ref <50)
OXYCODONE: NEGATIVE ng/mL (ref <100)
Opiates: POSITIVE ng/mL — AB (ref <100)
Oxidant: NEGATIVE ug/mL (ref <200)
Temazepam: >1000 ng/mL — ABNORMAL HIGH (ref <50)
pH: 6.12 (ref 4.5–9.0)

## 2017-12-11 DIAGNOSIS — R69 Illness, unspecified: Secondary | ICD-10-CM | POA: Diagnosis not present

## 2017-12-12 ENCOUNTER — Other Ambulatory Visit: Payer: Self-pay | Admitting: Internal Medicine

## 2017-12-12 MED ORDER — HYDROCODONE-ACETAMINOPHEN 7.5-325 MG PO TABS: 1.0000 | ORAL_TABLET | Freq: Two times a day (BID) | ORAL | 0 refills | Status: DC | PRN

## 2017-12-12 MED ORDER — METHYLPHENIDATE HCL 5 MG PO TABS: 5.0000 mg | ORAL_TABLET | Freq: Two times a day (BID) | ORAL | 0 refills | Status: DC

## 2017-12-12 MED ORDER — TEMAZEPAM 15 MG PO CAPS
15.0000 mg | ORAL_CAPSULE | Freq: Every evening | ORAL | 0 refills | Status: DC | PRN
Start: 2017-12-12 — End: 2018-01-14

## 2017-12-12 NOTE — Telephone Encounter (Signed)
Controlled substances need to await PCp back on Monday.

## 2017-12-12 NOTE — Telephone Encounter (Signed)
All 3 last written 10-23-17  Last OV 11-21-17 Next OV 02-20-18 Last UDS/CSA 11-21-17  Forwarding to Dr Ermalene SearingBedsole in Dr Karle StarchLetvak's absence

## 2017-12-25 DIAGNOSIS — R69 Illness, unspecified: Secondary | ICD-10-CM | POA: Diagnosis not present

## 2018-01-08 DIAGNOSIS — R69 Illness, unspecified: Secondary | ICD-10-CM | POA: Diagnosis not present

## 2018-01-14 ENCOUNTER — Other Ambulatory Visit: Payer: Self-pay | Admitting: Internal Medicine

## 2018-01-14 MED ORDER — TEMAZEPAM 15 MG PO CAPS: 15.0000 mg | ORAL_CAPSULE | Freq: Every evening | ORAL | 0 refills | Status: DC | PRN

## 2018-01-14 NOTE — Telephone Encounter (Signed)
Last Rx 12/12/17. Last OV 11/2017. pls advise

## 2018-01-29 DIAGNOSIS — R69 Illness, unspecified: Secondary | ICD-10-CM | POA: Diagnosis not present

## 2018-02-12 DIAGNOSIS — R69 Illness, unspecified: Secondary | ICD-10-CM | POA: Diagnosis not present

## 2018-02-13 ENCOUNTER — Ambulatory Visit (INDEPENDENT_AMBULATORY_CARE_PROVIDER_SITE_OTHER): Payer: Medicare HMO | Admitting: Internal Medicine

## 2018-02-13 ENCOUNTER — Encounter: Payer: Self-pay | Admitting: Internal Medicine

## 2018-02-13 VITALS — BP 124/62 | HR 58 | Temp 97.7°F | Ht 65.0 in | Wt 121.5 lb

## 2018-02-13 DIAGNOSIS — F1120 Opioid dependence, uncomplicated: Secondary | ICD-10-CM

## 2018-02-13 DIAGNOSIS — G8929 Other chronic pain: Secondary | ICD-10-CM | POA: Diagnosis not present

## 2018-02-13 DIAGNOSIS — R69 Illness, unspecified: Secondary | ICD-10-CM | POA: Diagnosis not present

## 2018-02-13 DIAGNOSIS — Z7189 Other specified counseling: Secondary | ICD-10-CM | POA: Diagnosis not present

## 2018-02-13 DIAGNOSIS — K580 Irritable bowel syndrome with diarrhea: Secondary | ICD-10-CM

## 2018-02-13 DIAGNOSIS — M545 Low back pain: Secondary | ICD-10-CM | POA: Diagnosis not present

## 2018-02-13 DIAGNOSIS — Z7185 Encounter for immunization safety counseling: Secondary | ICD-10-CM

## 2018-02-13 DIAGNOSIS — K589 Irritable bowel syndrome without diarrhea: Secondary | ICD-10-CM | POA: Insufficient documentation

## 2018-02-13 NOTE — Patient Instructions (Signed)
Let me know if the diarrhea isn't better--and I will send that prescription for you. You will need to check about whether your insurance will cover an MMR vaccine (and if here or at a pharmacy).

## 2018-02-13 NOTE — Assessment & Plan Note (Signed)
Clear history of functional bowel issues No loss of appetite, weight or abdominal pain She has just started some yogurt (with live cultures) Okay to use imodium rarely--like if going out to eat Consider cholestyramine

## 2018-02-13 NOTE — Assessment & Plan Note (Signed)
CSRS is fine 

## 2018-02-13 NOTE — Assessment & Plan Note (Signed)
Doing okay with the bid hydrocodone

## 2018-02-13 NOTE — Assessment & Plan Note (Signed)
Not sure if she ever got MMR---thinks her dad may have fudged the paperwork Will update MMR now given measles issue (she will check with insurance)

## 2018-02-13 NOTE — Progress Notes (Signed)
Patient ID: Rachel Vaughn, female    DOB: 1961/01/30, 57 y.o.   MRN: [id number]  HPI Here for follow up of chronic back pain, narcotic dependence and anxiety  Has noted some troubling diarrhea for a while now Usually has to go to the bathroom right after eating --sometimes when still eating Some incontinence in past  Has always gone a lot--but now worse No abdominal pain Only blood on toilet paper and some in bowl from hemorrhoids (chronic) Colonoscopy 1.5 years ago was fine  Bought some OTC med for diarrhea --didn't take BRAT diet helped when bad Did try yogurt--hard to tell if any help  Continues with her therapist for the anxiety  Back pain is about the same Hydrocodone bid still Temazepam nightly and occasionally in day Continues to use the methylphenidate--helps her energy  Current Outpatient Medications on File Prior to Visit  Medication Sig Dispense Refill  . busPIRone (BUSPAR) 10 MG tablet take 1 tablet by mouth twice a day 180 tablet 2  . cetirizine (ZYRTEC) 10 MG tablet Take 10 mg by mouth daily.    . citalopram (CELEXA) 20 MG tablet Take 1 tablet (20 mg total) by mouth daily. 90 tablet 3  . diclofenac sodium (VOLTAREN) 1 % GEL Apply 2 g topically 4 (four) times daily as needed. To hands 200 g 11  . fluticasone (FLONASE) 50 MCG/ACT nasal spray Place 2 sprays into both nostrils daily.    Marland Kitchen HYDROcodone-acetaminophen (NORCO) 7.5-325 MG tablet Take 1-2 tablets by mouth 2 (two) times daily as needed. 120 tablet 0  . hydrocortisone 2.5 % cream Apply topically 3 (three) times daily as needed. 28 g 3  . methocarbamol (ROBAXIN) 500 MG tablet Take 1 tablet (500 mg total) by mouth 3 (three) times daily as needed for muscle spasms. 90 tablet 1  . methylphenidate (RITALIN) 5 MG tablet Take 1-2 tablets (5-10 mg total) by mouth 2 (two) times daily. 120 tablet 0  . temazepam (RESTORIL) 15 MG capsule Take 1-2 capsules (15-30 mg total) by mouth at bedtime as needed for sleep.  60 capsule 0  . tretinoin (RETIN-A) 0.01 % gel Apply topically at bedtime. 45 g 0  . triamcinolone cream (KENALOG) 0.1 % apply to affected area twice a day AS NEEDED 30 g 2  . valACYclovir (VALTREX) 1000 MG tablet Take 1,000 mg by mouth 2 (two) times daily as needed.    . VENTOLIN HFA 108 (90 Base) MCG/ACT inhaler INHALE 2 PUFFS BY MOUTH EVERY 6 HOURS IF NEEDED FOR WHEEZING OR SHORTNESS OF BREATH. USE WITH SPACER 18 g 2   No current facility-administered medications on file prior to visit.     Allergies  Allergen Reactions  . Fluoxetine Hcl     REACTION: unspecified  . Paroxetine     REACTION: unspecified  . Poison Ivy Extract   . Venlafaxine     REACTION: unspecified    Past Medical History:  Diagnosis Date  . Anxiety   . Asthma   . Diverticulitis   . GERD (gastroesophageal reflux disease)   . Social anxiety disorder     Past Surgical History:  Procedure Laterality Date  . BREAST ENHANCEMENT SURGERY  1987  . CESAREAN SECTION      Family History  Problem Relation Age of Onset  . Asthma Mother   . COPD Mother   . Diabetes Maternal Grandmother   . Cancer Neg Hx     Social History   Socioeconomic History  .  Marital status: Divorced    Spouse name: Not on file  . Number of children: 2  . Years of education: Not on file  . Highest education level: Not on file  Occupational History  . Occupation: Disabled ---anxiety  Social Needs  . Financial resource strain: Not on file  . Food insecurity:    Worry: Not on file    Inability: Not on file  . Transportation needs:    Medical: Not on file    Non-medical: Not on file  Tobacco Use  . Smoking status: Never Smoker  . Smokeless tobacco: Never Used  Substance and Sexual Activity  . Alcohol use: Yes  . Drug use: No  . Sexual activity: Not on file  Lifestyle  . Physical activity:    Days per week: Not on file    Minutes per session: Not on file  . Stress: Not on file  Relationships  . Social connections:     Talks on phone: Not on file    Gets together: Not on file    Attends religious service: Not on file    Active member of club or organization: Not on file    Attends meetings of clubs or organizations: Not on file    Relationship status: Not on file  . Intimate partner violence:    Fear of current or ex partner: Not on file    Emotionally abused: Not on file    Physically abused: Not on file    Forced sexual activity: Not on file  Other Topics Concern  . Not on file  Social History Narrative  . Not on file   Review of Systems Appetite is fine Weight is fairly stable (lost weight during trip to son in [place], but regained it) Appetite is fine Does get out briefly--- like to grocery store     Objective:   Physical Exam  Constitutional: She appears well-developed. No distress.  Cardiovascular: Normal rate, regular rhythm and normal heart sounds.  Pulmonary/Chest: Effort normal and breath sounds normal. No respiratory distress. She has no wheezes. She has no rales.  Abdominal: Soft. Bowel sounds are normal. She exhibits no distension and no mass. There is no tenderness. There is no guarding.  Psychiatric: She has a normal mood and affect. Her behavior is normal.          Assessment & Plan:

## 2018-02-20 ENCOUNTER — Ambulatory Visit: Payer: Medicare HMO | Admitting: Internal Medicine

## 2018-02-21 ENCOUNTER — Other Ambulatory Visit: Payer: Self-pay | Admitting: Internal Medicine

## 2018-02-21 MED ORDER — METHYLPHENIDATE HCL 5 MG PO TABS: 5.0000 mg | ORAL_TABLET | Freq: Two times a day (BID) | ORAL | 0 refills | Status: DC

## 2018-02-21 MED ORDER — HYDROCODONE-ACETAMINOPHEN 7.5-325 MG PO TABS: 1.0000 | ORAL_TABLET | Freq: Two times a day (BID) | ORAL | 0 refills | Status: DC | PRN

## 2018-02-21 NOTE — Telephone Encounter (Signed)
Both last filled 12-12-17 #120  Last OV 02-13-18 Next OV 05-22-18  UDS/CSA 11-21-17  Forwarding to [facility] in Dr Karle Starch absence

## 2018-02-26 DIAGNOSIS — R69 Illness, unspecified: Secondary | ICD-10-CM | POA: Diagnosis not present

## 2018-02-27 ENCOUNTER — Other Ambulatory Visit: Payer: Self-pay | Admitting: Internal Medicine

## 2018-02-28 MED ORDER — TEMAZEPAM 15 MG PO CAPS: 15.0000 mg | ORAL_CAPSULE | Freq: Every evening | ORAL | 0 refills | Status: DC | PRN

## 2018-02-28 NOTE — Telephone Encounter (Signed)
Last filled 01-14-18 #60 Last OV 02-13-18 Next OV 05-22-18

## 2018-03-12 DIAGNOSIS — R69 Illness, unspecified: Secondary | ICD-10-CM | POA: Diagnosis not present

## 2018-03-14 ENCOUNTER — Telehealth: Payer: Self-pay

## 2018-03-14 NOTE — Telephone Encounter (Signed)
Spoke to pt. She will be gone for a month. Advised her to just let us know which pharmacy she will be using while she is there. She will be back in time for her follow-up.

## 2018-03-14 NOTE — Telephone Encounter (Signed)
Copied from CRM [id number]. Topic: Quick Communication - See Telephone Encounter >> Mar 14, 2018 12:49 PM Waymon AmatoBurton, Donna F wrote: Pt is wanting to know if she can come in and sign a new contract because she is going to [place] to help with her sister and will need her meds and does she ned to find out the name of the new pharmacy forst  Best number [phone]

## 2018-03-19 DIAGNOSIS — R69 Illness, unspecified: Secondary | ICD-10-CM | POA: Diagnosis not present

## 2018-04-21 ENCOUNTER — Other Ambulatory Visit: Payer: Self-pay | Admitting: Internal Medicine

## 2018-04-21 MED ORDER — CITALOPRAM HYDROBROMIDE 20 MG PO TABS: 20.0000 mg | ORAL_TABLET | Freq: Every day | ORAL | 0 refills | Status: DC

## 2018-04-21 MED ORDER — TEMAZEPAM 15 MG PO CAPS: 15.0000 mg | ORAL_CAPSULE | Freq: Every evening | ORAL | 0 refills | Status: DC | PRN

## 2018-04-21 MED ORDER — BUSPIRONE HCL 10 MG PO TABS: 10.0000 mg | ORAL_TABLET | Freq: Two times a day (BID) | ORAL | 0 refills | Status: DC

## 2018-04-21 MED ORDER — HYDROCODONE-ACETAMINOPHEN 7.5-325 MG PO TABS: 1.0000 | ORAL_TABLET | Freq: Two times a day (BID) | ORAL | 0 refills | Status: DC | PRN

## 2018-04-21 NOTE — Telephone Encounter (Signed)
Copied from CRM [id number]. Topic: Quick Communication - Rx Refill/Question >> Apr 21, 2018 12:27 PM Maia PettiesOrtiz, Kristie S wrote: Medication: pt is needing refills on her meds while she is in [place] - she is needing hydrocodone 30 days, citalopram 90 days, busPIRone 90 days, and temazepam 30 days Has the patient contacted their pharmacy? No - new RXs needed because she is out of states Preferred Pharmacy (with phone number or street name): [facility] [address] [place] [address] [phone] (Phone) [phone] (Fax)

## 2018-04-22 NOTE — Telephone Encounter (Signed)
Hydrocodone is for a chronic low back pain M54.5. I spoke with Rachel Vaughn. Rachel Vaughn said that was all that was needed.

## 2018-04-22 NOTE — Telephone Encounter (Signed)
[facility] Rachel Vaughn is calling phone [phone]  they need to know   for hydrocodone is it chronic pain or acute pain

## 2018-05-22 ENCOUNTER — Encounter: Payer: Self-pay | Admitting: Internal Medicine

## 2018-05-22 ENCOUNTER — Ambulatory Visit (INDEPENDENT_AMBULATORY_CARE_PROVIDER_SITE_OTHER): Payer: Medicare HMO | Admitting: Internal Medicine

## 2018-05-22 VITALS — BP 110/70 | HR 61 | Temp 97.9°F | Ht 65.0 in | Wt 121.0 lb

## 2018-05-22 DIAGNOSIS — F1320 Sedative, hypnotic or anxiolytic dependence, uncomplicated: Secondary | ICD-10-CM | POA: Diagnosis not present

## 2018-05-22 DIAGNOSIS — M545 Low back pain: Secondary | ICD-10-CM | POA: Diagnosis not present

## 2018-05-22 DIAGNOSIS — F1120 Opioid dependence, uncomplicated: Secondary | ICD-10-CM

## 2018-05-22 DIAGNOSIS — G8929 Other chronic pain: Secondary | ICD-10-CM

## 2018-05-22 DIAGNOSIS — Z23 Encounter for immunization: Secondary | ICD-10-CM

## 2018-05-22 DIAGNOSIS — R69 Illness, unspecified: Secondary | ICD-10-CM | POA: Diagnosis not present

## 2018-05-22 NOTE — Assessment & Plan Note (Signed)
Doing okay with the hydrocodone Heat, etc

## 2018-05-22 NOTE — Assessment & Plan Note (Signed)
Checked CSRS No issues Now in [place]--discussed transfer of care

## 2018-05-22 NOTE — Progress Notes (Signed)
Subjective:    Patient ID: Rachel Vaughn, female    DOB: [birth year], 57 y.o.   MRN: [id number]  HPI Here for follow up of chronic pain and anxiety  Sold her town home in [place] Sold [place] first day [place]back renting and will decide  She feels somewhat unburdened Hard not seeing counselor--going on Monday Taking it one day at a time Still gets anxious about every day things ---like shopping for groceries  Chronic back pain Nephew helped with the move--so not too bad Takes the hydrocodone early in the day---gets things done (then only the 2) Uses heating pad or ice prn  Current Outpatient Medications on File Prior to Visit  Medication Sig Dispense Refill  . busPIRone (BUSPAR) 10 MG tablet Take 1 tablet (10 mg total) by mouth 2 (two) times daily. 180 tablet 0  . cetirizine (ZYRTEC) 10 MG tablet Take 10 mg by mouth daily.    . citalopram (CELEXA) 20 MG tablet Take 1 tablet (20 mg total) by mouth daily. 90 tablet 0  . diclofenac sodium (VOLTAREN) 1 % GEL Apply 2 g topically 4 (four) times daily as needed. To hands 200 g 11  . fluticasone (FLONASE) 50 MCG/ACT nasal spray Place 2 sprays into both nostrils daily.    Marland Kitchen. HYDROcodone-acetaminophen (NORCO) 7.5-325 MG tablet Take 1-2 tablets by mouth 2 (two) times daily as needed. 120 tablet 0  . hydrocortisone 2.5 % cream Apply topically 3 (three) times daily as needed. 28 g 3  . methylphenidate (RITALIN) 5 MG tablet Take 1-2 tablets (5-10 mg total) by mouth 2 (two) times daily. 120 tablet 0  . temazepam (RESTORIL) 15 MG capsule Take 1-2 capsules (15-30 mg total) by mouth at bedtime as needed for sleep. 60 capsule 0  . tretinoin (RETIN-A) 0.01 % gel Apply topically at bedtime. 45 g 0  . triamcinolone cream (KENALOG) 0.1 % apply to affected area twice a day AS NEEDED 30 g 2  . valACYclovir (VALTREX) 1000 MG tablet Take 1,000 mg by mouth 2 (two) times daily as needed.    . VENTOLIN HFA 108 (90 Base) MCG/ACT inhaler  INHALE 2 PUFFS BY MOUTH EVERY 6 HOURS IF NEEDED FOR WHEEZING OR SHORTNESS OF BREATH. USE WITH SPACER 18 g 2   No current facility-administered medications on file prior to visit.     Allergies  Allergen Reactions  . Fluoxetine Hcl     REACTION: unspecified  . Paroxetine     REACTION: unspecified  . Poison Ivy Extract   . Venlafaxine     REACTION: unspecified    Past Medical History:  Diagnosis Date  . Anxiety   . Asthma   . Diverticulitis   . GERD (gastroesophageal reflux disease)   . Social anxiety disorder     Past Surgical History:  Procedure Laterality Date  . BREAST ENHANCEMENT SURGERY  1987  . CESAREAN SECTION      Family History  Problem Relation Age of Onset  . Asthma Mother   . COPD Mother   . Diabetes Maternal Grandmother   . Cancer Neg Hx     Social History   Socioeconomic History  . Marital status: Divorced    Spouse name: Not on file  . Number of children: 2  . Years of education: Not on file  . Highest education level: Not on file  Occupational History  . Occupation: Disabled ---anxiety  Social Needs  . Financial resource strain: Not on file  .  Food insecurity:    Worry: Not on file    Inability: Not on file  . Transportation needs:    Medical: Not on file    Non-medical: Not on file  Tobacco Use  . Smoking status: Never Smoker  . Smokeless tobacco: Never Used  Substance and Sexual Activity  . Alcohol use: Yes  . Drug use: No  . Sexual activity: Not on file  Lifestyle  . Physical activity:    Days per week: Not on file    Minutes per session: Not on file  . Stress: Not on file  Relationships  . Social connections:    Talks on phone: Not on file    Gets together: Not on file    Attends religious service: Not on file    Active member of club or organization: Not on file    Attends meetings of clubs or organizations: Not on file    Relationship status: Not on file  . Intimate partner violence:    Fear of current or ex partner:  Not on file    Emotionally abused: Not on file    Physically abused: Not on file    Forced sexual activity: Not on file  Other Topics Concern  . Not on file  Social History Narrative  . Not on file   Review of Systems Sleeping okay with temazepam Appetite is good Just got a dog--from shelter [facility](Chihuahua mix)---emotional care pet    Objective:   Physical Exam  Psychiatric:  Very slightly pressured speech No depression           Assessment & Plan:

## 2018-05-22 NOTE — Assessment & Plan Note (Signed)
Chronic use of temazepam for anxiety and sleep Other options have not been as helpful

## 2018-05-22 NOTE — Addendum Note (Signed)
Addended by: Eual FinesBRIDGES, Jennye Runquist P on: 05/22/2018 04:56 PM   Modules accepted: Orders

## 2018-05-26 DIAGNOSIS — R69 Illness, unspecified: Secondary | ICD-10-CM | POA: Diagnosis not present

## 2018-06-16 ENCOUNTER — Other Ambulatory Visit: Payer: Self-pay

## 2018-06-16 ENCOUNTER — Other Ambulatory Visit: Payer: Self-pay | Admitting: Internal Medicine

## 2018-06-16 MED ORDER — CITALOPRAM HYDROBROMIDE 20 MG PO TABS: 20.0000 mg | ORAL_TABLET | Freq: Every day | ORAL | 3 refills | Status: DC

## 2018-06-16 MED ORDER — HYDROCODONE-ACETAMINOPHEN 7.5-325 MG PO TABS: 1.0000 | ORAL_TABLET | Freq: Two times a day (BID) | ORAL | 0 refills | Status: DC | PRN

## 2018-06-16 MED ORDER — METHYLPHENIDATE HCL 5 MG PO TABS: 5.0000 mg | ORAL_TABLET | Freq: Two times a day (BID) | ORAL | 0 refills | Status: DC

## 2018-06-16 MED ORDER — BUSPIRONE HCL 10 MG PO TABS: 10.0000 mg | ORAL_TABLET | Freq: Two times a day (BID) | ORAL | 3 refills | Status: DC

## 2018-06-16 MED ORDER — TEMAZEPAM 15 MG PO CAPS: 15.0000 mg | ORAL_CAPSULE | Freq: Every evening | ORAL | 0 refills | Status: DC | PRN

## 2018-06-16 NOTE — Telephone Encounter (Signed)
Name of Medication: Hydrocoodne Name of Pharmacy: [facility] [place] Last Fill or Written Date and Quantity: 04-21-18 #120 Last Office Visit and Type: 3 MO F/U 8-15-1 Next Office Visit and Type: 3 MO F/U 08-25-18 Last Controlled Substance Agreement Date: 11-21-17 Last UDS: 11-21-17  Temazepam last filled 04-21-18 #60

## 2018-06-16 NOTE — Telephone Encounter (Signed)
Last filled 02-21-18 #120  Last OV 05-22-18 Next OV 08-25-18

## 2018-06-17 NOTE — Telephone Encounter (Signed)
I spoke with [facility] pharmacist  at [facility] and advised hydrocodone apap given for chronic lower back pain. [name] voiced understanding and that was all needed.

## 2018-06-17 NOTE — Telephone Encounter (Signed)
[name] from [facility]. States in the state of [place] they need to know if the HYDROcodone-acetaminophen (NORCO) 7.5-325 MG tablet is being RXd for chronic or acute pain.  Phillips Hay can be reached at [phone]

## 2018-07-24 DIAGNOSIS — H10432 Chronic follicular conjunctivitis, left eye: Secondary | ICD-10-CM | POA: Diagnosis not present

## 2018-07-24 DIAGNOSIS — H02055 Trichiasis without entropian left lower eyelid: Secondary | ICD-10-CM | POA: Diagnosis not present

## 2018-07-24 DIAGNOSIS — H1132 Conjunctival hemorrhage, left eye: Secondary | ICD-10-CM | POA: Diagnosis not present

## 2018-07-26 DIAGNOSIS — Z0100 Encounter for examination of eyes and vision without abnormal findings: Secondary | ICD-10-CM | POA: Diagnosis not present

## 2018-08-11 ENCOUNTER — Other Ambulatory Visit: Payer: Self-pay

## 2018-08-11 MED ORDER — METHYLPHENIDATE HCL 5 MG PO TABS: 5.0000 mg | ORAL_TABLET | Freq: Two times a day (BID) | ORAL | 0 refills | Status: DC

## 2018-08-11 MED ORDER — HYDROCODONE-ACETAMINOPHEN 7.5-325 MG PO TABS: 1.0000 | ORAL_TABLET | Freq: Two times a day (BID) | ORAL | 0 refills | Status: DC | PRN

## 2018-08-11 MED ORDER — TEMAZEPAM 15 MG PO CAPS: 15.0000 mg | ORAL_CAPSULE | Freq: Every evening | ORAL | 0 refills | Status: DC | PRN

## 2018-08-11 NOTE — Telephone Encounter (Signed)
Name of Medication: Hydrocodone Name of Pharmacy: [facility], [place] Last Fill or Written Date and Quantity: 06-16-18 #120 Last Office Visit and Type: 05-22-18 3 Month F/U Next Office Visit and Type: 08-25-18 3 Month Follow-up Last Controlled Substance Agreement Date: 11-21-17 Last UDS: 11-21-17  Name of Medication: Methylphenidate Last Fill or Written Date and Quantity: 06-16-18 #120   Name of Medication: Temazepam Last Fill or Written Date and Quantity: 06-16-18 #60

## 2018-08-12 DIAGNOSIS — R69 Illness, unspecified: Secondary | ICD-10-CM | POA: Diagnosis not present

## 2018-08-25 ENCOUNTER — Encounter: Payer: Self-pay | Admitting: Internal Medicine

## 2018-08-25 ENCOUNTER — Ambulatory Visit (INDEPENDENT_AMBULATORY_CARE_PROVIDER_SITE_OTHER): Payer: Medicare HMO | Admitting: Internal Medicine

## 2018-08-25 VITALS — BP 100/64 | HR 68 | Temp 97.9°F | Ht 65.0 in | Wt 119.0 lb

## 2018-08-25 DIAGNOSIS — G8929 Other chronic pain: Secondary | ICD-10-CM

## 2018-08-25 DIAGNOSIS — Z0000 Encounter for general adult medical examination without abnormal findings: Secondary | ICD-10-CM | POA: Diagnosis not present

## 2018-08-25 DIAGNOSIS — R69 Illness, unspecified: Secondary | ICD-10-CM | POA: Diagnosis not present

## 2018-08-25 DIAGNOSIS — F1120 Opioid dependence, uncomplicated: Secondary | ICD-10-CM | POA: Diagnosis not present

## 2018-08-25 DIAGNOSIS — F3342 Major depressive disorder, recurrent, in full remission: Secondary | ICD-10-CM | POA: Diagnosis not present

## 2018-08-25 DIAGNOSIS — F411 Generalized anxiety disorder: Secondary | ICD-10-CM

## 2018-08-25 DIAGNOSIS — Z7189 Other specified counseling: Secondary | ICD-10-CM | POA: Diagnosis not present

## 2018-08-25 DIAGNOSIS — M545 Low back pain: Secondary | ICD-10-CM

## 2018-08-25 NOTE — Assessment & Plan Note (Signed)
Will continue the citalopram and ritalin

## 2018-08-25 NOTE — Progress Notes (Signed)
Subjective:    Patient ID: Rachel Vaughn, female    DOB: 04-25-1961, 57 y.o.   MRN: [id number]  HPI Here for Initial Medicare visit and follow up of chronic health conditions Reviewed form and advanced directives Reviewed other doctors No alcohol or tobacco Trying to walk regularly Hearing and vision are okay No falls Chronic mood problems Will have beer 2-3 times a week Independent wit instrumental ADLs No sig memory problems  Is enjoying living in [place] the warm weather this time of year She has lived down there before--- [place], so [place]zes Wound up buying a house---renting didn't work out  No recent depressed mood Chronic severe anxiety Still finds basic activities like grocery shopping stressful---doing better living near SIL and going together Continues on ritalin/citalopram/benzodiazepines Did see Dr Alycia Rossetti for counseling while here  Chronic back pain Seems about the same More recent sciatic symptoms Usually uses the hydrocodone bid  Plans to get gyn---pap not due till 2021 Will need mammogram--she will let me know where to send order  Current Outpatient Medications on File Prior to Visit  Medication Sig Dispense Refill  . busPIRone (BUSPAR) 10 MG tablet Take 1 tablet (10 mg total) by mouth 2 (two) times daily. 180 tablet 3  . cetirizine (ZYRTEC) 10 MG tablet Take 10 mg by mouth daily.    . citalopram (CELEXA) 20 MG tablet Take 1 tablet (20 mg total) by mouth daily. 90 tablet 3  . diclofenac sodium (VOLTAREN) 1 % GEL Apply 2 g topically 4 (four) times daily as needed. To hands 200 g 11  . fluticasone (FLONASE) 50 MCG/ACT nasal spray Place 2 sprays into both nostrils daily.    Marland Kitchen HYDROcodone-acetaminophen (NORCO) 7.5-325 MG tablet Take 1-2 tablets by mouth 2 (two) times daily as needed. 120 tablet 0  . hydrocortisone 2.5 % cream Apply topically 3 (three) times daily as needed. 28 g 3  . methylphenidate (RITALIN) 5 MG tablet Take  1-2 tablets (5-10 mg total) by mouth 2 (two) times daily. 120 tablet 0  . temazepam (RESTORIL) 15 MG capsule Take 1-2 capsules (15-30 mg total) by mouth at bedtime as needed for sleep. 60 capsule 0  . tretinoin (RETIN-A) 0.01 % gel Apply topically at bedtime. 45 g 0  . triamcinolone cream (KENALOG) 0.1 % apply to affected area twice a day AS NEEDED 30 g 2  . valACYclovir (VALTREX) 1000 MG tablet Take 1,000 mg by mouth 2 (two) times daily as needed.    . VENTOLIN HFA 108 (90 Base) MCG/ACT inhaler INHALE 2 PUFFS BY MOUTH EVERY 6 HOURS IF NEEDED FOR WHEEZING OR SHORTNESS OF BREATH. USE WITH SPACER 18 g 2   No current facility-administered medications on file prior to visit.     Allergies  Allergen Reactions  . Fluoxetine Hcl     REACTION: unspecified  . Paroxetine     REACTION: unspecified  . Poison Ivy Extract   . Venlafaxine     REACTION: unspecified    Past Medical History:  Diagnosis Date  . Anxiety   . Asthma   . Diverticulitis   . GERD (gastroesophageal reflux disease)   . Social anxiety disorder     Past Surgical History:  Procedure Laterality Date  . BREAST ENHANCEMENT SURGERY  1987  . CESAREAN SECTION      Family History  Problem Relation Age of Onset  . Asthma Mother   . COPD Mother   . Diabetes Maternal Grandmother   .  Cancer Neg Hx     Social History   Socioeconomic History  . Marital status: Divorced    Spouse name: Not on file  . Number of children: 2  . Years of education: Not on file  . Highest education level: Not on file  Occupational History  . Occupation: Disabled ---anxiety  Social Needs  . Financial resource strain: Not on file  . Food insecurity:    Worry: Not on file    Inability: Not on file  . Transportation needs:    Medical: Not on file    Non-medical: Not on file  Tobacco Use  . Smoking status: Never Smoker  . Smokeless tobacco: Never Used  Substance and Sexual Activity  . Alcohol use: Yes  . Drug use: No  . Sexual  activity: Not on file  Lifestyle  . Physical activity:    Days per week: Not on file    Minutes per session: Not on file  . Stress: Not on file  Relationships  . Social connections:    Talks on phone: Not on file    Gets together: Not on file    Attends religious service: Not on file    Active member of club or organization: Not on file    Attends meetings of clubs or organizations: Not on file    Relationship status: Not on file  . Intimate partner violence:    Fear of current or ex partner: Not on file    Emotionally abused: Not on file    Physically abused: Not on file    Forced sexual activity: Not on file  Other Topics Concern  . Not on file  Social History Narrative   No living will   Would want children to make decisions   Would accept resuscitation   Not sure about tube feeds    Review of Systems Mild skin issues---will establish with dermatologist Teeth generally okay-- needs to get dentist Appetite is good Weight down slightly Wears seat belt Bowels are still loose in general---uses imodium 1 in AM every 2-3 days. This is helping Voids fine. No incontinence No chest pain Regular palpitations with anxiety No SOB No dizziness or syncope No edema Some pain in left CMC ---uses cream    Objective:   Physical Exam  Constitutional: She is oriented to person, place, and time. She appears well-developed. No distress.  HENT:  Mouth/Throat: Oropharynx is clear and moist. No oropharyngeal exudate.  Neck: No thyromegaly present.  Cardiovascular: Normal rate, regular rhythm, normal heart sounds and intact distal pulses. Exam reveals no gallop.  No murmur heard. Respiratory: Effort normal and breath sounds normal. No respiratory distress. She has no wheezes. She has no rales.  GI: Soft. There is no tenderness.  Musculoskeletal: She exhibits no edema or tenderness.  Lymphadenopathy:    She has no cervical adenopathy.  Neurological: She is alert and oriented to person,  place, and time.  President--- "Benson [place]rump, Obama, Bush" [phone] [id number] Recall 3/3  Skin: No rash noted. No erythema.  Psychiatric: She has a normal mood and affect. Her behavior is normal.           Assessment & Plan:

## 2018-08-25 NOTE — Assessment & Plan Note (Signed)
Ongoing pain  Tries to limit narcotic

## 2018-08-25 NOTE — Assessment & Plan Note (Signed)
See social history 

## 2018-08-25 NOTE — Assessment & Plan Note (Signed)
CSRS reviewed  No problems 

## 2018-08-25 NOTE — Assessment & Plan Note (Signed)
Debilitating and disabling Okay on current meds Good to be living near family in [place] Uses benzos daily

## 2018-08-25 NOTE — Assessment & Plan Note (Signed)
I have personally reviewed the Medicare Annual Wellness questionnaire and have noted 1. The patient's medical and social history 2. Their use of alcohol, tobacco or illicit drugs 3. Their current medications and supplements 4. The patient's functional ability including ADL's, fall risks, home safety risks and hearing or visual             impairment. 5. Diet and physical activities 6. Evidence for depression or mood disorders  The patients weight, height, BMI and visual acuity have been recorded in the chart I have made referrals, counseling and provided education to the patient based review of the above and I have provided the pt with a written personalized care plan for preventive services.  I have provided you with a copy of your personalized plan for preventive services. Please take the time to review along with your updated medication list.  Had flu vaccine Planning to get shingrix Exercising more Pap due 2021 Colon negative 2018--- has a while She will set up mammogram in [place] (I can order prn)

## 2018-08-25 NOTE — Progress Notes (Signed)
Hearing Screening   Method: Audiometry   125Hz  250Hz  500Hz  1000Hz  2000Hz  3000Hz  4000Hz  6000Hz  8000Hz   Right ear:   40 40 20  40    Left ear:   25 20 20  20     Vision Screening Comments: October 2019

## 2018-08-26 LAB — CBC
HCT: 41.7 % (ref 36.0–46.0)
Hemoglobin: 13.9 g/dL (ref 12.0–15.0)
MCHC: 33.4 g/dL (ref 30.0–36.0)
MCV: 97.8 fl (ref 78.0–100.0)
PLATELETS: 223 10*3/uL (ref 150.0–400.0)
RBC: 4.26 Mil/uL (ref 3.87–5.11)
RDW: 13.4 % (ref 11.5–15.5)
WBC: 6.6 10*3/uL (ref 4.0–10.5)

## 2018-08-26 LAB — COMPREHENSIVE METABOLIC PANEL
ALBUMIN: 4.4 g/dL (ref 3.5–5.2)
ALT: 13 U/L (ref 0–35)
AST: 17 U/L (ref 0–37)
Alkaline Phosphatase: 56 U/L (ref 39–117)
BILIRUBIN TOTAL: 0.3 mg/dL (ref 0.2–1.2)
BUN: 23 mg/dL (ref 6–23)
CALCIUM: 9.7 mg/dL (ref 8.4–10.5)
CO2: 27 mEq/L (ref 19–32)
CREATININE: 0.81 mg/dL (ref 0.40–1.20)
Chloride: 103 mEq/L (ref 96–112)
GFR: 77.03 mL/min (ref >60.00)
Glucose, Bld: 82 mg/dL (ref 70–99)
Potassium: 4.2 mEq/L (ref 3.5–5.1)
Sodium: 139 mEq/L (ref 135–145)
Total Protein: 7.3 g/dL (ref 6.0–8.3)

## 2018-08-26 LAB — T4, FREE: Free T4: 0.66 ng/dL (ref 0.60–1.60)

## 2018-09-24 DIAGNOSIS — R69 Illness, unspecified: Secondary | ICD-10-CM | POA: Diagnosis not present

## 2018-09-28 ENCOUNTER — Other Ambulatory Visit: Payer: Self-pay

## 2018-09-29 MED ORDER — HYDROCODONE-ACETAMINOPHEN 7.5-325 MG PO TABS: 1.0000 | ORAL_TABLET | Freq: Two times a day (BID) | ORAL | 0 refills | Status: DC | PRN

## 2018-09-29 MED ORDER — METHYLPHENIDATE HCL 5 MG PO TABS: 5.0000 mg | ORAL_TABLET | Freq: Two times a day (BID) | ORAL | 0 refills | Status: DC

## 2018-09-29 MED ORDER — TEMAZEPAM 15 MG PO CAPS: 15.0000 mg | ORAL_CAPSULE | Freq: Every evening | ORAL | 0 refills | Status: DC | PRN

## 2018-09-29 NOTE — Telephone Encounter (Signed)
Name of Medication: Methylphenidate Last Fill or Written Date and Quantity: 08-25-18 #120  Name of Medication: temazepam #30 Last Fill or Written Date and Quantity: 08-25-18  Name of Medication: Hydrocodone Name of Pharmacy: [facility] [place] Last Fill or Written Date and Quantity: 120 08-25-18 Last Office Visit and Type: 3 Month F/U 08-25-18 Next Office Visit and Type: 3 Month F/U 12-25-18 Last Controlled Substance Agreement Date: 11-21-17 Last UDS: 11-21-17

## 2018-11-24 ENCOUNTER — Other Ambulatory Visit: Payer: Self-pay

## 2018-11-24 MED ORDER — VALACYCLOVIR HCL 1 G PO TABS: 1000.0000 mg | ORAL_TABLET | Freq: Two times a day (BID) | ORAL | 1 refills | Status: DC | PRN

## 2018-11-24 MED ORDER — HYDROCODONE-ACETAMINOPHEN 7.5-325 MG PO TABS: 1.0000 | ORAL_TABLET | Freq: Two times a day (BID) | ORAL | 0 refills | Status: DC | PRN

## 2018-11-24 MED ORDER — METHYLPHENIDATE HCL 5 MG PO TABS: 5.0000 mg | ORAL_TABLET | Freq: Two times a day (BID) | ORAL | 0 refills | Status: DC

## 2018-11-24 MED ORDER — TEMAZEPAM 15 MG PO CAPS: 15.0000 mg | ORAL_CAPSULE | Freq: Every evening | ORAL | 0 refills | Status: DC | PRN

## 2018-11-24 NOTE — Telephone Encounter (Signed)
Name of Medication: Temazepam Last Fill or Written Date and Quantity: 09-29-18 #30  Name of Medication: methylphenidate  Last Fill or Written Date and Quantity: 09-29-18 #120    Name of Medication: Hydrocodone Name of Pharmacy: [facility] Last Fill or Written Date and Quantity: 09-29-18 #120 Last Office Visit and Type: 3 month f/u 08-25-18 Next Office Visit and Type: 3 Month f/u 12-25-18 Last Controlled Substance Agreement Date: 11-21-17 Last UDS: 11-21-17

## 2018-12-22 IMAGING — US US PELVIS COMPLETE
1 series · 14 of 25 positions shown · non-contrast
Comparison: None

CLINICAL DATA: Initial evaluation for postmenopausal bleeding.

EXAM:
TRANSABDOMINAL AND TRANSVAGINAL ULTRASOUND OF PELVIS
TECHNIQUE: Both transabdominal and transvaginal ultrasound examinations of the
pelvis were performed. Transabdominal technique was performed for
global imaging of the pelvis including uterus, ovaries, adnexal
regions, and pelvic cul-de-sac. It was necessary to proceed with
endovaginal exam following the transabdominal exam to visualize the
uterus and ovaries.

[Series 1: us pelvis complete · 0.20mm/px · 73 acquisitions, 14 frames shown]
[im 1/73]
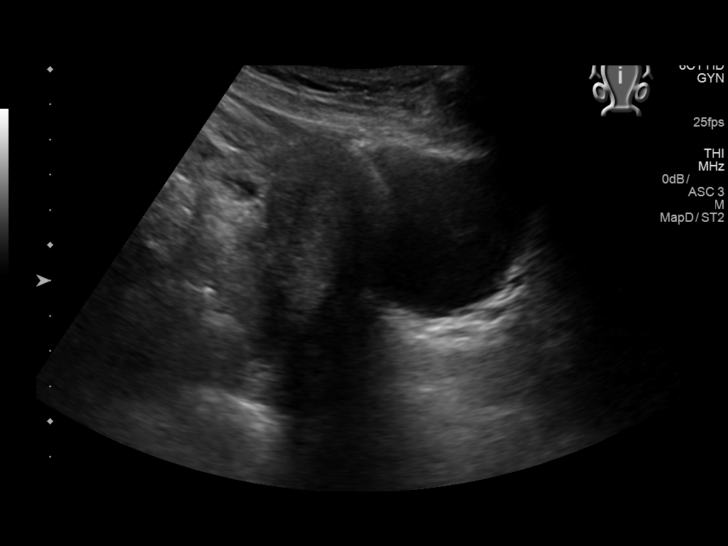
[im 7/73]
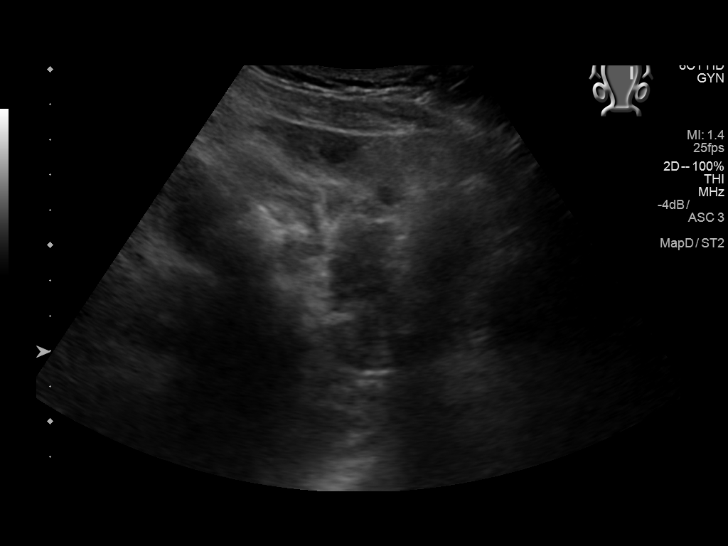
[im 13/73]
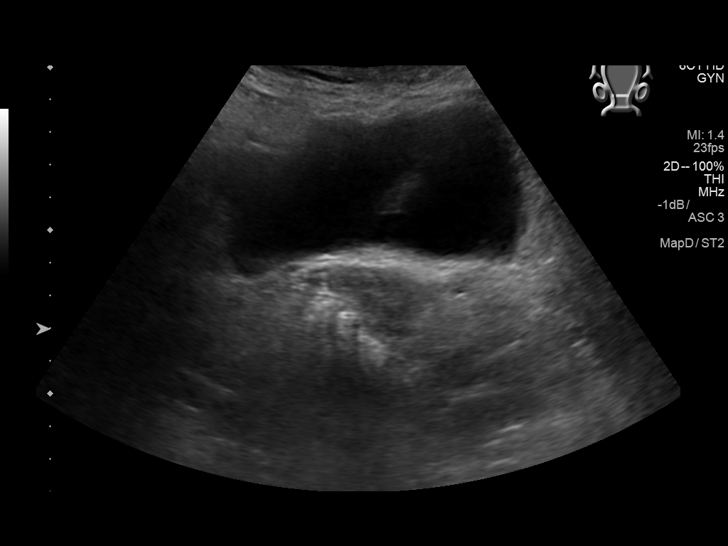
[im 19/73]
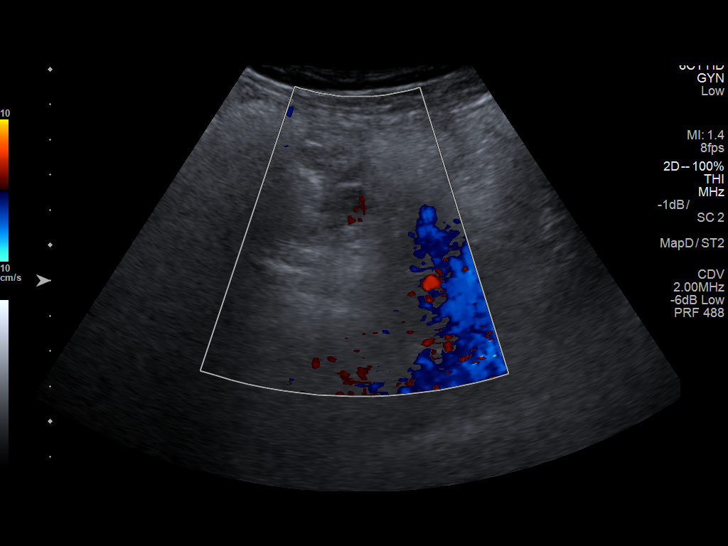
[im 25/73]
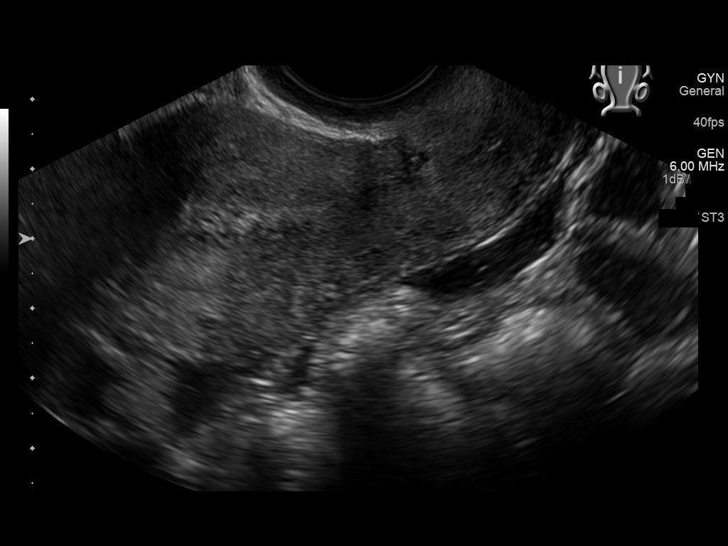
[im 28/73]
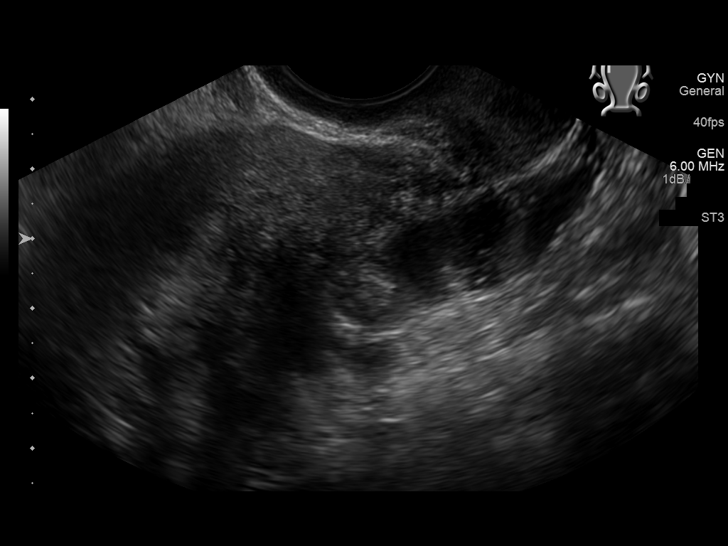
[im 34/73]
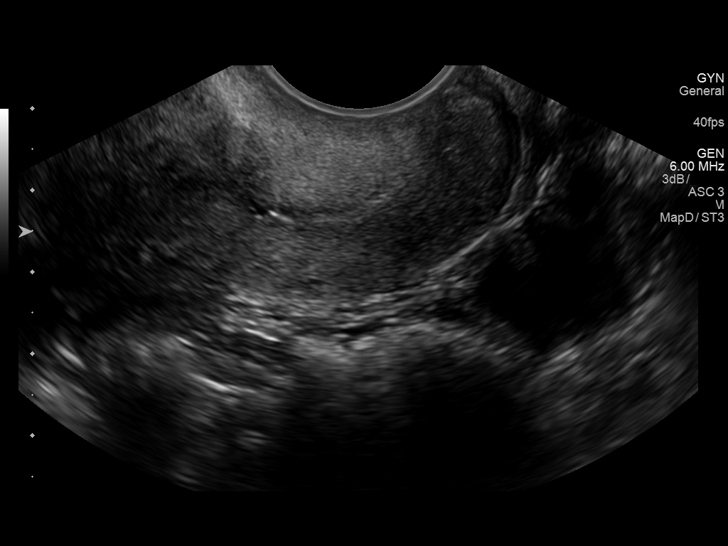
[im 40/73]
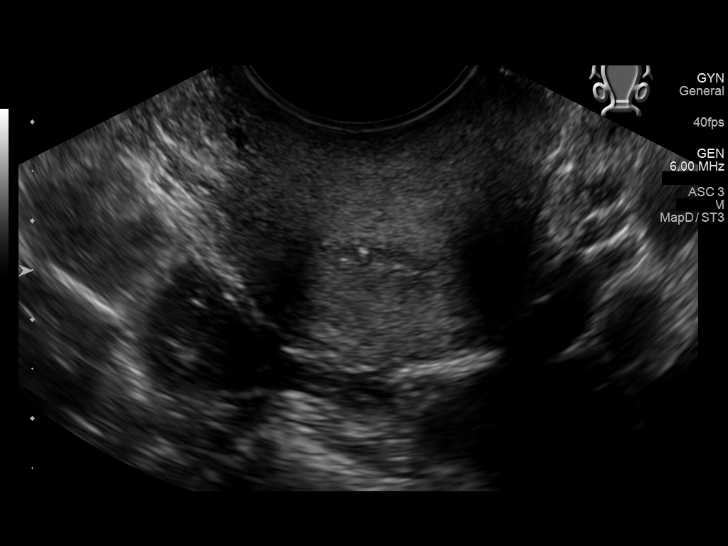
[im 46/73]
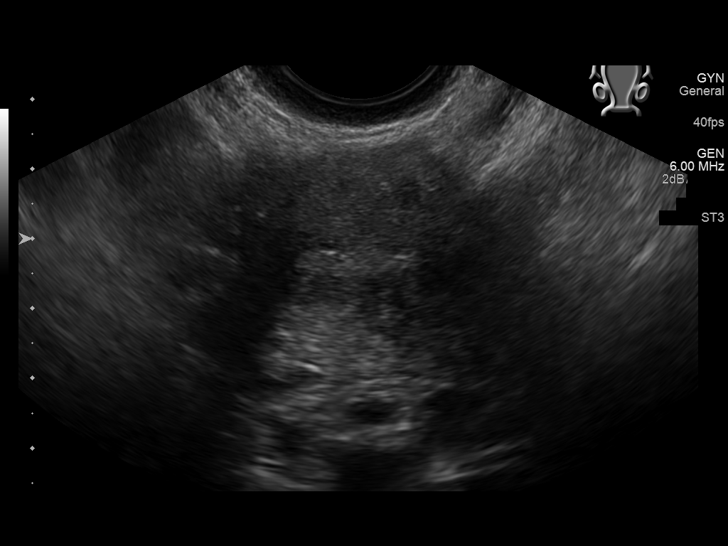
[im 49/73]
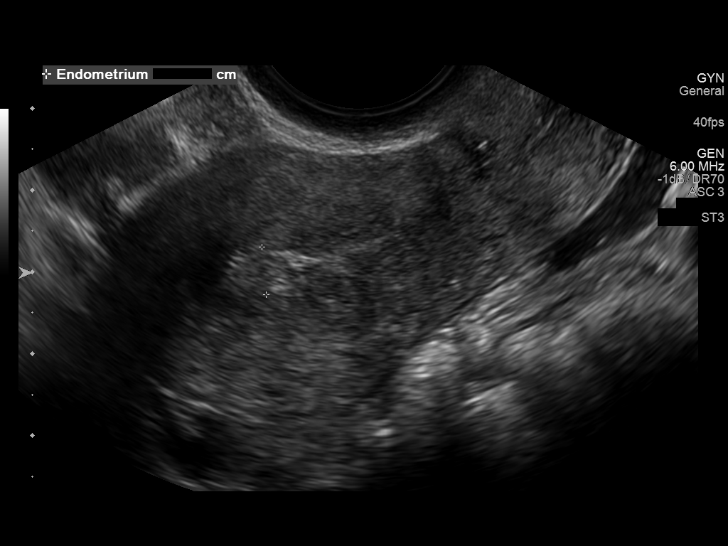
[im 55/73]
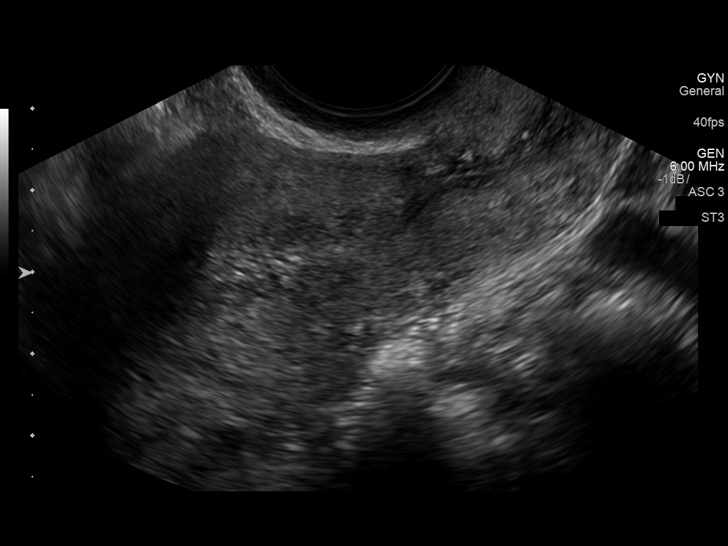
[im 61/73]
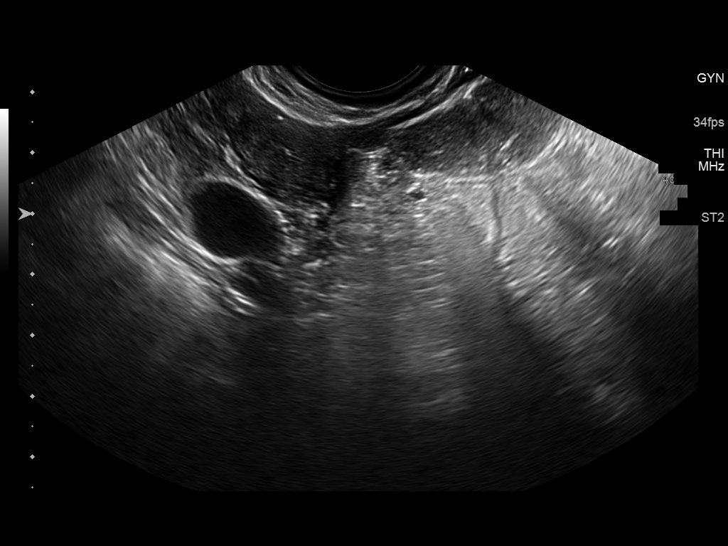
[im 67/73]
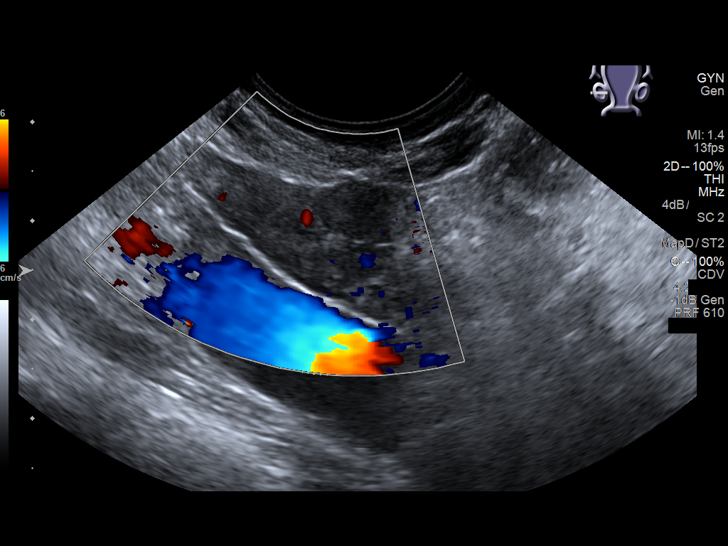
[im 73/73]
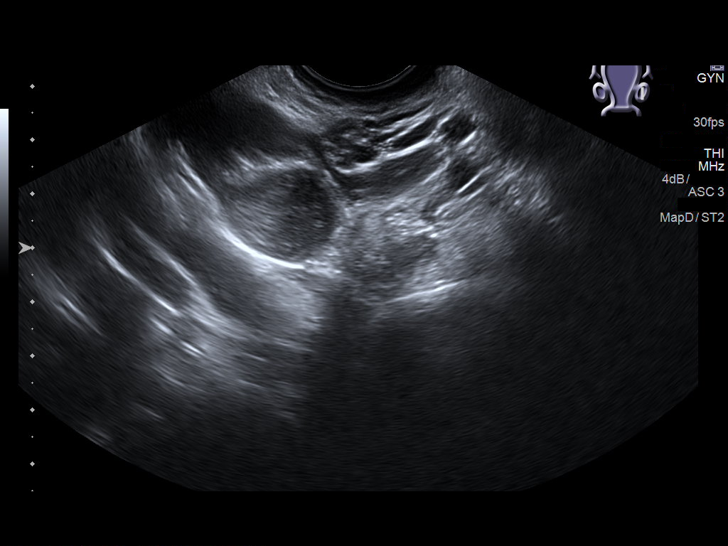

[14 of 25 positions shown; findings below may reference images not displayed]

FINDINGS: Uterus

Measurements: 7.1 x 3.7 x 4.5 cm.. No fibroids or other mass
visualized.

Endometrium

Thickness: 6 mm.  No focal abnormality visualized.

Right ovary

Measurements: 1.9 x 1.5 x 2.2 cm. Normal appearance/no adnexal mass.

Left ovary

Not visualized due to overlying bowel gas.

Other findings

No abnormal free fluid.
IMPRESSION: 1. Mildly thickened endometrium to 6 mm without focal lesion.
Consider further evaluation with sonohysterography. Additionally,
gynecologic consultation suggested.
2. Normal appearance of the right ovary and uterus.
3. Nonvisualization of the left ovary.

## 2018-12-25 ENCOUNTER — Ambulatory Visit: Payer: Medicare HMO | Admitting: Internal Medicine

## 2019-01-13 ENCOUNTER — Other Ambulatory Visit: Payer: Self-pay

## 2019-01-13 NOTE — Telephone Encounter (Signed)
Name of Medication: Hydrocodone Last Fill or Written Date and Quantity: 11-24-18 #120 Last Office Visit and Type: 3 month f/u 08-25-18 Next Office Visit and Type: 6 month f/u 02-12-19 Last Controlled Substance Agreement Date: 11-21-17 Last UDS: 11-21-17  Temazepam last filled 11-24-18 #30 Methylphenidate last filled 11-24-18 #120  [facility] [place]

## 2019-01-14 ENCOUNTER — Telehealth: Payer: Self-pay | Admitting: *Deleted

## 2019-01-14 MED ORDER — METHYLPHENIDATE HCL 5 MG PO TABS: 5.0000 mg | ORAL_TABLET | Freq: Two times a day (BID) | ORAL | 0 refills | Status: DC

## 2019-01-14 MED ORDER — TEMAZEPAM 15 MG PO CAPS: 15.0000 mg | ORAL_CAPSULE | Freq: Every evening | ORAL | 0 refills | Status: DC | PRN

## 2019-01-14 MED ORDER — HYDROCODONE-ACETAMINOPHEN 7.5-325 MG PO TABS: 1.0000 | ORAL_TABLET | Freq: Two times a day (BID) | ORAL | 0 refills | Status: DC | PRN

## 2019-01-14 NOTE — Telephone Encounter (Signed)
Please set up virtual visit in the next week or 2 Cancel the May visit and let her know that we can do them virtually at this point---even after COVID (though I would keep up at least 1 yearly in person PE)

## 2019-01-14 NOTE — Telephone Encounter (Signed)
She is early---find out what is going on and set up virtual visit for tomorrow if she can

## 2019-01-14 NOTE — Telephone Encounter (Signed)
She last filled them 11-24-18  Spoke to pt. Made her a DOXY meeting tomorrow at 2:15

## 2019-01-14 NOTE — Telephone Encounter (Signed)
Pharmacist at [facility] in [place] left a voicemail stating that they received an e-scribe for [employer]. Pharmacist stated that the state of [place] requires that they document  that this is for non-acute pain. Pharmacist requested a call back with a verbal to make this change.

## 2019-01-15 ENCOUNTER — Ambulatory Visit (INDEPENDENT_AMBULATORY_CARE_PROVIDER_SITE_OTHER): Payer: Medicare PPO | Admitting: Internal Medicine

## 2019-01-15 ENCOUNTER — Encounter: Payer: Self-pay | Admitting: Internal Medicine

## 2019-01-15 VITALS — Wt 121.0 lb

## 2019-01-15 DIAGNOSIS — F1120 Opioid dependence, uncomplicated: Secondary | ICD-10-CM | POA: Diagnosis not present

## 2019-01-15 DIAGNOSIS — F1320 Sedative, hypnotic or anxiolytic dependence, uncomplicated: Secondary | ICD-10-CM | POA: Diagnosis not present

## 2019-01-15 DIAGNOSIS — G8929 Other chronic pain: Secondary | ICD-10-CM

## 2019-01-15 DIAGNOSIS — F3342 Major depressive disorder, recurrent, in full remission: Secondary | ICD-10-CM

## 2019-01-15 DIAGNOSIS — M545 Low back pain, unspecified: Secondary | ICD-10-CM

## 2019-01-15 NOTE — Assessment & Plan Note (Signed)
Chronic anxiety Needs the medications on ongoing basis

## 2019-01-15 NOTE — Assessment & Plan Note (Signed)
CSRS reviewed Some limitations since she lives in [place] now

## 2019-01-15 NOTE — Telephone Encounter (Signed)
Left message on VM for the pharmacy with the chronic low back pain dx and the codes.

## 2019-01-15 NOTE — Assessment & Plan Note (Signed)
Has worsened lately Continues on the narcotic--but adding back heat and proper exercise

## 2019-01-15 NOTE — Telephone Encounter (Signed)
okay

## 2019-01-15 NOTE — Assessment & Plan Note (Signed)
Stress with the pandemic, etc----but no real recurrence Will continue meds

## 2019-01-15 NOTE — Progress Notes (Signed)
Subjective:    Patient ID: Rachel Vaughn, female    DOB: February 01, 1961, 58 y.o.   MRN: [id number]  HPI Video visit to review chronic narcotic and benzodiazepine dependence as well as mood and chronic pain Identification done---reviewed billing  She has done okay with the social isolation "I have the feeling that people understand something about my life" SIL works for [employer]-----she has been leaving groceries for her Goes out a little---like to pharmacy Does get out on property---with dog  Her new home is working out okay But did find black mold in some of her house---cost a lot of money to eradicate  Only some sadness---like everyone with the pandemic  Back pain is actually worse Starting with heating pad in the mornings again Doing some yoga, etc  Current Outpatient Medications on File Prior to Visit  Medication Sig Dispense Refill  . busPIRone (BUSPAR) 10 MG tablet Take 1 tablet (10 mg total) by mouth 2 (two) times daily. 180 tablet 3  . cetirizine (ZYRTEC) 10 MG tablet Take 10 mg by mouth daily.    . citalopram (CELEXA) 20 MG tablet Take 1 tablet (20 mg total) by mouth daily. 90 tablet 3  . diclofenac sodium (VOLTAREN) 1 % GEL Apply 2 g topically 4 (four) times daily as needed. To hands 200 g 11  . fluticasone (FLONASE) 50 MCG/ACT nasal spray Place 2 sprays into both nostrils daily.    Marland Kitchen. HYDROcodone-acetaminophen (NORCO) 7.5-325 MG tablet Take 1-2 tablets by mouth 2 (two) times daily as needed. 120 tablet 0  . hydrocortisone 2.5 % cream Apply topically 3 (three) times daily as needed. 28 g 3  . methylphenidate (RITALIN) 5 MG tablet Take 1-2 tablets (5-10 mg total) by mouth 2 (two) times daily. 120 tablet 0  . temazepam (RESTORIL) 15 MG capsule Take 1-2 capsules (15-30 mg total) by mouth at bedtime as needed for sleep. 60 capsule 0  . tretinoin (RETIN-A) 0.01 % gel Apply topically at bedtime. 45 g 0  . triamcinolone cream (KENALOG) 0.1 % apply to affected area twice a day AS  NEEDED 30 g 2  . valACYclovir (VALTREX) 1000 MG tablet Take 1 tablet (1,000 mg total) by mouth 2 (two) times daily as needed. 30 tablet 1  . VENTOLIN HFA 108 (90 Base) MCG/ACT inhaler INHALE 2 PUFFS BY MOUTH EVERY 6 HOURS IF NEEDED FOR WHEEZING OR SHORTNESS OF BREATH. USE WITH SPACER 18 g 2   No current facility-administered medications on file prior to visit.     Allergies  Allergen Reactions  . Fluoxetine Hcl     REACTION: unspecified  . Paroxetine     REACTION: unspecified  . Poison Ivy Extract   . Venlafaxine     REACTION: unspecified    Past Medical History:  Diagnosis Date  . Anxiety   . Asthma   . Diverticulitis   . GERD (gastroesophageal reflux disease)   . Social anxiety disorder     Past Surgical History:  Procedure Laterality Date  . BREAST ENHANCEMENT SURGERY  1987  . CESAREAN SECTION      Family History  Problem Relation Age of Onset  . Asthma Mother   . COPD Mother   . Diabetes Maternal Grandmother   . Cancer Neg Hx     Social History   Socioeconomic History  . Marital status: Divorced    Spouse name: Not on file  . Number of children: 2  . Years of education: Not on file  . Highest  education level: Not on file  Occupational History  . Occupation: Disabled ---anxiety  Social Needs  . Financial resource strain: Not on file  . Food insecurity:    Worry: Not on file    Inability: Not on file  . Transportation needs:    Medical: Not on file    Non-medical: Not on file  Tobacco Use  . Smoking status: Never Smoker  . Smokeless tobacco: Never Used  Substance and Sexual Activity  . Alcohol use: Yes  . Drug use: No  . Sexual activity: Not on file  Lifestyle  . Physical activity:    Days per week: Not on file    Minutes per session: Not on file  . Stress: Not on file  Relationships  . Social connections:    Talks on phone: Not on file    Gets together: Not on file    Attends religious service: Not on file    Active member of club or  organization: Not on file    Attends meetings of clubs or organizations: Not on file    Relationship status: Not on file  . Intimate partner violence:    Fear of current or ex partner: Not on file    Emotionally abused: Not on file    Physically abused: Not on file    Forced sexual activity: Not on file  Other Topics Concern  . 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 No distress.  Psychiatric:  Mood is neutral and appropriate affect, dress and speech           Assessment & Plan:

## 2019-02-12 ENCOUNTER — Ambulatory Visit: Payer: Medicare HMO | Admitting: Internal Medicine

## 2019-03-03 ENCOUNTER — Other Ambulatory Visit: Payer: Self-pay

## 2019-03-03 MED ORDER — HYDROCODONE-ACETAMINOPHEN 7.5-325 MG PO TABS: 1.0000 | ORAL_TABLET | Freq: Two times a day (BID) | ORAL | 0 refills | Status: DC | PRN

## 2019-03-03 MED ORDER — TEMAZEPAM 15 MG PO CAPS: 15.0000 mg | ORAL_CAPSULE | Freq: Every evening | ORAL | 0 refills | Status: DC | PRN

## 2019-03-03 MED ORDER — METHYLPHENIDATE HCL 5 MG PO TABS: 5.0000 mg | ORAL_TABLET | Freq: Two times a day (BID) | ORAL | 0 refills | Status: DC

## 2019-03-03 NOTE — Telephone Encounter (Signed)
Name of Medication: Hydrocodone Last Fill or Written Date and Quantity: 01-14-19 #120 Last Office Visit and Type: 3 month f/u 01-15-19 Next Office Visit and Type: 03-04-19 Last Controlled Substance Agreement Date: 11-21-17 Last UDS: 11-21-17  Temazepam last filled 01-14-19 #30 Methylphenidate last filled 01-14-19 #120  [facility] [place]

## 2019-03-04 ENCOUNTER — Encounter: Payer: Self-pay | Admitting: Internal Medicine

## 2019-03-04 ENCOUNTER — Ambulatory Visit (INDEPENDENT_AMBULATORY_CARE_PROVIDER_SITE_OTHER): Payer: Medicare PPO | Admitting: Internal Medicine

## 2019-03-04 VITALS — Wt 125.0 lb

## 2019-03-04 DIAGNOSIS — K2100 Gastro-esophageal reflux disease with esophagitis, without bleeding: Secondary | ICD-10-CM

## 2019-03-04 NOTE — Progress Notes (Signed)
Subjective:    Patient ID: Rachel Vaughn, female    DOB: Jan 23, 1961, 58 y.o.   MRN: [id number]  HPI Virtual visit for ER follow up Identification done Reviewed billing and she gave consent She is in her home in [place]--I am in my office  Has had 2 prior episodes in the past month "Really bad chest pain in the middle of the night" She was concerned about a heart attack Got EKG and enzymes as well as CXR Nothing worrisome except some scarring on CXR  Pain is right in the middle of the chest--"like I have a sore in there" Will occasionally radiate to back Voice gets raspy--Am and then worse as the day goes on No N/V Gets constant pressure---"like something was bruised in there" No hematemesis No dysphagia  Started omeprazole OTC Not clear it is helping Is on empty stomach Was set up for GI appointment  Anxiety has also been "off the charts" Using an extra temazepam prn in the afternoon Feels the anxiety is adding to this  Current Outpatient Medications on File Prior to Visit  Medication Sig Dispense Refill  . busPIRone (BUSPAR) 10 MG tablet Take 1 tablet (10 mg total) by mouth 2 (two) times daily. 180 tablet 3  . cetirizine (ZYRTEC) 10 MG tablet Take 10 mg by mouth daily.    . citalopram (CELEXA) 20 MG tablet Take 1 tablet (20 mg total) by mouth daily. 90 tablet 3  . diclofenac sodium (VOLTAREN) 1 % GEL Apply 2 g topically 4 (four) times daily as needed. To hands 200 g 11  . fluticasone (FLONASE) 50 MCG/ACT nasal spray Place 2 sprays into both nostrils daily.    Marland Kitchen HYDROcodone-acetaminophen (NORCO) 7.5-325 MG tablet Take 1-2 tablets by mouth 2 (two) times daily as needed. 120 tablet 0  . hydrocortisone 2.5 % cream Apply topically 3 (three) times daily as needed. 28 g 3  . methylphenidate (RITALIN) 5 MG tablet Take 1-2 tablets (5-10 mg total) by mouth 2 (two) times daily. 120 tablet 0  . omeprazole (PRILOSEC) 20 MG capsule Take 20 mg by mouth daily.    . temazepam  (RESTORIL) 15 MG capsule Take 1-2 capsules (15-30 mg total) by mouth at bedtime as needed for sleep. 60 capsule 0  . tretinoin (RETIN-A) 0.01 % gel Apply topically at bedtime. 45 g 0  . triamcinolone cream (KENALOG) 0.1 % apply to affected area twice a day AS NEEDED 30 g 2  . valACYclovir (VALTREX) 1000 MG tablet Take 1 tablet (1,000 mg total) by mouth 2 (two) times daily as needed. 30 tablet 1  . VENTOLIN HFA 108 (90 Base) MCG/ACT inhaler INHALE 2 PUFFS BY MOUTH EVERY 6 HOURS IF NEEDED FOR WHEEZING OR SHORTNESS OF BREATH. USE WITH SPACER 18 g 2   No current facility-administered medications on file prior to visit.     Allergies  Allergen Reactions  . Fluoxetine Hcl     REACTION: unspecified  . Paroxetine     REACTION: unspecified  . Poison Ivy Extract   . Venlafaxine     REACTION: unspecified    Past Medical History:  Diagnosis Date  . Anxiety   . Asthma   . Diverticulitis   . GERD (gastroesophageal reflux disease)   . Social anxiety disorder     Past Surgical History:  Procedure Laterality Date  . BREAST ENHANCEMENT SURGERY  1987  . CESAREAN SECTION      Family History  Problem Relation Age of Onset  .  Asthma Mother   . COPD Mother   . Diabetes Maternal Grandmother   . Cancer Neg Hx     Social History   Socioeconomic History  . Marital status: Divorced    Spouse name: Not on file  . Number of children: 2  . Years of education: Not on file  . Highest education level: Not on file  Occupational History  . Occupation: Disabled ---anxiety  Social Needs  . Financial resource strain: Not on file  . Food insecurity:    Worry: Not on file    Inability: Not on file  . Transportation needs:    Medical: Not on file    Non-medical: Not on file  Tobacco Use  . Smoking status: Never Smoker  . Smokeless tobacco: Never Used  Substance and Sexual Activity  . Alcohol use: Yes  . Drug use: No  . Sexual activity: Not on file  Lifestyle  . Physical activity:    Days  per week: Not on file    Minutes per session: Not on file  . Stress: Not on file  Relationships  . Social connections:    Talks on phone: Not on file    Gets together: Not on file    Attends religious service: Not on file    Active member of club or organization: Not on file    Attends meetings of clubs or organizations: Not on file    Relationship status: Not on file  . Intimate partner violence:    Fear of current or ex partner: Not on file    Emotionally abused: Not on file    Physically abused: Not on file    Forced sexual activity: Not on file  Other Topics Concern  . Not on file  Social History Narrative   No living will   Would want children to make decisions   Would accept resuscitation   Not sure about tube feeds    Review of Systems Appetite is very good Has gained a few pounds Sister does her shopping---now cutting back on meat/fats Large cup of coffee in AM--now deceasing the size Has cut out sweet tea--now  Bowels are the same---no blood or black stools    Objective:   Physical Exam  Constitutional: She appears well-developed. No distress.  Respiratory: Effort normal. No respiratory distress.  Psychiatric:  Mild anxiety but nothing out of the ordinary           Assessment & Plan:

## 2019-03-04 NOTE — Assessment & Plan Note (Signed)
She gives a classic story for GERD with esophagitis The ER doctor was not at all concerned this was CAD---and the story is compelling for acid damage I recommended she take the omeprazole bid for 2 weeks----if she still has symptoms, she should proceed with the GI appointment. If the symptoms are better, she can reduce to daily and doesn't need the GI appt

## 2019-03-05 MED ORDER — OMEPRAZOLE 20 MG PO CPDR: 20.0000 mg | DELAYED_RELEASE_CAPSULE | Freq: Two times a day (BID) | ORAL | 11 refills | Status: DC

## 2019-04-03 ENCOUNTER — Other Ambulatory Visit: Payer: Self-pay

## 2019-04-03 MED ORDER — TEMAZEPAM 15 MG PO CAPS: 15.0000 mg | ORAL_CAPSULE | Freq: Every evening | ORAL | 0 refills | Status: DC | PRN

## 2019-04-03 NOTE — Telephone Encounter (Signed)
Last filled 03-03-19 #60 Last OV 03-04-19 Next OV 04-23-19 [facility]

## 2019-04-23 ENCOUNTER — Ambulatory Visit (INDEPENDENT_AMBULATORY_CARE_PROVIDER_SITE_OTHER): Payer: Medicare PPO | Admitting: Internal Medicine

## 2019-04-23 ENCOUNTER — Encounter: Payer: Self-pay | Admitting: Internal Medicine

## 2019-04-23 DIAGNOSIS — K2100 Gastro-esophageal reflux disease with esophagitis, without bleeding: Secondary | ICD-10-CM

## 2019-04-23 DIAGNOSIS — G8929 Other chronic pain: Secondary | ICD-10-CM

## 2019-04-23 DIAGNOSIS — F1120 Opioid dependence, uncomplicated: Secondary | ICD-10-CM

## 2019-04-23 DIAGNOSIS — M5440 Lumbago with sciatica, unspecified side: Secondary | ICD-10-CM

## 2019-04-23 NOTE — Assessment & Plan Note (Signed)
Clearly was the cause of the chest pain Has needed to stay on the omeprazole bid for now

## 2019-04-23 NOTE — Assessment & Plan Note (Signed)
PDMP reviewed No concerns 

## 2019-04-23 NOTE — Assessment & Plan Note (Signed)
Fairly stable Does okay with hydrocodone Tries to stay active as well

## 2019-04-23 NOTE — Progress Notes (Signed)
Subjective:    Patient ID: Rachel Vaughn, female    DOB: 03-07-1961, 58 y.o.   MRN: [id number]  HPI Virtual visit for review of chronic pain and narcotic dependence Identification done Reviewed billing and she gave consent She is in her home in [place] I am in my office  She has only had one recurrence of the chest symptoms Took the omeprazole bid for 2 weeks--and no symptoms When she went down to daily---she recurred She is back on bid No dysphagia Has bad smell after the pain episodes--now gone again  Had a bad day yesterday Gave blood the day before--then went out yesterday to do yard work [employer] out in the heat and felt bad Back to normal now  She is being careful when going out to the store Stresses her out when others don't have masks, etc Uses extra temazepam at times due to the stress Got COVID test in anticipation of family meeting in [place] (isolating in cabin and they are all getting tested)  Variable back pain--good days and bad days Still using the hydrocodone  Current Outpatient Medications on File Prior to Visit  Medication Sig Dispense Refill  . busPIRone (BUSPAR) 10 MG tablet Take 1 tablet (10 mg total) by mouth 2 (two) times daily. 180 tablet 3  . cetirizine (ZYRTEC) 10 MG tablet Take 10 mg by mouth daily.    . citalopram (CELEXA) 20 MG tablet Take 1 tablet (20 mg total) by mouth daily. 90 tablet 3  . diclofenac sodium (VOLTAREN) 1 % GEL Apply 2 g topically 4 (four) times daily as needed. To hands 200 g 11  . fluticasone (FLONASE) 50 MCG/ACT nasal spray Place 2 sprays into both nostrils daily.    Marland Kitchen HYDROcodone-acetaminophen (NORCO) 7.5-325 MG tablet Take 1-2 tablets by mouth 2 (two) times daily as needed. 120 tablet 0  . hydrocortisone 2.5 % cream Apply topically 3 (three) times daily as needed. 28 g 3  . methylphenidate (RITALIN) 5 MG tablet Take 1-2 tablets (5-10 mg total) by mouth 2 (two) times daily. 120 tablet 0  . omeprazole (PRILOSEC) 20 MG  capsule Take 1 capsule (20 mg total) by mouth 2 (two) times daily before a meal. 60 capsule 11  . temazepam (RESTORIL) 15 MG capsule Take 1-2 capsules (15-30 mg total) by mouth at bedtime as needed for sleep. 60 capsule 0  . tretinoin (RETIN-A) 0.01 % gel Apply topically at bedtime. 45 g 0  . triamcinolone cream (KENALOG) 0.1 % apply to affected area twice a day AS NEEDED 30 g 2  . valACYclovir (VALTREX) 1000 MG tablet Take 1 tablet (1,000 mg total) by mouth 2 (two) times daily as needed. 30 tablet 1  . VENTOLIN HFA 108 (90 Base) MCG/ACT inhaler INHALE 2 PUFFS BY MOUTH EVERY 6 HOURS IF NEEDED FOR WHEEZING OR SHORTNESS OF BREATH. USE WITH SPACER 18 g 2   No current facility-administered medications on file prior to visit.     Allergies  Allergen Reactions  . Fluoxetine Hcl     REACTION: unspecified  . Paroxetine     REACTION: unspecified  . Poison Ivy Extract   . Venlafaxine     REACTION: unspecified    Past Medical History:  Diagnosis Date  . Anxiety   . Asthma   . Diverticulitis   . GERD (gastroesophageal reflux disease)   . Social anxiety disorder     Past Surgical History:  Procedure Laterality Date  . BREAST ENHANCEMENT SURGERY  1987  .  CESAREAN SECTION      Family History  Problem Relation Age of Onset  . Asthma Mother   . COPD Mother   . Diabetes Maternal Grandmother   . Cancer Neg Hx     Social History   Socioeconomic History  . Marital status: Divorced    Spouse name: Not on file  . Number of children: 2  . Years of education: Not on file  . Highest education level: Not on file  Occupational History  . Occupation: Disabled ---anxiety  Social Needs  . Financial resource strain: Not on file  . Food insecurity    Worry: Not on file    Inability: Not on file  . Transportation needs    Medical: Not on file    Non-medical: Not on file  Tobacco Use  . Smoking status: Never Smoker  . Smokeless tobacco: Never Used  Substance and Sexual Activity  .  Alcohol use: Yes  . Drug use: No  . Sexual activity: Not on file  Lifestyle  . Physical activity    Days per week: Not on file    Minutes per session: Not on file  . Stress: Not on file  Relationships  . Social Musicianconnections    Talks on phone: Not on file    Gets together: Not on file    Attends religious service: Not on file    Active member of club or organization: Not on file    Attends meetings of clubs or organizations: Not on file    Relationship status: Not on file  . Intimate partner violence    Fear of current or ex partner: Not on file    Emotionally abused: Not on file    Physically abused: Not on file    Forced sexual activity: Not on file  Other Topics Concern  . Not on file  Social History Narrative   No living will   Would want children to make decisions   Would accept resuscitation   Not sure about tube feeds    Review of Systems Sleeping fine Appetite is good Still does video visits with Dr Alycia Rossettiyan her psychologist    Objective:   Physical Exam         Assessment & Plan:

## 2019-04-27 ENCOUNTER — Other Ambulatory Visit: Payer: Self-pay

## 2019-04-27 MED ORDER — TEMAZEPAM 15 MG PO CAPS: 15.0000 mg | ORAL_CAPSULE | Freq: Every evening | ORAL | 0 refills | Status: DC | PRN

## 2019-04-27 MED ORDER — HYDROCODONE-ACETAMINOPHEN 7.5-325 MG PO TABS: 1.0000 | ORAL_TABLET | Freq: Two times a day (BID) | ORAL | 0 refills | Status: DC | PRN

## 2019-04-27 MED ORDER — METHYLPHENIDATE HCL 5 MG PO TABS: 5.0000 mg | ORAL_TABLET | Freq: Two times a day (BID) | ORAL | 0 refills | Status: DC

## 2019-04-27 NOTE — Telephone Encounter (Signed)
Name of Medication: Hydrocodone Name of Pharmacy: [facility] [place] Last Fill or Written Date and Quantity: 03-03-19 #120 Last Office Visit and Type: 04-23-19 Next Office Visit and Type: No Future OV Last Controlled Substance Agreement Date:  11-21-17 Last UDS: 11-22-07  Temazepam 15mg  #30 Last filled 04-03-19   Methylphenidate: Pt takes 5mg . Asking if Dr Silvio Pate will prescribe the 10mg  and take half tablet because insurance does not cover it.

## 2019-04-30 MED ORDER — METHYLPHENIDATE HCL 10 MG PO TABS: 10.0000 mg | ORAL_TABLET | Freq: Two times a day (BID) | ORAL | 0 refills | Status: DC

## 2019-05-28 DIAGNOSIS — F4001 Agoraphobia with panic disorder: Secondary | ICD-10-CM | POA: Diagnosis not present

## 2019-06-04 ENCOUNTER — Other Ambulatory Visit: Payer: Self-pay

## 2019-06-04 DIAGNOSIS — F4001 Agoraphobia with panic disorder: Secondary | ICD-10-CM | POA: Diagnosis not present

## 2019-06-04 MED ORDER — TEMAZEPAM 15 MG PO CAPS: 15.0000 mg | ORAL_CAPSULE | Freq: Every evening | ORAL | 0 refills | Status: DC | PRN

## 2019-06-04 MED ORDER — HYDROCODONE-ACETAMINOPHEN 7.5-325 MG PO TABS: 1.0000 | ORAL_TABLET | Freq: Two times a day (BID) | ORAL | 0 refills | Status: DC | PRN

## 2019-06-04 NOTE — Telephone Encounter (Signed)
Name of Medication: Hydrocodone Name of Pharmacy: [facility] [place] Last Fill or Written Date and Quantity: 04-27-19 #120 Last Office Visit and Type: 04-23-19 Virtual Next Office Visit and Type: 09-07-19 Last Controlled Substance Agreement Date:  11-21-17 Last UDS: 11-22-07  Temazepam 15mg  #30 Last filled 04-23-19

## 2019-06-15 DIAGNOSIS — F4001 Agoraphobia with panic disorder: Secondary | ICD-10-CM | POA: Diagnosis not present

## 2019-06-29 DIAGNOSIS — F4001 Agoraphobia with panic disorder: Secondary | ICD-10-CM | POA: Diagnosis not present

## 2019-07-02 ENCOUNTER — Other Ambulatory Visit: Payer: Self-pay

## 2019-07-02 ENCOUNTER — Other Ambulatory Visit: Payer: Self-pay | Admitting: Internal Medicine

## 2019-07-03 MED ORDER — TEMAZEPAM 15 MG PO CAPS: 15.0000 mg | ORAL_CAPSULE | Freq: Every evening | ORAL | 0 refills | Status: DC | PRN

## 2019-07-03 MED ORDER — METHYLPHENIDATE HCL 10 MG PO TABS: 10.0000 mg | ORAL_TABLET | Freq: Two times a day (BID) | ORAL | 0 refills | Status: DC

## 2019-07-03 NOTE — Telephone Encounter (Signed)
Methylphenidate last filled 04-30-19 #60 Temazepam last filled 06-04-19 #60 Last OV 04-23-19 Next OV 08-12-19 [facility] [place]

## 2019-07-07 ENCOUNTER — Telehealth: Payer: Self-pay | Admitting: Internal Medicine

## 2019-07-07 DIAGNOSIS — F1320 Sedative, hypnotic or anxiolytic dependence, uncomplicated: Secondary | ICD-10-CM

## 2019-07-07 DIAGNOSIS — F1120 Opioid dependence, uncomplicated: Secondary | ICD-10-CM

## 2019-07-07 NOTE — Telephone Encounter (Signed)
I spoke to patient.  Patient said she'll call back to schedule lab work.

## 2019-07-07 NOTE — Telephone Encounter (Signed)
Okay to set her up for labs in November. Let her know that she will need a urine sample as well

## 2019-07-07 NOTE — Telephone Encounter (Signed)
Patient scheduled virtual cpx on 09/08/19.  Patient will be in town  08/12/19-08/14/19. Patient wants to know if she can get her labs done for her cpx while she's in town or if Dr.Letvak wants to send her an order to have the labs done after cpx in [place].

## 2019-07-13 DIAGNOSIS — F4001 Agoraphobia with panic disorder: Secondary | ICD-10-CM | POA: Diagnosis not present

## 2019-07-20 DIAGNOSIS — F4001 Agoraphobia with panic disorder: Secondary | ICD-10-CM | POA: Diagnosis not present

## 2019-07-24 ENCOUNTER — Other Ambulatory Visit: Payer: Self-pay

## 2019-07-24 MED ORDER — HYDROCODONE-ACETAMINOPHEN 7.5-325 MG PO TABS: 1.0000 | ORAL_TABLET | Freq: Two times a day (BID) | ORAL | 0 refills | Status: DC | PRN

## 2019-07-24 NOTE — Telephone Encounter (Signed)
Marie pharmacist at [facility] in [place] needing a dx to go with refill for hydrocodone apap 7.5-325 mg. Advised chronic low back pain. Lelan Pons said that was all she needed.

## 2019-07-24 NOTE — Telephone Encounter (Signed)
Last office visit 04/23/2019 for 3 month narcotic follow up.  Last refilled 06/04/2019 for #120 with no refills.  UDS/Contract 11/21/2017.  Next Appt: 08/12/2019 for anxiety.

## 2019-07-27 DIAGNOSIS — F4001 Agoraphobia with panic disorder: Secondary | ICD-10-CM | POA: Diagnosis not present

## 2019-08-03 DIAGNOSIS — F4001 Agoraphobia with panic disorder: Secondary | ICD-10-CM | POA: Diagnosis not present

## 2019-08-05 ENCOUNTER — Other Ambulatory Visit: Payer: Self-pay

## 2019-08-05 NOTE — Telephone Encounter (Signed)
Last filled 07-03-19 #60 Last OV 04-23-19 Next OV 08-12-19 [facility] [place]

## 2019-08-06 MED ORDER — TEMAZEPAM 15 MG PO CAPS: 15.0000 mg | ORAL_CAPSULE | Freq: Every evening | ORAL | 0 refills | Status: DC | PRN

## 2019-08-10 DIAGNOSIS — F4001 Agoraphobia with panic disorder: Secondary | ICD-10-CM | POA: Diagnosis not present

## 2019-08-12 ENCOUNTER — Encounter: Payer: Self-pay | Admitting: Internal Medicine

## 2019-08-12 ENCOUNTER — Ambulatory Visit: Payer: Medicare PPO | Admitting: Internal Medicine

## 2019-08-12 ENCOUNTER — Encounter: Payer: Medicare PPO | Admitting: Internal Medicine

## 2019-08-18 DIAGNOSIS — F4001 Agoraphobia with panic disorder: Secondary | ICD-10-CM | POA: Diagnosis not present

## 2019-08-21 ENCOUNTER — Other Ambulatory Visit: Payer: Self-pay

## 2019-08-21 MED ORDER — METHYLPHENIDATE HCL 10 MG PO TABS: 10.0000 mg | ORAL_TABLET | Freq: Two times a day (BID) | ORAL | 0 refills | Status: DC

## 2019-08-21 NOTE — Telephone Encounter (Signed)
LAst filled 07-03-19 #60 Last OV 04-23-19 Next OV 09-08-19 [facility] [place]

## 2019-08-24 DIAGNOSIS — F4001 Agoraphobia with panic disorder: Secondary | ICD-10-CM | POA: Diagnosis not present

## 2019-09-01 DIAGNOSIS — F4001 Agoraphobia with panic disorder: Secondary | ICD-10-CM | POA: Diagnosis not present

## 2019-09-07 ENCOUNTER — Other Ambulatory Visit: Payer: Self-pay | Admitting: Internal Medicine

## 2019-09-07 ENCOUNTER — Encounter: Payer: Medicare PPO | Admitting: Internal Medicine

## 2019-09-07 ENCOUNTER — Other Ambulatory Visit: Payer: Self-pay

## 2019-09-07 MED ORDER — TEMAZEPAM 15 MG PO CAPS: 15.0000 mg | ORAL_CAPSULE | Freq: Every evening | ORAL | 0 refills | Status: DC | PRN

## 2019-09-07 MED ORDER — HYDROCODONE-ACETAMINOPHEN 7.5-325 MG PO TABS: 1.0000 | ORAL_TABLET | Freq: Two times a day (BID) | ORAL | 0 refills | Status: DC | PRN

## 2019-09-07 NOTE — Telephone Encounter (Signed)
Last filled 08-05-19 #60 Last OV 04-23-19 Next OV 09-08-19 [facility] [place]

## 2019-09-07 NOTE — Telephone Encounter (Signed)
Name of Medication: Hydrocodone Name of Pharmacy: [facility] or Written Date and Quantity: 07-24-19 #120 Last Office Visit and Type: 04-23-19 Next Office Visit and Type: 09-08-19 Last Controlled Substance Agreement Date: 11-21-17 Last UDS: 11-21-17

## 2019-09-08 ENCOUNTER — Encounter: Payer: Self-pay | Admitting: Internal Medicine

## 2019-09-08 ENCOUNTER — Ambulatory Visit (INDEPENDENT_AMBULATORY_CARE_PROVIDER_SITE_OTHER): Payer: Medicare PPO | Admitting: Internal Medicine

## 2019-09-08 ENCOUNTER — Other Ambulatory Visit: Payer: Self-pay

## 2019-09-08 DIAGNOSIS — F3341 Major depressive disorder, recurrent, in partial remission: Secondary | ICD-10-CM

## 2019-09-08 DIAGNOSIS — G8929 Other chronic pain: Secondary | ICD-10-CM

## 2019-09-08 DIAGNOSIS — Z7189 Other specified counseling: Secondary | ICD-10-CM

## 2019-09-08 DIAGNOSIS — Z0000 Encounter for general adult medical examination without abnormal findings: Secondary | ICD-10-CM | POA: Diagnosis not present

## 2019-09-08 DIAGNOSIS — M545 Low back pain, unspecified: Secondary | ICD-10-CM

## 2019-09-08 DIAGNOSIS — F1120 Opioid dependence, uncomplicated: Secondary | ICD-10-CM | POA: Diagnosis not present

## 2019-09-08 DIAGNOSIS — K219 Gastro-esophageal reflux disease without esophagitis: Secondary | ICD-10-CM | POA: Diagnosis not present

## 2019-09-08 DIAGNOSIS — F1320 Sedative, hypnotic or anxiolytic dependence, uncomplicated: Secondary | ICD-10-CM

## 2019-09-08 NOTE — Assessment & Plan Note (Signed)
Symptoms controlled with twice a day PPI Will check labs when she can get into the office

## 2019-09-08 NOTE — Assessment & Plan Note (Signed)
PDMP reviewed No concerns 

## 2019-09-08 NOTE — Assessment & Plan Note (Signed)
Does limit her some but doing okay overall On the hydrocodone still

## 2019-09-08 NOTE — Assessment & Plan Note (Signed)
I have personally reviewed the Medicare Annual Wellness questionnaire and have noted 1. The patient's medical and social history 2. Their use of alcohol, tobacco or illicit drugs 3. Their current medications and supplements 4. The patient's functional ability including ADL's, fall risks, home safety risks and hearing or visual             impairment. 5. Diet and physical activities 6. Evidence for depression or mood disorders  The patients weight, height, BMI and visual acuity have been recorded in the chart I have made referrals, counseling and provided education to the patient based review of the above and I have provided the pt with a written personalized care plan for preventive services.  I have provided you with a copy of your personalized plan for preventive services. Please take the time to review along with your updated medication list.  Had flu vaccine Colon due 2028--may do sooner if ongoing hemorrhoidal bleeding She will set up mammogram when COVID settles down some Pap due next year Will consider shingrix--price is an issue Discussed exercise

## 2019-09-08 NOTE — Assessment & Plan Note (Signed)
Has 1-2 bad days weekly---COVID definitely hard on her  she is now meeting virtually with psychologist weekly and this helps

## 2019-09-08 NOTE — Progress Notes (Signed)
Subjective:    Patient ID: Rachel Vaughn, female    DOB: 08/28/1961, 58 y.o.   MRN: [id number]  HPI Video virtual visit for Medicare wellness and follow up of chronic health conditions Identification done Reviewed billing and she gave consent She is at her home in [place]--and I am in my office Unable to check vision or hearing  Reviewed advanced directives Reviewed other doctors--Dr Ryan--psychologist (weekly via virtual), needs dentist in [place] No tobacco Will have 4-5 beers a couple of times a week--discussed limiting to 2-3 Has several falls--- perhaps 3 tripping episodes (no significant injuries) Vision and hearing are fine Independent with instrumental ADLs On sig memory issues  Having more back pain Not able to get out as much Does have to go out for her prescriptions--and will do shopping when she is out then Sister will pick up stuff at [facility] Does get out in her yard---but not much otherwise Will take dog out for a walk--but makes sure she is not near anyone else  Still anxious easily Some mild ongoing depression--- not able to spend time with sister, etc (she is out a lot and she is fearful) Does go back to her room and bed at times---but no persistent bad days (just isolated days or 2 per week)  Continues on the omeprazole Will still get episodic night chest pain---happened after eating right before bedtime No dysphagia  Current Outpatient Medications on File Prior to Visit  Medication Sig Dispense Refill  . busPIRone (BUSPAR) 10 MG tablet Take 1 tablet (10 mg total) by mouth 2 (two) times daily. 180 tablet 3  . cetirizine (ZYRTEC) 10 MG tablet Take 10 mg by mouth daily.    . citalopram (CELEXA) 20 MG tablet TAKE ONE TABLET BY MOUTH ONE TIME DAILY 90 tablet 3  . diclofenac sodium (VOLTAREN) 1 % GEL Apply 2 g topically 4 (four) times daily as needed. To hands 200 g 11  . fluticasone (FLONASE) 50 MCG/ACT nasal spray Place 2 sprays into both nostrils daily.     Marland Kitchen HYDROcodone-acetaminophen (NORCO) 7.5-325 MG tablet Take 1-2 tablets by mouth 2 (two) times daily as needed. 120 tablet 0  . hydrocortisone 2.5 % cream Apply topically 3 (three) times daily as needed. 28 g 3  . methylphenidate (RITALIN) 10 MG tablet Take 1 tablet (10 mg total) by mouth 2 (two) times daily. 60 tablet 0  . omeprazole (PRILOSEC) 20 MG capsule Take 1 capsule (20 mg total) by mouth 2 (two) times daily before a meal. 60 capsule 11  . temazepam (RESTORIL) 15 MG capsule Take 1-2 capsules (15-30 mg total) by mouth at bedtime as needed for sleep. 60 capsule 0  . tretinoin (RETIN-A) 0.01 % gel Apply topically at bedtime. 45 g 0  . triamcinolone cream (KENALOG) 0.1 % apply to affected area twice a day AS NEEDED 30 g 2  . valACYclovir (VALTREX) 1000 MG tablet Take 1 tablet (1,000 mg total) by mouth 2 (two) times daily as needed. 30 tablet 1  . VENTOLIN HFA 108 (90 Base) MCG/ACT inhaler INHALE 2 PUFFS BY MOUTH EVERY 6 HOURS IF NEEDED FOR WHEEZING OR SHORTNESS OF BREATH. USE WITH SPACER 18 g 2   No current facility-administered medications on file prior to visit.     Allergies  Allergen Reactions  . Fluoxetine Hcl     REACTION: unspecified  . Paroxetine     REACTION: unspecified  . Poison Ivy Extract   . Venlafaxine     REACTION: unspecified  Past Medical History:  Diagnosis Date  . Anxiety   . Asthma   . Diverticulitis   . GERD (gastroesophageal reflux disease)   . Social anxiety disorder     Past Surgical History:  Procedure Laterality Date  . BREAST ENHANCEMENT SURGERY  1987  . CESAREAN SECTION      Family History  Problem Relation Age of Onset  . Asthma Mother   . COPD Mother   . Diabetes Maternal Grandmother   . Cancer Neg Hx     Social History   Socioeconomic History  . Marital status: Divorced    Spouse name: Not on file  . Number of children: 2  . Years of education: Not on file  . Highest education level: Not on file  Occupational History  .  Occupation: Disabled ---anxiety  Social Needs  . Financial resource strain: Not on file  . Food insecurity    Worry: Not on file    Inability: Not on file  . Transportation needs    Medical: Not on file    Non-medical: Not on file  Tobacco Use  . Smoking status: Never Smoker  . Smokeless tobacco: Never Used  Substance and Sexual Activity  . Alcohol use: Yes  . Drug use: No  . Sexual activity: Not on file  Lifestyle  . Physical activity    Days per week: Not on file    Minutes per session: Not on file  . Stress: Not on file  Relationships  . Social Herbalist on phone: Not on file    Gets together: Not on file    Attends religious service: Not on file    Active member of club or organization: Not on file    Attends meetings of clubs or organizations: Not on file    Relationship status: Not on file  . Intimate partner violence    Fear of current or ex partner: Not on file    Emotionally abused: Not on file    Physically abused: Not on file    Forced sexual activity: Not on file  Other Topics Concern  . Not on file  Social History Narrative   No living will   Would want children to make decisions   Would accept resuscitation   Not sure about tube feeds    Review of Systems Appetite is good Weight is stable Sleeps okay with the medication Wears seat belt in car Holding off on dentist due to [place] move fine-no blood but occ hemorrhoid bleeding (on toilet paper only) Voids okay---remains continent No other chest pain No SOB No dizziness or syncope No edema    Objective:   Physical Exam  Constitutional: She is oriented to person, place, and time. She appears well-developed. No distress.  Respiratory: Effort normal. No respiratory distress.  Neurological: She is alert and oriented to person, place, and time.  President---"Donald Trump, Barack Obama, Bush" [id number] Recall 3/3  Psychiatric: Her behavior is normal.  Mild  anxiety with even virtual visit           Assessment & Plan:

## 2019-09-08 NOTE — Assessment & Plan Note (Signed)
See social history She is working on formal documents

## 2019-09-08 NOTE — Assessment & Plan Note (Addendum)
PDMP reviewed No concerns Will redo contract and do UDS when she is able to get into the office

## 2019-09-09 DIAGNOSIS — F4001 Agoraphobia with panic disorder: Secondary | ICD-10-CM | POA: Diagnosis not present

## 2019-09-21 DIAGNOSIS — F4001 Agoraphobia with panic disorder: Secondary | ICD-10-CM | POA: Diagnosis not present

## 2019-09-24 ENCOUNTER — Other Ambulatory Visit: Payer: Self-pay | Admitting: Internal Medicine

## 2019-09-24 ENCOUNTER — Telehealth: Payer: Self-pay

## 2019-09-24 NOTE — Telephone Encounter (Signed)
Spoke to pt. She does have some of the cream and will try it.

## 2019-09-24 NOTE — Telephone Encounter (Signed)
It is not unusual to bleed for a few days from a hemorrhoid that has acted up. I can't tell how much bleeding she has had. Make sure she still has the 2.5% hydrocortisone cream to use tid regularly till things settle down (send refill if needed). If persists, or gets heavier, she will need to get seen somewhere

## 2019-09-24 NOTE — Telephone Encounter (Signed)
Pt is still in [place]; pt said for 2 days which is longest time pt has had rectal bleeding; pt had diarrhea 2 days ago and then started with rectal bleeding where pt sits on commode for 20' and has bleeding and then clots. Pt said she has internal and external hemorrhoids and is not sure whichis bleeding this time,. Pt said spoke with Dr Silvio Pate at [facility] first of Dec about the bleeding issue. Advised pt if this is more bleeding than she usually has and is having weakness she should go to UC or ED for eval. Pt said no she is not panicking but is more frustrated and wants to know if Dr Silvio Pate thinks this is her normal or has she gone beyond that and what Dr Silvio Pate advises pt to do. Pt request cb. UC & ED precautions given and pt voiced understanding.

## 2019-09-25 DIAGNOSIS — Z03818 Encounter for observation for suspected exposure to other biological agents ruled out: Secondary | ICD-10-CM | POA: Diagnosis not present

## 2019-10-05 DIAGNOSIS — F4001 Agoraphobia with panic disorder: Secondary | ICD-10-CM | POA: Diagnosis not present

## 2019-10-07 ENCOUNTER — Other Ambulatory Visit: Payer: Self-pay

## 2019-10-07 MED ORDER — TEMAZEPAM 15 MG PO CAPS: 15.0000 mg | ORAL_CAPSULE | Freq: Every evening | ORAL | 0 refills | Status: DC | PRN

## 2019-10-07 MED ORDER — METHYLPHENIDATE HCL 10 MG PO TABS: 10.0000 mg | ORAL_TABLET | Freq: Two times a day (BID) | ORAL | 0 refills | Status: DC

## 2019-10-07 NOTE — Telephone Encounter (Signed)
Methylphenidate last filled 08-21-19 #60 Temazepam last filled 09-07-19 #60 Last OV 09-08-19 Next OV 12-14-19 [facility] [place]  Forwarding to [facility] in Dr Alla German absence

## 2019-10-26 ENCOUNTER — Other Ambulatory Visit: Payer: Self-pay

## 2019-10-26 MED ORDER — HYDROCODONE-ACETAMINOPHEN 7.5-325 MG PO TABS: 1.0000 | ORAL_TABLET | Freq: Two times a day (BID) | ORAL | 0 refills | Status: DC | PRN

## 2019-10-26 NOTE — Telephone Encounter (Signed)
Name of Medication: Hydrocodone  Name of Pharmacy: [facility] [place]  Last Barry or Written Date and Quantity: 09/07/2019 #120 with 0 refill  Last Office Visit and Type: 09/24/2019  Next Office Visit and Type: [id number]  Last Controlled Substance Agreement Date: 11-21-17  Last UDS: 11-21-17

## 2019-11-11 ENCOUNTER — Other Ambulatory Visit: Payer: Self-pay | Admitting: Internal Medicine

## 2019-11-11 MED ORDER — TEMAZEPAM 15 MG PO CAPS: 15.0000 mg | ORAL_CAPSULE | Freq: Every evening | ORAL | 0 refills | Status: DC | PRN

## 2019-11-11 NOTE — Telephone Encounter (Signed)
Last filled on 10/07/2019 #60 with 0 refill.  LOV 09/08/19 CPE Next appointment on 12/14/2019 Follow up

## 2019-11-18 ENCOUNTER — Telehealth: Payer: Self-pay | Admitting: Internal Medicine

## 2019-11-18 NOTE — Telephone Encounter (Signed)
I called patient to reschedule her appointment with Dr.Letvak on 12/14/19 for a virtual visit.  Patient thought Dr.Letvak wanted to see her in person, so she just purchased a ticket to fly here for her appointment.  I rescheduled patient's appointment to 12/23/19.  Do you want appointment to be a virtual or in person visit?

## 2019-11-18 NOTE — Telephone Encounter (Signed)
I do want to see her in person when travel is reasonable. If she will have had both her COVID vaccines by then, it would be good to see her in person (but virtual if she hasn't been able to get her vaccines by then)

## 2019-11-18 NOTE — Telephone Encounter (Signed)
I left a message for patient to return my call. 

## 2019-11-19 NOTE — Telephone Encounter (Signed)
Patient said she wants to keep the visit virtual.

## 2019-11-19 NOTE — Telephone Encounter (Signed)
I returned patient's call, but she didn't answer. I left a message on patient's voice mail to call me back.

## 2019-11-19 NOTE — Telephone Encounter (Signed)
I spoke to patient and she wanted to let me know her health insurance has changed to [facility].

## 2019-11-19 NOTE — Telephone Encounter (Signed)
Patient is requesting a call back from you.   Patient stated she has another question that she wanted to speak with you about

## 2019-11-19 NOTE — Telephone Encounter (Signed)
Virtual is fine And we are on [facility]

## 2019-12-07 ENCOUNTER — Other Ambulatory Visit: Payer: Self-pay

## 2019-12-07 ENCOUNTER — Other Ambulatory Visit: Payer: Self-pay | Admitting: Internal Medicine

## 2019-12-07 NOTE — Telephone Encounter (Signed)
Name of Medication:Methylphenidate Name of Pharmacy:[facility] [place] Last Fill or Written Date and Quantity:10/07/19 #60 Last Office Visit and Type:09/24/19 Next Office Visit and Type:12/14/19

## 2019-12-07 NOTE — Telephone Encounter (Signed)
Name of Medication:Hydrocodone Name of Pharmacy:[facility] [place] Last Fill or Written Date and Quantity:10/26/19 #120  Last Office Visit and Type:09/24/2019 Next Office Visit and YYPE:[id number] Last Controlled Substance Agreement Date:10-29-19 Last UDS:11-21-17

## 2019-12-08 MED ORDER — HYDROCODONE-ACETAMINOPHEN 7.5-325 MG PO TABS: 1.0000 | ORAL_TABLET | Freq: Two times a day (BID) | ORAL | 0 refills | Status: DC | PRN

## 2019-12-08 MED ORDER — METHYLPHENIDATE HCL 10 MG PO TABS: 10.0000 mg | ORAL_TABLET | Freq: Two times a day (BID) | ORAL | 0 refills | Status: DC

## 2019-12-14 ENCOUNTER — Ambulatory Visit: Payer: Medicare (Managed Care) | Admitting: Internal Medicine

## 2019-12-14 ENCOUNTER — Other Ambulatory Visit: Payer: Self-pay | Admitting: Internal Medicine

## 2019-12-14 MED ORDER — TEMAZEPAM 15 MG PO CAPS: 15.0000 mg | ORAL_CAPSULE | Freq: Every evening | ORAL | 0 refills | Status: DC | PRN

## 2019-12-14 NOTE — Telephone Encounter (Signed)
Last filled 11-11-19 #60 Last OV 09-08-19 Next OV 12-23-19 [facility] [place]

## 2019-12-23 ENCOUNTER — Other Ambulatory Visit: Payer: Self-pay

## 2019-12-23 ENCOUNTER — Ambulatory Visit (INDEPENDENT_AMBULATORY_CARE_PROVIDER_SITE_OTHER): Payer: Medicare (Managed Care) | Admitting: Internal Medicine

## 2019-12-23 ENCOUNTER — Encounter: Payer: Self-pay | Admitting: Internal Medicine

## 2019-12-23 ENCOUNTER — Telehealth: Payer: Self-pay | Admitting: Internal Medicine

## 2019-12-23 DIAGNOSIS — F411 Generalized anxiety disorder: Secondary | ICD-10-CM

## 2019-12-23 DIAGNOSIS — G8929 Other chronic pain: Secondary | ICD-10-CM

## 2019-12-23 DIAGNOSIS — M5440 Lumbago with sciatica, unspecified side: Secondary | ICD-10-CM

## 2019-12-23 DIAGNOSIS — F1120 Opioid dependence, uncomplicated: Secondary | ICD-10-CM | POA: Diagnosis not present

## 2019-12-23 NOTE — Assessment & Plan Note (Signed)
Chronic anxiety continues Uses the temazepam regularly Chronic sleep issues--stable

## 2019-12-23 NOTE — Progress Notes (Signed)
Subjective:    Patient ID: Rachel Vaughn, female    DOB: [birth year], 60 y.o.   MRN: [id number]  HPI  Virtual visit for review of chronic pain and anxiety Identification done Reviewed billing and she gave her consent Participants--patient in her home in [place] and I am in my office  Her back and hips are doing terrible Right hip especially bad---goes back 6 months.  May have pulled something then Hurts mostly with abduction of hip  Continues on the hydrocodone at same frequency Feels like she needs 3rd at times--but avoids  Ongoing severe anxiety Continues to do lots of work on [employer] changes for garage Took up some pavers in back yard--and started a garden  Current Outpatient Medications on File Prior to Visit  Medication Sig Dispense Refill  . busPIRone (BUSPAR) 10 MG tablet TAKE ONE TABLET BY MOUTH TWICE A DAY 180 tablet 3  . cetirizine (ZYRTEC) 10 MG tablet Take 10 mg by mouth daily.    . citalopram (CELEXA) 20 MG tablet TAKE ONE TABLET BY MOUTH ONE TIME DAILY 90 tablet 3  . diclofenac sodium (VOLTAREN) 1 % GEL Apply 2 g topically 4 (four) times daily as needed. To hands 200 g 11  . fluticasone (FLONASE) 50 MCG/ACT nasal spray Place 2 sprays into both nostrils daily.    Marland Kitchen HYDROcodone-acetaminophen (NORCO) 7.5-325 MG tablet Take 1-2 tablets by mouth 2 (two) times daily as needed. 120 tablet 0  . hydrocortisone 2.5 % cream Apply topically 3 (three) times daily as needed. 28 g 3  . methylphenidate (RITALIN) 10 MG tablet Take 1 tablet (10 mg total) by mouth 2 (two) times daily. 60 tablet 0  . omeprazole (PRILOSEC) 20 MG capsule Take 1 capsule (20 mg total) by mouth 2 (two) times daily before a meal. 60 capsule 11  . temazepam (RESTORIL) 15 MG capsule Take 1-2 capsules (15-30 mg total) by mouth at bedtime as needed for sleep. 60 capsule 0  . tretinoin (RETIN-A) 0.01 % gel Apply topically at bedtime. 45 g 0  . triamcinolone cream (KENALOG) 0.1 % apply to affected area  twice a day AS NEEDED 30 g 2  . valACYclovir (VALTREX) 1000 MG tablet Take 1 tablet (1,000 mg total) by mouth 2 (two) times daily as needed. 30 tablet 1  . VENTOLIN HFA 108 (90 Base) MCG/ACT inhaler INHALE 2 PUFFS BY MOUTH EVERY 6 HOURS IF NEEDED FOR WHEEZING OR SHORTNESS OF BREATH. USE WITH SPACER 18 g 2   No current facility-administered medications on file prior to visit.    Allergies  Allergen Reactions  . Fluoxetine Hcl     REACTION: unspecified  . Paroxetine     REACTION: unspecified  . Poison Ivy Extract   . Venlafaxine     REACTION: unspecified    Past Medical History:  Diagnosis Date  . Anxiety   . Asthma   . Diverticulitis   . GERD (gastroesophageal reflux disease)   . Social anxiety disorder     Past Surgical History:  Procedure Laterality Date  . BREAST ENHANCEMENT SURGERY  1987  . CESAREAN SECTION      Family History  Problem Relation Age of Onset  . Asthma Mother   . COPD Mother   . Diabetes Maternal Grandmother   . Cancer Neg Hx     Social History   Socioeconomic History  . Marital status: Divorced    Spouse name: Not on file  . Number of children: 2  . Years of  education: Not on file  . Highest education level: Not on file  Occupational History  . Occupation: Disabled ---anxiety  Tobacco Use  . Smoking status: Never Smoker  . Smokeless tobacco: Never Used  Substance and Sexual Activity  . Alcohol use: Yes  . Drug use: No  . Sexual activity: Not on file  Other Topics Concern  . Not on file  Social History Narrative   No living will   Would want children to make decisions   Would accept resuscitation   Not sure about tube feeds    Social Determinants of Health   Financial Resource Strain:   . Difficulty of Paying Living Expenses:   Food Insecurity:   . Worried About Programme researcher, broadcasting/film/video in the Last Year:   . Barista in the Last Year:   Transportation Needs:   . Freight forwarder (Medical):   Marland Kitchen Lack of Transportation  (Non-Medical):   Physical Activity:   . Days of Exercise per Week:   . Minutes of Exercise per Session:   Stress:   . Feeling of Stress :   Social Connections:   . Frequency of Communication with Friends and Family:   . Frequency of Social Gatherings with Friends and Family:   . Attends Religious Services:   . Active Member of Clubs or Organizations:   . Attends Banker Meetings:   Marland Kitchen Marital Status:   Intimate Partner Violence:   . Fear of Current or Ex-Partner:   . Emotionally Abused:   Marland Kitchen Physically Abused:   . Sexually Abused:    Review of Systems Eating okay--trying to cut back some Weight up slightly Not sleeping as well due to hip pain and right shoulder pain In trial for Laural Benes and Laural Benes vaccine--now will be unblinded and should get vaccine either way    Objective:   Physical Exam  Constitutional: She appears well-developed. No distress.  Musculoskeletal:     Comments: Fairly normal internal rotation in right hip  Psychiatric: She has a normal mood and affect. Her behavior is normal.           Assessment & Plan:

## 2019-12-23 NOTE — Telephone Encounter (Signed)
I left a message on patient's voice mail to return my call.  Patient needs to schedule 3 mth virtual follow up with Dr.Letvak.

## 2019-12-23 NOTE — Assessment & Plan Note (Signed)
Now with more hip pain Doesn't seem like severe OA---likely soft tissue Will need in person evaluation to get better idea

## 2019-12-23 NOTE — Assessment & Plan Note (Signed)
PDMP reviewed Filling in [place] so doesn't show up She is compliant with her requests

## 2019-12-24 ENCOUNTER — Telehealth: Payer: Self-pay | Admitting: Internal Medicine

## 2019-12-24 NOTE — Telephone Encounter (Signed)
Patient received a letter in the mail this morning from insurance stating that they'll only fill #30 of Temazepam. An exemption can be sent in to get #60. Dr.Letvak had discussed this with patient yesterday, but she just received the notification today.

## 2019-12-25 NOTE — Telephone Encounter (Signed)
Sent pt a MyChart message asking her to send me a copy of the notification so I know who to contact.

## 2020-01-11 ENCOUNTER — Telehealth: Payer: Self-pay

## 2020-01-12 ENCOUNTER — Other Ambulatory Visit: Payer: Self-pay | Admitting: Internal Medicine

## 2020-01-12 MED ORDER — HYDROCODONE-ACETAMINOPHEN 7.5-325 MG PO TABS: 1.0000 | ORAL_TABLET | Freq: Two times a day (BID) | ORAL | 0 refills | Status: DC | PRN

## 2020-01-12 MED ORDER — TEMAZEPAM 15 MG PO CAPS: 15.0000 mg | ORAL_CAPSULE | Freq: Every evening | ORAL | 0 refills | Status: DC | PRN

## 2020-01-12 MED ORDER — METHYLPHENIDATE HCL 10 MG PO TABS: 10.0000 mg | ORAL_TABLET | Freq: Two times a day (BID) | ORAL | 0 refills | Status: DC

## 2020-01-12 NOTE — Telephone Encounter (Signed)
Crystal with [facility] in [place] left v/m that for the Norco 7.5 -325 mg rx to be filled Crystal needs cb; Norco rx has to say non acute pain for the med to be dispensed.

## 2020-01-12 NOTE — Telephone Encounter (Signed)
Name of Medication:Hydrocodone Name of Pharmacy:[facility] [place] Last Written Date and Quantity: 12/07/19#120 Last Office Visit and Type:12/23/19 Next Office Visit and Type:03/23/20 Last Controlled Substance Agreement Date:10-29-19 Last UDS:11-21-17  Temazepam last written 12-14-19 #30  Methylphenidate last written 12-08-19 #60

## 2020-01-13 NOTE — Telephone Encounter (Signed)
Spoke to [place] at the pharmacy. He said he just realized we were out of state and that did not apply to [place].

## 2020-02-12 ENCOUNTER — Other Ambulatory Visit: Payer: Self-pay

## 2020-02-12 MED ORDER — HYDROCODONE-ACETAMINOPHEN 7.5-325 MG PO TABS: 1.0000 | ORAL_TABLET | Freq: Two times a day (BID) | ORAL | 0 refills | Status: DC | PRN

## 2020-02-12 MED ORDER — TEMAZEPAM 15 MG PO CAPS: 15.0000 mg | ORAL_CAPSULE | Freq: Every evening | ORAL | 0 refills | Status: DC | PRN

## 2020-02-12 NOTE — Telephone Encounter (Signed)
Name of Medication:Hydrocodone Name of Pharmacy:[facility] [place] Last Written Date and Quantity: 01/11/20#120 Last Office Visit and Type:12/23/19 Next Office Visit and Type:03/23/20 Last Controlled Substance Agreement Date:10-29-19 Last UDS:11-21-17  Temazepam last written 01/11/20 #30

## 2020-03-13 ENCOUNTER — Other Ambulatory Visit: Payer: Self-pay

## 2020-03-14 MED ORDER — TEMAZEPAM 15 MG PO CAPS: 15.0000 mg | ORAL_CAPSULE | Freq: Every evening | ORAL | 0 refills | Status: DC | PRN

## 2020-03-14 MED ORDER — METHYLPHENIDATE HCL 10 MG PO TABS: 10.0000 mg | ORAL_TABLET | Freq: Two times a day (BID) | ORAL | 0 refills | Status: DC

## 2020-03-14 MED ORDER — HYDROCODONE-ACETAMINOPHEN 7.5-325 MG PO TABS: 1.0000 | ORAL_TABLET | Freq: Two times a day (BID) | ORAL | 0 refills | Status: DC | PRN

## 2020-03-14 NOTE — Telephone Encounter (Signed)
Name of Medication:Hydrocodone Name of Pharmacy:[facility] [place] Last Written Date and Quantity:02/12/20#120 Last Office Visit and Type:12/23/19 Next Office Visit and Type:03/23/20 Last Controlled Substance Agreement Date:10-29-19 Last UDS:11-21-17  Temazepam last written 02/12/20 #30  Methylphenidate last written 01/12/20 #60

## 2020-03-23 ENCOUNTER — Telehealth (INDEPENDENT_AMBULATORY_CARE_PROVIDER_SITE_OTHER): Payer: Medicare (Managed Care) | Admitting: Internal Medicine

## 2020-03-23 ENCOUNTER — Encounter: Payer: Self-pay | Admitting: Internal Medicine

## 2020-03-23 VITALS — Wt 127.0 lb

## 2020-03-23 DIAGNOSIS — M5440 Lumbago with sciatica, unspecified side: Secondary | ICD-10-CM

## 2020-03-23 DIAGNOSIS — F1120 Opioid dependence, uncomplicated: Secondary | ICD-10-CM

## 2020-03-23 DIAGNOSIS — F411 Generalized anxiety disorder: Secondary | ICD-10-CM | POA: Diagnosis not present

## 2020-03-23 DIAGNOSIS — G8929 Other chronic pain: Secondary | ICD-10-CM | POA: Diagnosis not present

## 2020-03-23 NOTE — Assessment & Plan Note (Signed)
Has maintained her function Continues on the hydrocodone

## 2020-03-23 NOTE — Assessment & Plan Note (Signed)
PDMP reviewed No records since she fills in [place]

## 2020-03-23 NOTE — Assessment & Plan Note (Signed)
Chronic and disabling Stable on her medications

## 2020-03-23 NOTE — Progress Notes (Signed)
Subjective:    Patient ID: Rachel Vaughn, female    DOB: 1961/06/07, 59 y.o.   MRN: [id number]  HPI Video virtual visit for review of chronic back pain and anxiety Identification done Reviewed limitations and billing and she gave consent Participants---patient at her home in [place], and I am in my office  Her back pain is about the same Also has some pain from small umbilical hernia which is larger (tender). Notices it if she strains going to the bathroom  Still using hydrocodone three times a day  Still working in garden Does all her instrumental ADLs  Ongoing anxiety Goes out sometimes to shop Sister lives a block away now also Not depressed  Current Outpatient Medications on File Prior to Visit  Medication Sig Dispense Refill  . busPIRone (BUSPAR) 10 MG tablet TAKE ONE TABLET BY MOUTH TWICE A DAY 180 tablet 3  . cetirizine (ZYRTEC) 10 MG tablet Take 10 mg by mouth daily.    . citalopram (CELEXA) 20 MG tablet TAKE ONE TABLET BY MOUTH ONE TIME DAILY 90 tablet 3  . diclofenac sodium (VOLTAREN) 1 % GEL Apply 2 g topically 4 (four) times daily as needed. To hands 200 g 11  . fluticasone (FLONASE) 50 MCG/ACT nasal spray Place 2 sprays into both nostrils daily.    Marland Kitchen HYDROcodone-acetaminophen (NORCO) 7.5-325 MG tablet Take 1-2 tablets by mouth 2 (two) times daily as needed. 120 tablet 0  . hydrocortisone 2.5 % cream Apply topically 3 (three) times daily as needed. 28 g 3  . methylphenidate (RITALIN) 10 MG tablet Take 1 tablet (10 mg total) by mouth 2 (two) times daily. 60 tablet 0  . omeprazole (PRILOSEC) 20 MG capsule TAKE ONE CAPSULE BY MOUTH TWICE A DAY BEFORE A MEAL 60 capsule 11  . temazepam (RESTORIL) 15 MG capsule Take 1-2 capsules (15-30 mg total) by mouth at bedtime as needed for sleep. 60 capsule 0  . tretinoin (RETIN-A) 0.01 % gel Apply topically at bedtime. 45 g 0  . triamcinolone cream (KENALOG) 0.1 % apply to affected area twice a day AS NEEDED 30 g 2  .  valACYclovir (VALTREX) 1000 MG tablet Take 1 tablet (1,000 mg total) by mouth 2 (two) times daily as needed. 30 tablet 1  . VENTOLIN HFA 108 (90 Base) MCG/ACT inhaler INHALE 2 PUFFS BY MOUTH EVERY 6 HOURS IF NEEDED FOR WHEEZING OR SHORTNESS OF BREATH. USE WITH SPACER 18 g 2   No current facility-administered medications on file prior to visit.    Allergies  Allergen Reactions  . Fluoxetine Hcl     REACTION: unspecified  . Paroxetine     REACTION: unspecified  . Poison Ivy Extract   . Venlafaxine     REACTION: unspecified    Past Medical History:  Diagnosis Date  . Anxiety   . Asthma   . Diverticulitis   . GERD (gastroesophageal reflux disease)   . Social anxiety disorder     Past Surgical History:  Procedure Laterality Date  . BREAST ENHANCEMENT SURGERY  1987  . CESAREAN SECTION      Family History  Problem Relation Age of Onset  . Asthma Mother   . COPD Mother   . Diabetes Maternal Grandmother   . Cancer Neg Hx     Social History   Socioeconomic History  . Marital status: Divorced    Spouse name: Not on file  . Number of children: 2  . Years of education: Not on file  .  Highest education level: Not on file  Occupational History  . Occupation: Disabled ---anxiety  Tobacco Use  . Smoking status: Never Smoker  . Smokeless tobacco: Never Used  Substance and Sexual Activity  . Alcohol use: Yes  . Drug use: No  . Sexual activity: Not on file  Other Topics Concern  . Not on file  Social History Narrative   No living will   Would want children to make decisions   Would accept resuscitation   Not sure about tube feeds    Social Determinants of Health   Financial Resource Strain:   . Difficulty of Paying Living Expenses:   Food Insecurity:   . Worried About Charity fundraiser in the Last Year:   . Arboriculturist in the Last Year:   Transportation Needs:   . Film/video editor (Medical):   Marland Kitchen Lack of Transportation (Non-Medical):   Physical  Activity:   . Days of Exercise per Week:   . Minutes of Exercise per Session:   Stress:   . Feeling of Stress :   Social Connections:   . Frequency of Communication with Friends and Family:   . Frequency of Social Gatherings with Friends and Family:   . Attends Religious Services:   . Active Member of Clubs or Organizations:   . Attends Archivist Meetings:   Marland Kitchen Marital Status:   Intimate Partner Violence:   . Fear of Current or Ex-Partner:   . Emotionally Abused:   Marland Kitchen Physically Abused:   . Sexually Abused:    Review of Systems Has started probiotics to help chronic diarrhea No N/V Appetite is okay     Objective:   Physical Exam  GI: Normal appearance.  Slight bulge above and to the right of her umbilicus--no clear hernia  Neurological: She is alert.           Assessment & Plan:

## 2020-04-13 ENCOUNTER — Other Ambulatory Visit: Payer: Self-pay

## 2020-04-13 MED ORDER — HYDROCODONE-ACETAMINOPHEN 7.5-325 MG PO TABS: 1.0000 | ORAL_TABLET | Freq: Two times a day (BID) | ORAL | 0 refills | Status: DC | PRN

## 2020-04-13 MED ORDER — TEMAZEPAM 15 MG PO CAPS: 15.0000 mg | ORAL_CAPSULE | Freq: Every evening | ORAL | 0 refills | Status: DC | PRN

## 2020-04-13 MED ORDER — METHYLPHENIDATE HCL 10 MG PO TABS: 10.0000 mg | ORAL_TABLET | Freq: Two times a day (BID) | ORAL | 0 refills | Status: DC

## 2020-04-13 NOTE — Telephone Encounter (Signed)
Name of Medication:Hydrocodone Name of Pharmacy:[facility] [place] Last Written Date and Quantity:03/14/20#120 Last Office Visit and Type:03/23/20 Next Office Visit and Type:05/02/20 Last Controlled Substance Agreement Date:10-29-19 Last UDS:11-21-17  Temazepam last written6/7/21#30  Methylphenidate last written 03/14/20 #60

## 2020-05-02 ENCOUNTER — Ambulatory Visit (INDEPENDENT_AMBULATORY_CARE_PROVIDER_SITE_OTHER): Payer: Medicare (Managed Care) | Admitting: Internal Medicine

## 2020-05-02 ENCOUNTER — Encounter: Payer: Self-pay | Admitting: Internal Medicine

## 2020-05-02 ENCOUNTER — Other Ambulatory Visit: Payer: Self-pay

## 2020-05-02 VITALS — BP 122/80 | HR 65 | Temp 97.6°F | Ht 65.0 in | Wt 128.0 lb

## 2020-05-02 DIAGNOSIS — F411 Generalized anxiety disorder: Secondary | ICD-10-CM | POA: Diagnosis not present

## 2020-05-02 DIAGNOSIS — F3341 Major depressive disorder, recurrent, in partial remission: Secondary | ICD-10-CM

## 2020-05-02 DIAGNOSIS — K439 Ventral hernia without obstruction or gangrene: Secondary | ICD-10-CM | POA: Diagnosis not present

## 2020-05-02 DIAGNOSIS — F1320 Sedative, hypnotic or anxiolytic dependence, uncomplicated: Secondary | ICD-10-CM

## 2020-05-02 DIAGNOSIS — K2100 Gastro-esophageal reflux disease with esophagitis, without bleeding: Secondary | ICD-10-CM

## 2020-05-02 LAB — CBC
HCT: 39.1 % (ref 36.0–46.0)
Hemoglobin: 13 g/dL (ref 12.0–15.0)
MCHC: 33.2 g/dL (ref 30.0–36.0)
MCV: 98.4 fl (ref 78.0–100.0)
Platelets: 252 10*3/uL (ref 150.0–400.0)
RBC: 3.98 Mil/uL (ref 3.87–5.11)
RDW: 13.4 % (ref 11.5–15.5)
WBC: 6.5 10*3/uL (ref 4.0–10.5)

## 2020-05-02 LAB — COMPREHENSIVE METABOLIC PANEL
ALT: 15 U/L (ref 0–35)
AST: 23 U/L (ref 0–37)
Albumin: 4.5 g/dL (ref 3.5–5.2)
Alkaline Phosphatase: 64 U/L (ref 39–117)
BUN: 14 mg/dL (ref 6–23)
CO2: 28 mEq/L (ref 19–32)
Calcium: 9.6 mg/dL (ref 8.4–10.5)
Chloride: 100 mEq/L (ref 96–112)
Creatinine, Ser: 0.8 mg/dL (ref 0.40–1.20)
GFR: 73.1 mL/min (ref >60.00)
Glucose, Bld: 91 mg/dL (ref 70–99)
Potassium: 4.2 mEq/L (ref 3.5–5.1)
Sodium: 137 mEq/L (ref 135–145)
Total Bilirubin: 0.4 mg/dL (ref 0.2–1.2)
Total Protein: 7.3 g/dL (ref 6.0–8.3)

## 2020-05-02 NOTE — Assessment & Plan Note (Signed)
Ongoing severe anxiety Had real hard time with pharmacy policies---is working through that Will continue the temazepam

## 2020-05-02 NOTE — Assessment & Plan Note (Signed)
Has recurrent symptoms when she goes back to daily Asked her to continue the bid dosing

## 2020-05-02 NOTE — Assessment & Plan Note (Signed)
Small but seems to be incarcerated--I suspect there is no bowel in there Discussed wanting to avoid surgery--but will refer back to surgeon if has increased pain

## 2020-05-02 NOTE — Progress Notes (Signed)
Subjective:    Patient ID: Rachel Vaughn, female    DOB: 27-Aug-1961, 59 y.o.   MRN: [id number]  HPI Here due to concerns about a hernia This visit occurred during the SARS-CoV-2 public health emergency.  Safety protocols were in place, including screening questions prior to the visit, additional usage of staff PPE, and extensive cleaning of exam room while observing appropriate contact time as indicated for disinfecting solutions.   Has ongoing concerns about a ventral hernia Has had bulge to the right of umbilicus since past surgery Did see surgeon in the past---observation only Some pain--but has calmed down  Also notes increased GERD symptoms Will feel "sore" when swallowing at times No feeling of food getting stuck Flare ups once or twice a month--despite omeprazole (does increase to bid when flares)  Current Outpatient Medications on File Prior to Visit  Medication Sig Dispense Refill  . busPIRone (BUSPAR) 10 MG tablet TAKE ONE TABLET BY MOUTH TWICE A DAY 180 tablet 3  . cetirizine (ZYRTEC) 10 MG tablet Take 10 mg by mouth daily.    . citalopram (CELEXA) 20 MG tablet TAKE ONE TABLET BY MOUTH ONE TIME DAILY 90 tablet 3  . diclofenac sodium (VOLTAREN) 1 % GEL Apply 2 g topically 4 (four) times daily as needed. To hands 200 g 11  . fluticasone (FLONASE) 50 MCG/ACT nasal spray Place 2 sprays into both nostrils daily.    Marland Kitchen HYDROcodone-acetaminophen (NORCO) 7.5-325 MG tablet Take 1-2 tablets by mouth 2 (two) times daily as needed. 120 tablet 0  . hydrocortisone 2.5 % cream Apply topically 3 (three) times daily as needed. 28 g 3  . methylphenidate (RITALIN) 10 MG tablet Take 1 tablet (10 mg total) by mouth 2 (two) times daily. 60 tablet 0  . omeprazole (PRILOSEC) 20 MG capsule TAKE ONE CAPSULE BY MOUTH TWICE A DAY BEFORE A MEAL 60 capsule 11  . temazepam (RESTORIL) 15 MG capsule Take 1-2 capsules (15-30 mg total) by mouth at bedtime as needed for sleep. 60 capsule 0  . tretinoin  (RETIN-A) 0.01 % gel Apply topically at bedtime. 45 g 0  . triamcinolone cream (KENALOG) 0.1 % apply to affected area twice a day AS NEEDED 30 g 2  . valACYclovir (VALTREX) 1000 MG tablet Take 1 tablet (1,000 mg total) by mouth 2 (two) times daily as needed. 30 tablet 1  . VENTOLIN HFA 108 (90 Base) MCG/ACT inhaler INHALE 2 PUFFS BY MOUTH EVERY 6 HOURS IF NEEDED FOR WHEEZING OR SHORTNESS OF BREATH. USE WITH SPACER 18 g 2   No current facility-administered medications on file prior to visit.    Allergies  Allergen Reactions  . Fluoxetine Hcl     REACTION: unspecified  . Paroxetine     REACTION: unspecified  . Poison Ivy Extract   . Venlafaxine     REACTION: unspecified    Past Medical History:  Diagnosis Date  . Anxiety   . Asthma   . Diverticulitis   . GERD (gastroesophageal reflux disease)   . Social anxiety disorder     Past Surgical History:  Procedure Laterality Date  . BREAST ENHANCEMENT SURGERY  1987  . CESAREAN SECTION      Family History  Problem Relation Age of Onset  . Asthma Mother   . COPD Mother   . Diabetes Maternal Grandmother   . Cancer Neg Hx     Social History   Socioeconomic History  . Marital status: Divorced    Spouse name: Not  on file  . Number of children: 2  . Years of education: Not on file  . Highest education level: Not on file  Occupational History  . Occupation: Disabled ---anxiety  Tobacco Use  . Smoking status: Never Smoker  . Smokeless tobacco: Never Used  Substance and Sexual Activity  . Alcohol use: Yes  . Drug use: No  . Sexual activity: Not on file  Other Topics Concern  . Not on file  Social History Narrative   No living will   Would want children to make decisions   Would accept resuscitation   Not sure about tube feeds    Social Determinants of Health   Financial Resource Strain:   . Difficulty of Paying Living Expenses:   Food Insecurity:   . Worried About Programme researcher, broadcasting/film/video in the Last Year:   . Garment/textile technologist in the Last Year:   Transportation Needs:   . Freight forwarder (Medical):   Marland Kitchen Lack of Transportation (Non-Medical):   Physical Activity:   . Days of Exercise per Week:   . Minutes of Exercise per Session:   Stress:   . Feeling of Stress :   Social Connections:   . Frequency of Communication with Friends and Family:   . Frequency of Social Gatherings with Friends and Family:   . Attends Religious Services:   . Active Member of Clubs or Organizations:   . Attends Banker Meetings:   Marland Kitchen Marital Status:   Intimate Partner Violence:   . Fear of Current or Ex-Partner:   . Emotionally Abused:   Marland Kitchen Physically Abused:   . Sexually Abused:    Review of Systems Bowels regular Appetite is okay Had another problem with pharmacy---wouldn't fill temazepam since she was 1 day early This caused panic, etc.  Continues her counseling with Dr Ryan--every week    Objective:   Physical Exam Constitutional:      Appearance: Normal appearance.  Abdominal:     Palpations: Abdomen is soft.     Comments: Small non reducible mass at fascial level to right of umbilicus. Slightly tender Doesn't seem large enough for bowel  Neurological:     Mental Status: She is alert.            Assessment & Plan:

## 2020-05-02 NOTE — Assessment & Plan Note (Signed)
PDMP empty--no [place] listing No concerns though

## 2020-05-02 NOTE — Assessment & Plan Note (Signed)
Controlled for the most part with the citalopram

## 2020-05-04 LAB — DRUG MONITORING, PANEL 8 WITH CONFIRMATION, URINE
6 Acetylmorphine: NEGATIVE ng/mL (ref <10)
Alcohol Metabolites: POSITIVE ng/mL — AB
Alphahydroxyalprazolam: NEGATIVE ng/mL (ref <25)
Alphahydroxymidazolam: NEGATIVE ng/mL (ref <50)
Alphahydroxytriazolam: NEGATIVE ng/mL (ref <50)
Aminoclonazepam: NEGATIVE ng/mL (ref <25)
Amphetamines: NEGATIVE ng/mL (ref <500)
Benzodiazepines: POSITIVE ng/mL — AB (ref <100)
Buprenorphine, Urine: NEGATIVE ng/mL (ref <5)
Cocaine Metabolite: NEGATIVE ng/mL (ref <150)
Codeine: NEGATIVE ng/mL (ref <50)
Creatinine: 100.5 mg/dL
Ethyl Glucuronide (ETG): 82539 ng/mL — ABNORMAL HIGH (ref <500)
Ethyl Sulfate (ETS): 20369 ng/mL — ABNORMAL HIGH (ref <100)
Hydrocodone: 890 ng/mL — ABNORMAL HIGH (ref <50)
Hydromorphone: NEGATIVE ng/mL (ref <50)
Hydroxyethylflurazepam: NEGATIVE ng/mL (ref <50)
Lorazepam: NEGATIVE ng/mL (ref <50)
MDMA: NEGATIVE ng/mL (ref <500)
Marijuana Metabolite: NEGATIVE ng/mL (ref <20)
Morphine: NEGATIVE ng/mL (ref <50)
Nordiazepam: NEGATIVE ng/mL (ref <50)
Norhydrocodone: 2282 ng/mL — ABNORMAL HIGH (ref <50)
Opiates: POSITIVE ng/mL — AB (ref <100)
Oxazepam: 1055 ng/mL — ABNORMAL HIGH (ref <50)
Oxidant: NEGATIVE ug/mL
Oxycodone: NEGATIVE ng/mL (ref <100)
Temazepam: >8000 ng/mL — ABNORMAL HIGH (ref <50)
pH: 7.3 (ref 4.5–9.0)

## 2020-05-04 LAB — DM TEMPLATE

## 2020-05-13 ENCOUNTER — Other Ambulatory Visit: Payer: Self-pay

## 2020-05-13 MED ORDER — TEMAZEPAM 15 MG PO CAPS: 15.0000 mg | ORAL_CAPSULE | Freq: Every evening | ORAL | 0 refills | Status: DC | PRN

## 2020-05-13 MED ORDER — HYDROCODONE-ACETAMINOPHEN 7.5-325 MG PO TABS: 1.0000 | ORAL_TABLET | Freq: Two times a day (BID) | ORAL | 0 refills | Status: DC | PRN

## 2020-05-13 MED ORDER — METHYLPHENIDATE HCL 10 MG PO TABS: 10.0000 mg | ORAL_TABLET | Freq: Two times a day (BID) | ORAL | 0 refills | Status: DC

## 2020-05-13 NOTE — Telephone Encounter (Signed)
Name of Medication:Hydrocodone Name of Pharmacy:[facility] [place] Last Written Date and Quantity:04/13/20#120 Last Office Visit and Type:05-02-20 Next Office Visit and Type:08/02/20 Last Controlled Substance Agreement Date:10-29-19 Last UDS:11-21-17  Temazepam last written7/7/21#30  Methylphenidate last written 04/13/20 #60

## 2020-06-15 ENCOUNTER — Other Ambulatory Visit: Payer: Self-pay

## 2020-06-15 MED ORDER — TEMAZEPAM 15 MG PO CAPS: 15.0000 mg | ORAL_CAPSULE | Freq: Every evening | ORAL | 0 refills | Status: DC | PRN

## 2020-06-15 MED ORDER — METHYLPHENIDATE HCL 10 MG PO TABS: 10.0000 mg | ORAL_TABLET | Freq: Two times a day (BID) | ORAL | 0 refills | Status: DC

## 2020-06-15 MED ORDER — HYDROCODONE-ACETAMINOPHEN 7.5-325 MG PO TABS: 1.0000 | ORAL_TABLET | Freq: Two times a day (BID) | ORAL | 0 refills | Status: DC | PRN

## 2020-06-15 NOTE — Telephone Encounter (Signed)
Name of Medication:Hydrocodone Name of Pharmacy:[facility] [place] Last Written Date and Quantity:05/13/20#120 Last Office Visit and Type:05/13/20 Next Office Visit and Type:08/02/20 Last Controlled Substance Agreement Date:10-29-19 Last UDS:11-21-17  Temazepam last written8/6/21#30  Methylphenidate last written8/6/21 #60

## 2020-07-11 ENCOUNTER — Other Ambulatory Visit: Payer: Self-pay | Admitting: Internal Medicine

## 2020-07-15 ENCOUNTER — Other Ambulatory Visit: Payer: Self-pay

## 2020-07-15 MED ORDER — METHYLPHENIDATE HCL 10 MG PO TABS: 10.0000 mg | ORAL_TABLET | Freq: Two times a day (BID) | ORAL | 0 refills | Status: DC

## 2020-07-15 MED ORDER — TEMAZEPAM 15 MG PO CAPS: 15.0000 mg | ORAL_CAPSULE | Freq: Every evening | ORAL | 0 refills | Status: DC | PRN

## 2020-07-15 MED ORDER — HYDROCODONE-ACETAMINOPHEN 7.5-325 MG PO TABS: 1.0000 | ORAL_TABLET | Freq: Two times a day (BID) | ORAL | 0 refills | Status: DC | PRN

## 2020-07-15 NOTE — Telephone Encounter (Signed)
Name of Medication:Hydrocodone Name of Pharmacy:[facility] [place] Last Written Date and Quantity:06/15/20#120 Last Office Visit and Type:05/13/20 Next Office Visit and Type:08/02/20 Last Controlled Substance Agreement Date:10-29-19 Last UDS:05/02/20  Temazepam last written9/8/21#30  Methylphenidate last written9/8/21 #60

## 2020-08-02 ENCOUNTER — Telehealth: Payer: Medicare (Managed Care) | Admitting: Internal Medicine

## 2020-08-14 ENCOUNTER — Other Ambulatory Visit: Payer: Self-pay

## 2020-08-15 MED ORDER — TEMAZEPAM 15 MG PO CAPS: 15.0000 mg | ORAL_CAPSULE | Freq: Every evening | ORAL | 0 refills | Status: DC | PRN

## 2020-08-15 MED ORDER — METHYLPHENIDATE HCL 10 MG PO TABS: 10.0000 mg | ORAL_TABLET | Freq: Two times a day (BID) | ORAL | 0 refills | Status: DC

## 2020-08-15 MED ORDER — HYDROCODONE-ACETAMINOPHEN 7.5-325 MG PO TABS: 1.0000 | ORAL_TABLET | Freq: Two times a day (BID) | ORAL | 0 refills | Status: DC | PRN

## 2020-08-15 NOTE — Telephone Encounter (Signed)
Pharmacy requests refill on: Methylphenidate 10 mg  LAST REFILL: 07/15/2020 LAST OV: 05/02/2020 NEXT OV: 10/26/2020 PHARMACY: [id number] [place], [place]   Pharmacy requests refill on: Hydrocodone-acetaminophen 7.5-325 mg   LAST REFILL: 07/15/2020 LAST OV: 05/02/2020 NEXT OV: 10/26/2020 PHARMACY: [id number] [place], [place]  Pharmacy requests refill on: Temazepam 15 mg  LAST REFILL: 07/15/2020  LAST OV: 05/02/2020 NEXT OV: 10/26/20 PHARMACY: [id number] [place], [place]  Prescription requests routed to Dr. Alphonsus Sias for approval per policy.

## 2020-09-12 ENCOUNTER — Other Ambulatory Visit: Payer: Self-pay

## 2020-09-12 MED ORDER — TEMAZEPAM 15 MG PO CAPS
15.0000 mg | ORAL_CAPSULE | Freq: Every evening | ORAL | 0 refills | Status: DC | PRN
Start: 2020-09-12 — End: 2020-10-14

## 2020-09-12 MED ORDER — HYDROCODONE-ACETAMINOPHEN 7.5-325 MG PO TABS
1.0000 | ORAL_TABLET | Freq: Two times a day (BID) | ORAL | 0 refills | Status: DC | PRN
Start: 2020-09-12 — End: 2020-10-14

## 2020-09-12 MED ORDER — METHYLPHENIDATE HCL 10 MG PO TABS
10.0000 mg | ORAL_TABLET | Freq: Two times a day (BID) | ORAL | 0 refills | Status: DC
Start: 2020-09-12 — End: 2020-10-14

## 2020-09-12 NOTE — Telephone Encounter (Signed)
Name of Medication:Hydrocodone Name of Pharmacy:[facility] [place] Last Written Date and Quantity:08/15/20#120 Last Office Visit and Type:05/02/20 Next Office Visit and Type:10/26/20 Last Controlled Substance Agreement Date:10-29-19 Last UDS:05/02/20  Temazepam last written11/8/21#30  Methylphenidate last written11/8/21 #60

## 2020-10-13 ENCOUNTER — Other Ambulatory Visit: Payer: Self-pay

## 2020-10-13 NOTE — Telephone Encounter (Signed)
Please postpone the 1/19 visit and reschedule for February or March

## 2020-10-14 ENCOUNTER — Telehealth: Payer: Self-pay

## 2020-10-14 MED ORDER — TEMAZEPAM 15 MG PO CAPS
15.0000 mg | ORAL_CAPSULE | Freq: Every evening | ORAL | 0 refills | Status: DC | PRN
Start: 2020-10-14 — End: 2020-12-13

## 2020-10-14 MED ORDER — METHYLPHENIDATE HCL 10 MG PO TABS
10.0000 mg | ORAL_TABLET | Freq: Two times a day (BID) | ORAL | 0 refills | Status: DC
Start: 2020-10-14 — End: 2020-12-13

## 2020-10-14 MED ORDER — HYDROCODONE-ACETAMINOPHEN 7.5-325 MG PO TABS
1.0000 | ORAL_TABLET | Freq: Two times a day (BID) | ORAL | 0 refills | Status: DC | PRN
Start: 2020-10-14 — End: 2021-01-10

## 2020-10-14 NOTE — Telephone Encounter (Signed)
Dr. Letvak - please advise on refill. Thank you.  

## 2020-10-14 NOTE — Telephone Encounter (Signed)
Yes sir, I have refused them but I can't sign the orders due to some of the medications being controlled. You just need to sign the order. Thank you!

## 2020-10-14 NOTE — Telephone Encounter (Signed)
Pt called in due to she is switching pharmacies and due to its a controlled medication they need a new prescription. And she is out of the medication   New pharmacy is: [facility] [address] , [place] , [place] [address] Phone number : [phone]

## 2020-10-14 NOTE — Telephone Encounter (Signed)
This is a duplicate Not sure how to get rid of it (refuse due to duplicate?)

## 2020-10-17 ENCOUNTER — Other Ambulatory Visit: Payer: Self-pay | Admitting: *Deleted

## 2020-10-17 MED ORDER — BUSPIRONE HCL 10 MG PO TABS
10.0000 mg | ORAL_TABLET | Freq: Two times a day (BID) | ORAL | 0 refills | Status: DC
Start: 2020-10-17 — End: 2021-05-15

## 2020-10-17 MED ORDER — METHYLPHENIDATE HCL 10 MG PO TABS
10.0000 mg | ORAL_TABLET | Freq: Two times a day (BID) | ORAL | 0 refills | Status: DC
Start: 2020-10-17 — End: 2020-11-16

## 2020-10-17 MED ORDER — HYDROCODONE-ACETAMINOPHEN 7.5-325 MG PO TABS
1.0000 | ORAL_TABLET | Freq: Two times a day (BID) | ORAL | 0 refills | Status: DC | PRN
Start: 2020-10-17 — End: 2020-11-16

## 2020-10-17 MED ORDER — TEMAZEPAM 15 MG PO CAPS
15.0000 mg | ORAL_CAPSULE | Freq: Every evening | ORAL | 0 refills | Status: DC | PRN
Start: 2020-10-17 — End: 2020-11-16

## 2020-10-17 NOTE — Telephone Encounter (Signed)
Please cancel the refills at the other pharmacy

## 2020-10-17 NOTE — Telephone Encounter (Signed)
Patient called back stating that she does not need refills on her control substances at this time. Patient stated that she will send a request thru mychart when refills are needed. Patient stated that she wants her pharmacy changed in the system to her new pharmacy.

## 2020-10-17 NOTE — Telephone Encounter (Signed)
[facility] to [facility] -[address] [place], [place]. Patient was able to pick up Temazepam, but they wouldn't give her the other 2 medications.  [facility] will have the other 2 medications ready for her to pick up today.  All medications forward please send to [employer].

## 2020-10-26 ENCOUNTER — Ambulatory Visit: Payer: Medicare (Managed Care) | Admitting: Internal Medicine

## 2020-11-16 ENCOUNTER — Other Ambulatory Visit: Payer: Self-pay

## 2020-11-16 MED ORDER — TEMAZEPAM 15 MG PO CAPS: 15.0000 mg | ORAL_CAPSULE | Freq: Every evening | ORAL | 0 refills | Status: DC | PRN

## 2020-11-16 MED ORDER — METHYLPHENIDATE HCL 10 MG PO TABS
10.0000 mg | ORAL_TABLET | Freq: Two times a day (BID) | ORAL | 0 refills | Status: DC
Start: 2020-11-16 — End: 2020-12-13

## 2020-11-16 MED ORDER — HYDROCODONE-ACETAMINOPHEN 7.5-325 MG PO TABS: 1.0000 | ORAL_TABLET | Freq: Two times a day (BID) | ORAL | 0 refills | Status: DC | PRN

## 2020-11-16 NOTE — Telephone Encounter (Signed)
Name of Medication:Hydrocodone Name of Pharmacy:[facility]  [place] Last Written Date and Quantity:10-14-20#120 Last Office Visit and Type:05/02/20 Next Office Visit and Type:01/16/21 Last Controlled Substance Agreement Date:10-29-19 Last UDS:05/02/20  Temazepam last written1-7-22#30  Methylphenidate last written1-7-22 #60

## 2020-11-28 ENCOUNTER — Telehealth: Payer: Self-pay

## 2020-11-28 NOTE — Telephone Encounter (Signed)
[facility] sent a letter that her temazepam is over the quantity limit of 30 per 30 days. Dr Alphonsus Sias asked that I advise pt she may have to pay out of pocket for the additional 30. I did speak to her and is fine with that. I did get on CoverMyMeds and do a quantity override. Waiting for response.

## 2020-12-13 ENCOUNTER — Other Ambulatory Visit: Payer: Self-pay

## 2020-12-13 NOTE — Telephone Encounter (Signed)
Name of Medication:Hydrocodone Name of Pharmacy:[facility]  [place] Last Written Date and Quantity:11-16-20#120 Last Office Visit and Type:05/02/20 Next Office Visit and Type:01/16/21 Last Controlled Substance Agreement Date:10-29-19 Last UDS:05/02/20  Temazepam last written2-9-22#30  Methylphenidate last written2-9-22 #60

## 2020-12-14 MED ORDER — TEMAZEPAM 15 MG PO CAPS: 15.0000 mg | ORAL_CAPSULE | Freq: Every evening | ORAL | 0 refills | Status: DC | PRN

## 2020-12-14 MED ORDER — HYDROCODONE-ACETAMINOPHEN 7.5-325 MG PO TABS: 1.0000 | ORAL_TABLET | Freq: Two times a day (BID) | ORAL | 0 refills | Status: DC | PRN

## 2020-12-14 MED ORDER — METHYLPHENIDATE HCL 10 MG PO TABS: 10.0000 mg | ORAL_TABLET | Freq: Two times a day (BID) | ORAL | 0 refills | Status: DC

## 2021-01-10 ENCOUNTER — Other Ambulatory Visit: Payer: Self-pay

## 2021-01-10 MED ORDER — TEMAZEPAM 15 MG PO CAPS: 15.0000 mg | ORAL_CAPSULE | Freq: Every evening | ORAL | 0 refills | Status: DC | PRN

## 2021-01-10 MED ORDER — METHYLPHENIDATE HCL 10 MG PO TABS: 10.0000 mg | ORAL_TABLET | Freq: Two times a day (BID) | ORAL | 0 refills | Status: DC

## 2021-01-10 MED ORDER — HYDROCODONE-ACETAMINOPHEN 7.5-325 MG PO TABS: 1.0000 | ORAL_TABLET | Freq: Two times a day (BID) | ORAL | 0 refills | Status: DC | PRN

## 2021-01-10 NOTE — Telephone Encounter (Signed)
Name of Medication:Hydrocodone [facility] [place] Last Written Date and Quantity:12-14-20#120 Last Office Visit and Type:05/02/20 Next Office Visit and Type:01/16/21 Last Controlled Substance Agreement Date:10-29-19 Last UDS:05/02/20  Temazepam last written3-9-22#30  Methylphenidate last written3-9-22#60

## 2021-01-10 NOTE — Telephone Encounter (Signed)
Note from pt in another MyChart Message: I have ordered refills for medications a few days early. I will not be picking them up from the pharmacy until the 9th. I just wanted to make sure the pharmacy has plenty of time to make sure they have all meds in stock. I am leaving here on Mon. the 10th, for my appt. at your office on the 11th, and will be there for a week. The alternative would be to have them filled up there. Whichever you think best.  Thanks, Rachel Vaughn

## 2021-01-12 ENCOUNTER — Ambulatory Visit: Payer: Medicare (Managed Care) | Admitting: Internal Medicine

## 2021-01-13 ENCOUNTER — Other Ambulatory Visit: Payer: Self-pay | Admitting: Internal Medicine

## 2021-01-16 ENCOUNTER — Encounter: Payer: Self-pay | Admitting: Internal Medicine

## 2021-01-16 ENCOUNTER — Other Ambulatory Visit: Payer: Self-pay

## 2021-01-16 ENCOUNTER — Ambulatory Visit (INDEPENDENT_AMBULATORY_CARE_PROVIDER_SITE_OTHER): Payer: Medicare (Managed Care) | Admitting: Internal Medicine

## 2021-01-16 VITALS — BP 108/70 | HR 68 | Temp 97.4°F | Ht 65.0 in | Wt 131.0 lb

## 2021-01-16 DIAGNOSIS — G8929 Other chronic pain: Secondary | ICD-10-CM

## 2021-01-16 DIAGNOSIS — F411 Generalized anxiety disorder: Secondary | ICD-10-CM | POA: Diagnosis not present

## 2021-01-16 DIAGNOSIS — K219 Gastro-esophageal reflux disease without esophagitis: Secondary | ICD-10-CM | POA: Diagnosis not present

## 2021-01-16 DIAGNOSIS — M545 Low back pain, unspecified: Secondary | ICD-10-CM

## 2021-01-16 DIAGNOSIS — F1120 Opioid dependence, uncomplicated: Secondary | ICD-10-CM | POA: Diagnosis not present

## 2021-01-16 NOTE — Progress Notes (Signed)
Subjective:    Patient ID: Rachel Vaughn, female    DOB: [birth year], 60 y.o.   MRN: [id number]  HPI Here for follow up of chronic back pain and anxiety This visit occurred during the SARS-CoV-2 public health emergency.  Safety protocols were in place, including screening questions prior to the visit, additional usage of staff PPE, and extensive cleaning of exam room while observing appropriate contact time as indicated for disinfecting solutions.   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--but not quite ready Still gets thrown for a loop with any variations to a plan ---like her neighbors were going to watch her dog and then they backed out (and "I went into a spiral") Rarely shops---stocks up when she goes out [employer] out weekly for perishables (or sister can help her out since she lives nearby)  Long time therapist Dr Alycia 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 busPIRone (BUSPAR) 10 MG tablet Take 1 tablet (10 mg total) by mouth 2 (two) times daily. 180 tablet 0  . cetirizine (ZYRTEC) 10 MG tablet Take 10 mg by mouth daily.    . citalopram (CELEXA) 20 MG tablet TAKE ONE TABLET BY MOUTH ONE TIME DAILY 90 tablet 3  . diclofenac sodium (VOLTAREN) 1 % GEL Apply 2 g topically 4 (four) times daily as needed. To hands 200 g 11  . fluticasone (FLONASE) 50 MCG/ACT nasal spray Place 2 sprays into both nostrils daily.    Marland Kitchen HYDROcodone-acetaminophen (NORCO) 7.5-325 MG tablet Take 1-2 tablets  by mouth 2 (two) times daily as needed. 120 tablet 0  . hydrocortisone 2.5 % cream Apply topically 3 (three) times daily as needed. 28 g 3  . methylphenidate (RITALIN) 10 MG tablet Take 1 tablet (10 mg total) by mouth 2 (two) times daily. 60 tablet 0  . omeprazole (PRILOSEC) 20 MG capsule TAKE 1 CAPSULE BY MOUTH 2 TIMES A DAY BEFORE MEALS 60 capsule 11  . temazepam (RESTORIL) 15 MG capsule Take 1-2 capsules (15-30 mg total) by mouth at bedtime as needed for sleep. 60 capsule 0  . tretinoin (RETIN-A) 0.01 % gel Apply topically at bedtime. 45 g 0  . triamcinolone cream (KENALOG) 0.1 % apply to affected area twice a day AS NEEDED 30 g 2  . valACYclovir (VALTREX) 1000 MG tablet Take 1 tablet (1,000 mg total) by mouth 2 (two) times daily as needed. 30 tablet 1  . VENTOLIN HFA 108 (90 Base) MCG/ACT inhaler INHALE 2 PUFFS BY MOUTH EVERY 6 HOURS IF NEEDED FOR WHEEZING OR SHORTNESS OF BREATH. USE WITH SPACER 18 g 2   No current facility-administered medications on file prior to visit.    Allergies  Allergen Reactions  . Fluoxetine Hcl     REACTION: unspecified  . Paroxetine     REACTION: unspecified  . Poison Ivy Extract   . Venlafaxine     REACTION: unspecified    Past Medical History:  Diagnosis Date  . Anxiety   .  Asthma   . Diverticulitis   . GERD (gastroesophageal reflux disease)   . Social anxiety disorder     Past Surgical History:  Procedure Laterality Date  . BREAST ENHANCEMENT SURGERY  1987  . CESAREAN SECTION      Family History  Problem Relation Age of Onset  . Asthma Mother   . COPD Mother   . Diabetes Maternal Grandmother   . Cancer Neg Hx     Social History   Socioeconomic History  . Marital status: Divorced    Spouse name: Not on file  . Number of children: 2  . Years of education: Not on file  . Highest education level: Not on file  Occupational History  . Occupation: Disabled ---anxiety  Tobacco Use  . Smoking status: Never Smoker  . Smokeless  tobacco: Never Used  Substance and Sexual Activity  . Alcohol use: Yes  . Drug use: No  . Sexual activity: Not on file  Other Topics Concern  . Not on file  Social History Narrative   No living will   Would want children to make decisions   Would accept resuscitation   Not sure about tube feeds    Social Determinants of Health   Financial Resource Strain: Not on file  Food Insecurity: Not on file  Transportation Needs: Not on file  Physical Activity: Not on file  Stress: Not on file  Social Connections: Not on file  Intimate Partner Violence: Not on file   Review of Systems No sig pain at the hernia Appetite is good Weight up slightly    Objective:   Physical Exam Constitutional:      Appearance: Normal appearance.  Neurological:     Mental Status: She is alert.  Psychiatric:     Comments: Slight pressured speech and some anxiety --mostly that she voices. Calm here for the most part Not depressed            Assessment & Plan:

## 2021-01-16 NOTE — Assessment & Plan Note (Signed)
Will recheck UDS

## 2021-01-16 NOTE — Patient Instructions (Signed)
Please set up your screening mammogram. 

## 2021-01-16 NOTE — Assessment & Plan Note (Signed)
Has had some worsening in function Is able to maintain her independence with the hydrocodone

## 2021-01-16 NOTE — Assessment & Plan Note (Signed)
Doing okay on the citalopram and temazepam (not just for sleep)

## 2021-01-16 NOTE — Assessment & Plan Note (Signed)
Still needs the omeprazole bid----got night chest pains after trying to cut back again

## 2021-01-19 LAB — DRUG MONITORING, PANEL 8 WITH CONFIRMATION, URINE
6 Acetylmorphine: NEGATIVE ng/mL (ref <10)
Alcohol Metabolites: POSITIVE ng/mL — AB
Alphahydroxyalprazolam: NEGATIVE ng/mL (ref <25)
Alphahydroxymidazolam: NEGATIVE ng/mL (ref <50)
Alphahydroxytriazolam: NEGATIVE ng/mL (ref <50)
Aminoclonazepam: NEGATIVE ng/mL (ref <25)
Amphetamines: NEGATIVE ng/mL (ref <500)
Benzodiazepines: POSITIVE ng/mL — AB (ref <100)
Buprenorphine, Urine: NEGATIVE ng/mL (ref <5)
Cocaine Metabolite: NEGATIVE ng/mL (ref <150)
Codeine: NEGATIVE ng/mL (ref <50)
Creatinine: 179.2 mg/dL
Ethyl Glucuronide (ETG): 2552 ng/mL — ABNORMAL HIGH (ref <500)
Ethyl Sulfate (ETS): 1245 ng/mL — ABNORMAL HIGH (ref <100)
Hydrocodone: 4345 ng/mL — ABNORMAL HIGH (ref <50)
Hydromorphone: 115 ng/mL — ABNORMAL HIGH (ref <50)
Hydroxyethylflurazepam: NEGATIVE ng/mL (ref <50)
Lorazepam: NEGATIVE ng/mL (ref <50)
MDMA: NEGATIVE ng/mL (ref <500)
Marijuana Metabolite: NEGATIVE ng/mL (ref <20)
Morphine: NEGATIVE ng/mL (ref <50)
Nordiazepam: NEGATIVE ng/mL (ref <50)
Norhydrocodone: 8106 ng/mL — ABNORMAL HIGH (ref <50)
Opiates: POSITIVE ng/mL — AB (ref <100)
Oxazepam: 1826 ng/mL — ABNORMAL HIGH (ref <50)
Oxidant: NEGATIVE ug/mL
Oxycodone: NEGATIVE ng/mL (ref <100)
Temazepam: >8000 ng/mL — ABNORMAL HIGH (ref <50)
pH: 5 (ref 4.5–9.0)

## 2021-01-19 LAB — DM TEMPLATE

## 2021-02-08 MED ORDER — TIZANIDINE HCL 2 MG PO TABS: 2.0000 mg | ORAL_TABLET | Freq: Four times a day (QID) | ORAL | 1 refills | Status: DC | PRN

## 2021-02-13 ENCOUNTER — Other Ambulatory Visit: Payer: Self-pay

## 2021-02-13 MED ORDER — TEMAZEPAM 15 MG PO CAPS: 15.0000 mg | ORAL_CAPSULE | Freq: Every evening | ORAL | 0 refills | Status: DC | PRN

## 2021-02-13 MED ORDER — METHYLPHENIDATE HCL 10 MG PO TABS: 10.0000 mg | ORAL_TABLET | Freq: Two times a day (BID) | ORAL | 0 refills | Status: DC

## 2021-02-13 MED ORDER — HYDROCODONE-ACETAMINOPHEN 7.5-325 MG PO TABS: 1.0000 | ORAL_TABLET | Freq: Two times a day (BID) | ORAL | 0 refills | Status: DC | PRN

## 2021-02-13 NOTE — Telephone Encounter (Signed)
Name of Medication:Hydrocodone [facility] [place] Last Written Date and Quantity:01-10-21#120 Last Office Visit and Type:01-16-21 Next Office Visit and Type:07-24-21 Last Controlled Substance Agreement Date:01-16-21 Last UDS:01-16-21  Temazepam last written4-5-22#30  Methylphenidate last written4-5-22#60

## 2021-03-15 ENCOUNTER — Other Ambulatory Visit: Payer: Self-pay

## 2021-03-15 MED ORDER — HYDROCODONE-ACETAMINOPHEN 7.5-325 MG PO TABS: 1.0000 | ORAL_TABLET | Freq: Two times a day (BID) | ORAL | 0 refills | Status: DC | PRN

## 2021-03-15 MED ORDER — TEMAZEPAM 15 MG PO CAPS: 15.0000 mg | ORAL_CAPSULE | Freq: Every evening | ORAL | 0 refills | Status: DC | PRN

## 2021-03-15 MED ORDER — METHYLPHENIDATE HCL 10 MG PO TABS: 10.0000 mg | ORAL_TABLET | Freq: Two times a day (BID) | ORAL | 0 refills | Status: DC

## 2021-03-15 NOTE — Telephone Encounter (Signed)
Name of Medication:Hydrocodone [facility] [place] Last Written Date and Quantity:02-13-21#120 Last Office Visit and Type:01-16-21 Next Office Visit and Type:07-24-21 Last Controlled Substance Agreement Date:01-16-21 Last UDS:01-16-21  Temazepam last written5-9-22#30  Methylphenidate last written5-9-22#60

## 2021-03-23 ENCOUNTER — Telehealth: Payer: Self-pay | Admitting: *Deleted

## 2021-03-23 NOTE — Telephone Encounter (Signed)
Pt called billing due a bill she received for the labs (drug monitoring) from 01/16/21. She said she isn't suppose to get a bill. Pt called our billing dpt and they told her to f/u with the front desk manager regarding this issues.  I did give pt the direct # for the [facility] Billing for (coding problems) but she still wants the manager to f/u with her from her. I did advise her Irving Burton isn't in the office and pt is okay waiting until she returns for a call back

## 2021-03-30 NOTE — Telephone Encounter (Signed)
Called patient to discuss. It looks like she has a copay this year of $40 and that is what she was billed for. I advised patient of this and she  stated she called the insurance and they didn't have Dr. Alphonsus Sias listed as her PCP, so that is why it showed she owed a copay. They are working on fixing that in the system. Pt asked if she needed to go ahead and pay the bill and wait for insurance to fix the issue to see if it changes the copay due. I advised her to call billing to discuss options as I am not sure how quickly bills are sent to collections. Pt agreed.

## 2021-04-07 ENCOUNTER — Telehealth: Payer: Self-pay | Admitting: Internal Medicine

## 2021-04-07 DIAGNOSIS — R109 Unspecified abdominal pain: Secondary | ICD-10-CM

## 2021-04-07 NOTE — Telephone Encounter (Signed)
Patient call in request ing a referral for gastro and will like a call back [phone]

## 2021-04-11 NOTE — Telephone Encounter (Signed)
See MyChart message

## 2021-04-14 ENCOUNTER — Other Ambulatory Visit: Payer: Self-pay

## 2021-04-14 MED ORDER — TEMAZEPAM 15 MG PO CAPS: 15.0000 mg | ORAL_CAPSULE | Freq: Every evening | ORAL | 0 refills | Status: DC | PRN

## 2021-04-14 MED ORDER — HYDROCODONE-ACETAMINOPHEN 7.5-325 MG PO TABS: 1.0000 | ORAL_TABLET | Freq: Two times a day (BID) | ORAL | 0 refills | Status: DC | PRN

## 2021-04-14 NOTE — Telephone Encounter (Signed)
Patient called in asking for refills on Temazepam and Hydrocodone.  Name of Medication: Hydrocodone  [facility] of [facility]  Last Written Date and Quantity: 03-15-21 #120   Last Office Visit and Type: 01-16-21  Next Office Visit and Type: 07-24-21  Last Controlled Substance Agreement Date: 01-16-21  Last UDS: 01-16-21  Last time Temazepam was filled on 03/15/21 #60 0 refill

## 2021-05-15 ENCOUNTER — Other Ambulatory Visit: Payer: Self-pay

## 2021-05-15 MED ORDER — TEMAZEPAM 15 MG PO CAPS: 15.0000 mg | ORAL_CAPSULE | Freq: Every evening | ORAL | 0 refills | Status: DC | PRN

## 2021-05-15 MED ORDER — BUSPIRONE HCL 10 MG PO TABS: 10.0000 mg | ORAL_TABLET | Freq: Two times a day (BID) | ORAL | 3 refills | Status: DC

## 2021-05-15 MED ORDER — HYDROCODONE-ACETAMINOPHEN 7.5-325 MG PO TABS: 1.0000 | ORAL_TABLET | Freq: Two times a day (BID) | ORAL | 0 refills | Status: DC | PRN

## 2021-05-15 NOTE — Telephone Encounter (Signed)
Name of Medication: Hydrocodone  Name of Pharmacy: [facility]  Last Written Date and Quantity: 04-14-21 #120   Last Office Visit and Type: 01-16-21  Next Office Visit and Type: 07-24-21  Last Controlled Substance Agreement Date: 01-16-21  Last UDS: 01-16-21   Last time Temazepam was filled on 04/14/21 #60 0 refill

## 2021-06-14 ENCOUNTER — Other Ambulatory Visit: Payer: Self-pay

## 2021-06-14 MED ORDER — HYDROCODONE-ACETAMINOPHEN 7.5-325 MG PO TABS: 1.0000 | ORAL_TABLET | Freq: Two times a day (BID) | ORAL | 0 refills | Status: DC | PRN

## 2021-06-14 MED ORDER — TEMAZEPAM 15 MG PO CAPS: 15.0000 mg | ORAL_CAPSULE | Freq: Every evening | ORAL | 0 refills | Status: DC | PRN

## 2021-06-14 NOTE — Telephone Encounter (Signed)
Name of Medication: Hydrocodone  Name of Pharmacy: [facility]  Last Written Date and Quantity: 05-15-21 #120   Last Office Visit and Type: 01-16-21  Next Office Visit and Type: 07-24-21  Last Controlled Substance Agreement Date: 01-16-21  Last UDS: 01-16-21   Last time Temazepam was filled on 05/15/21 #60 0 refill

## 2021-07-12 ENCOUNTER — Other Ambulatory Visit: Payer: Self-pay

## 2021-07-12 MED ORDER — HYDROCODONE-ACETAMINOPHEN 7.5-325 MG PO TABS: 1.0000 | ORAL_TABLET | Freq: Two times a day (BID) | ORAL | 0 refills | Status: DC | PRN

## 2021-07-12 MED ORDER — TEMAZEPAM 15 MG PO CAPS: 15.0000 mg | ORAL_CAPSULE | Freq: Every evening | ORAL | 0 refills | Status: DC | PRN

## 2021-07-12 NOTE — Telephone Encounter (Signed)
Name of Medication: Hydrocodone  Name of Pharmacy: [facility]  Last Written Date and Quantity: 06-14-21 #120   Last Office Visit and Type: 01-16-21  Next Office Visit and Type: 07-24-21  Last Controlled Substance Agreement Date: 01-16-21  Last UDS: 01-16-21   Last time Temazepam was filled on 06/14/21 #60 0 refill

## 2021-07-15 ENCOUNTER — Other Ambulatory Visit: Payer: Self-pay | Admitting: Internal Medicine

## 2021-07-17 MED ORDER — TIZANIDINE HCL 2 MG PO TABS: 2.0000 mg | ORAL_TABLET | Freq: Four times a day (QID) | ORAL | 1 refills | Status: DC | PRN

## 2021-07-17 NOTE — Addendum Note (Signed)
Addended by: Tillman Abide I on: 07/17/2021 04:27 PM   Modules accepted: Orders

## 2021-07-24 ENCOUNTER — Encounter: Payer: Medicare (Managed Care) | Admitting: Internal Medicine

## 2021-08-18 ENCOUNTER — Other Ambulatory Visit: Payer: Self-pay | Admitting: Internal Medicine

## 2021-08-18 MED ORDER — TEMAZEPAM 15 MG PO CAPS: 15.0000 mg | ORAL_CAPSULE | Freq: Every evening | ORAL | 0 refills | Status: DC | PRN

## 2021-08-18 MED ORDER — HYDROCODONE-ACETAMINOPHEN 7.5-325 MG PO TABS: 1.0000 | ORAL_TABLET | Freq: Two times a day (BID) | ORAL | 0 refills | Status: DC | PRN

## 2021-08-18 NOTE — Telephone Encounter (Signed)
Note Name of Medication: Hydrocodone  Name of Pharmacy:  PHARMACY:[id number] [place], [place] - [address]    Last Written Date and Quantity: 07-12-21 #120   Last Office Visit and Type: 01-16-21  Next Office Visit and Type: 07-24-21  Last Controlled Substance Agreement Date: 01-16-21  Last UDS: 01-16-21   Last time Temazepam was filled on 07/12/21 #60 0 refill

## 2021-08-18 NOTE — Telephone Encounter (Signed)
Since she is back in [place]---we should have her in the office soon. Actually due for a physical

## 2021-08-18 NOTE — Telephone Encounter (Signed)
  Encourage patient to contact the pharmacy for refills or they can request refills through [facility]  LAST APPOINTMENT DATE:  Please schedule appointment if longer than 1 year  NEXT APPOINTMENT DATE:10/30/21  MEDICATION:temazepam (RESTORIL) 15 MG capsule,HYDROcodone-acetaminophen (NORCO) 7.5-325 MG tablet  Is the patient out of medication?   PHARMACY:[id number] - [place], [place] - [address] AT [facility] [address]  Let patient know to contact pharmacy at the end of the day to make sure medication is ready.  Please notify patient to allow 48-72 hours to process  CLINICAL FILLS OUT ALL BELOW:   LAST REFILL:  QTY:  REFILL DATE:    OTHER COMMENTS:    Okay for refill?  Please advise

## 2021-09-19 ENCOUNTER — Other Ambulatory Visit: Payer: Self-pay | Admitting: Internal Medicine

## 2021-09-19 MED ORDER — TEMAZEPAM 15 MG PO CAPS: 15.0000 mg | ORAL_CAPSULE | Freq: Every evening | ORAL | 0 refills | Status: DC | PRN

## 2021-09-19 MED ORDER — HYDROCODONE-ACETAMINOPHEN 7.5-325 MG PO TABS: 1.0000 | ORAL_TABLET | Freq: Two times a day (BID) | ORAL | 0 refills | Status: DC | PRN

## 2021-09-19 NOTE — Telephone Encounter (Signed)
Name of Medication: Hydrocodone  Name of Pharmacy:  PHARMACY:[id number] [place], [place] - [address]    Last Written Date and Quantity: 08-18-21 #120   Last Office Visit and Type: 01-16-21  Next Office Visit and Type: 10-30-21  Last Controlled Substance Agreement Date: 01-16-21  Last UDS: 01-16-21   Last time Temazepam was filled on 08/18/21 #60 0 refill

## 2021-10-18 ENCOUNTER — Other Ambulatory Visit: Payer: Self-pay | Admitting: Internal Medicine

## 2021-10-18 MED ORDER — TEMAZEPAM 15 MG PO CAPS: 15.0000 mg | ORAL_CAPSULE | Freq: Every evening | ORAL | 0 refills | Status: DC | PRN

## 2021-10-18 MED ORDER — HYDROCODONE-ACETAMINOPHEN 7.5-325 MG PO TABS: 1.0000 | ORAL_TABLET | Freq: Two times a day (BID) | ORAL | 0 refills | Status: DC | PRN

## 2021-10-18 NOTE — Telephone Encounter (Signed)
Name of Medication: Hydrocodone  Name of Pharmacy:  PHARMACY:[id number] [place], [place] - [address]    Last Written Date and Quantity: 09-19-21 #120   Last Office Visit and Type: 01-16-21  Next Office Visit and Type: 10-30-21  Last Controlled Substance Agreement Date: 01-16-21  Last UDS: 01-16-21   Last time Temazepam was filled on 09/19/21 #60 0 refill

## 2021-10-23 ENCOUNTER — Telehealth: Payer: Self-pay

## 2021-10-23 MED ORDER — HYDROCODONE-ACETAMINOPHEN 7.5-325 MG PO TABS: 1.0000 | ORAL_TABLET | Freq: Two times a day (BID) | ORAL | 0 refills | Status: DC | PRN

## 2021-10-23 NOTE — Telephone Encounter (Signed)
And wanted to know if the PA can be started due to she has a new insurance and they will not fill it without it.

## 2021-10-23 NOTE — Telephone Encounter (Signed)
Completed PA on CoverMyMeds. Waiting for response. Can take up to 72 hours.

## 2021-10-23 NOTE — Telephone Encounter (Signed)
I will work on the PA this afternoon

## 2021-10-23 NOTE — Addendum Note (Signed)
Addended by: Eual Fines on: 10/23/2021 03:04 PM   Modules accepted: Orders

## 2021-10-23 NOTE — Addendum Note (Signed)
Addended by: Tillman Abide I on: 10/23/2021 04:57 PM   Modules accepted: Orders

## 2021-10-23 NOTE — Telephone Encounter (Signed)
Mrs. Gagen called in and wanted to know about getting the medication changed to the [facility]- [address] [place], [place], [place] [address] [phone]

## 2021-10-24 NOTE — Telephone Encounter (Signed)
Approved until 10-23-22.

## 2021-10-30 ENCOUNTER — Other Ambulatory Visit (HOSPITAL_COMMUNITY)
Admission: RE | Admit: 2021-10-30 | Discharge: 2021-10-30 | Disposition: A | Discharge status: Home / Self Care | Payer: Medicare (Managed Care) | Source: Ambulatory Visit | Attending: Internal Medicine | Admitting: Internal Medicine

## 2021-10-30 ENCOUNTER — Ambulatory Visit (INDEPENDENT_AMBULATORY_CARE_PROVIDER_SITE_OTHER): Payer: Medicare (Managed Care) | Admitting: Internal Medicine

## 2021-10-30 ENCOUNTER — Encounter: Payer: Self-pay | Admitting: Internal Medicine

## 2021-10-30 ENCOUNTER — Other Ambulatory Visit: Payer: Self-pay

## 2021-10-30 VITALS — BP 136/86 | HR 54 | Temp 97.6°F | Ht 65.0 in | Wt 132.0 lb

## 2021-10-30 DIAGNOSIS — Z1151 Encounter for screening for human papillomavirus (HPV): Secondary | ICD-10-CM | POA: Insufficient documentation

## 2021-10-30 DIAGNOSIS — F3341 Major depressive disorder, recurrent, in partial remission: Secondary | ICD-10-CM | POA: Diagnosis not present

## 2021-10-30 DIAGNOSIS — Z0000 Encounter for general adult medical examination without abnormal findings: Secondary | ICD-10-CM | POA: Diagnosis not present

## 2021-10-30 DIAGNOSIS — G8929 Other chronic pain: Secondary | ICD-10-CM

## 2021-10-30 DIAGNOSIS — F1320 Sedative, hypnotic or anxiolytic dependence, uncomplicated: Secondary | ICD-10-CM | POA: Diagnosis not present

## 2021-10-30 DIAGNOSIS — F1120 Opioid dependence, uncomplicated: Secondary | ICD-10-CM

## 2021-10-30 DIAGNOSIS — M545 Low back pain, unspecified: Secondary | ICD-10-CM

## 2021-10-30 DIAGNOSIS — F411 Generalized anxiety disorder: Secondary | ICD-10-CM | POA: Diagnosis not present

## 2021-10-30 DIAGNOSIS — Z01419 Encounter for gynecological examination (general) (routine) without abnormal findings: Secondary | ICD-10-CM | POA: Diagnosis not present

## 2021-10-30 MED ORDER — TEMAZEPAM 15 MG PO CAPS: 15.0000 mg | ORAL_CAPSULE | Freq: Every evening | ORAL | 0 refills | Status: DC | PRN
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
## 2021-10-30 NOTE — Assessment & Plan Note (Signed)
I have personally reviewed the Medicare Annual Wellness questionnaire and have noted 1. The patient's medical and social history 2. Their use of alcohol, tobacco or illicit drugs 3. Their current medications and supplements 4. The patient's functional ability including ADL's, fall risks, home safety risks and hearing or visual             impairment. 5. Diet and physical activities 6. Evidence for depression or mood disorders  The patients weight, height, BMI and visual acuity have been recorded in the chart I have made referrals, counseling and provided education to the patient based review of the above and I have provided the pt with a written personalized care plan for preventive services.  I have provided you with a copy of your personalized plan for preventive services. Please take the time to review along with your updated medication list.  Needs to work on more exercise and healthier eating Consider bivalent COVID Flu vaccine in the fall Colon normal in 2017--will need in 2027 Overdue for mammogram---she needs to set this up Pap due today

## 2021-10-30 NOTE — Assessment & Plan Note (Signed)
Does okay with the hydrocodone ---3-4 per day

## 2021-10-30 NOTE — Assessment & Plan Note (Signed)
Is now off the ritalin On citalopram 20mg  daily Also buspar 10 bid---for anxiety and depression

## 2021-10-30 NOTE — Assessment & Plan Note (Signed)
PDMP reviewed No concerns 

## 2021-10-30 NOTE — Addendum Note (Signed)
Addended by: Eual Fines on: 10/30/2021 05:23 PM   Modules accepted: Orders
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
## 2021-10-31 LAB — CBC
HCT: 39.6 % (ref 36.0–46.0)
Hemoglobin: 13.1 g/dL (ref 12.0–15.0)
MCHC: 33.2 g/dL (ref 30.0–36.0)
MCV: 96.2 fl (ref 78.0–100.0)
Platelets: 226 10*3/uL (ref 150.0–400.0)
RBC: 4.12 Mil/uL (ref 3.87–5.11)
RDW: 12.6 % (ref 11.5–15.5)
WBC: 6.7 10*3/uL (ref 4.0–10.5)

## 2021-10-31 LAB — COMPREHENSIVE METABOLIC PANEL
ALT: 13 U/L (ref 0–35)
AST: 18 U/L (ref 0–37)
Albumin: 4.3 g/dL (ref 3.5–5.2)
Alkaline Phosphatase: 64 U/L (ref 39–117)
BUN: 19 mg/dL (ref 6–23)
CO2: 29 mEq/L (ref 19–32)
Calcium: 9.5 mg/dL (ref 8.4–10.5)
Chloride: 100 mEq/L (ref 96–112)
Creatinine, Ser: 0.83 mg/dL (ref 0.40–1.20)
GFR: 75.89 mL/min (ref >60.00)
Glucose, Bld: 89 mg/dL (ref 70–99)
Potassium: 4.2 mEq/L (ref 3.5–5.1)
Sodium: 136 mEq/L (ref 135–145)
Total Bilirubin: 0.2 mg/dL (ref 0.2–1.2)
Total Protein: 7.1 g/dL (ref 6.0–8.3)

## 2021-10-31 LAB — T4, FREE: Free T4: 0.74 ng/dL (ref 0.60–1.60)

## 2021-11-01 LAB — CYTOLOGY - PAP
Comment: NEGATIVE
Diagnosis: NEGATIVE
High risk HPV: NEGATIVE

## 2021-11-28 ENCOUNTER — Other Ambulatory Visit: Payer: Self-pay

## 2021-11-28 ENCOUNTER — Ambulatory Visit (INDEPENDENT_AMBULATORY_CARE_PROVIDER_SITE_OTHER): Payer: Medicare (Managed Care) | Admitting: Psychologist

## 2021-11-28 DIAGNOSIS — F411 Generalized anxiety disorder: Secondary | ICD-10-CM
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
## 2021-11-28 NOTE — Progress Notes (Signed)
                Rachel Tukes, PsyD 

## 2021-11-28 NOTE — Plan of Care (Signed)

## 2021-11-29 ENCOUNTER — Other Ambulatory Visit: Payer: Self-pay | Admitting: Internal Medicine

## 2021-11-29 MED ORDER — TEMAZEPAM 15 MG PO CAPS: 15.0000 mg | ORAL_CAPSULE | Freq: Every evening | ORAL | 0 refills | Status: DC | PRN

## 2021-11-29 MED ORDER — HYDROCODONE-ACETAMINOPHEN 7.5-325 MG PO TABS: 1.0000 | ORAL_TABLET | Freq: Two times a day (BID) | ORAL | 0 refills | Status: DC | PRN

## 2021-11-29 NOTE — Telephone Encounter (Signed)
Name of Medication: Hydrocodone  Name of Pharmacy:  [id number] [place], [place] - [place]    Last Written Date and Quantity: 10-23-21 #120   Last Office Visit and Type: 10-30-21  Next Office Visit and Type: 01-30-22  Last Controlled Substance Agreement Date: 01-16-21  Last UDS: 01-16-21   Last time Temazepam was filled on  10-23-21 #60 0 refill

## 2021-12-07 ENCOUNTER — Ambulatory Visit: Payer: Medicare (Managed Care) | Admitting: Psychologist

## 2021-12-25 ENCOUNTER — Other Ambulatory Visit: Payer: Self-pay | Admitting: Internal Medicine

## 2021-12-25 MED ORDER — HYDROCODONE-ACETAMINOPHEN 7.5-325 MG PO TABS: 1.0000 | ORAL_TABLET | Freq: Two times a day (BID) | ORAL | 0 refills | Status: DC | PRN

## 2021-12-25 MED ORDER — TEMAZEPAM 15 MG PO CAPS: 15.0000 mg | ORAL_CAPSULE | Freq: Every evening | ORAL | 0 refills | Status: DC | PRN

## 2021-12-25 NOTE — Telephone Encounter (Signed)
Last OV 10/30/21. Next apt 01/30/22. ? ?Last refill of temazepam, take 1/2 capsules at bedtime PRN, #60, no RF- 11/29/21 ? ?Last refill of Norco, take 1/2 tablets BID PRN, # 120, no RF- 11/29/21 ?

## 2021-12-28 ENCOUNTER — Telehealth: Payer: Self-pay | Admitting: Internal Medicine

## 2021-12-28 NOTE — Telephone Encounter (Signed)
Pt called in stated she needs to change Pharmacy per/ insurance to [facility] on [address] in [place] . ?

## 2021-12-28 NOTE — Telephone Encounter (Signed)
Changed pharmacy

## 2022-01-02 ENCOUNTER — Encounter: Payer: Self-pay | Admitting: Internal Medicine

## 2022-01-03 MED ORDER — TEMAZEPAM 15 MG PO CAPS: 15.0000 mg | ORAL_CAPSULE | Freq: Every evening | ORAL | 0 refills | Status: DC | PRN

## 2022-01-03 MED ORDER — HYDROCODONE-ACETAMINOPHEN 7.5-325 MG PO TABS: 1.0000 | ORAL_TABLET | Freq: Two times a day (BID) | ORAL | 0 refills | Status: DC | PRN

## 2022-01-30 ENCOUNTER — Ambulatory Visit (INDEPENDENT_AMBULATORY_CARE_PROVIDER_SITE_OTHER): Payer: Medicare (Managed Care) | Admitting: Internal Medicine

## 2022-01-30 ENCOUNTER — Encounter: Payer: Self-pay | Admitting: Internal Medicine

## 2022-01-30 VITALS — BP 122/72 | HR 58 | Temp 97.6°F | Ht 65.0 in | Wt 135.0 lb

## 2022-01-30 DIAGNOSIS — G8929 Other chronic pain: Secondary | ICD-10-CM | POA: Diagnosis not present

## 2022-01-30 DIAGNOSIS — F411 Generalized anxiety disorder: Secondary | ICD-10-CM | POA: Diagnosis not present

## 2022-01-30 DIAGNOSIS — F1120 Opioid dependence, uncomplicated: Secondary | ICD-10-CM

## 2022-01-30 DIAGNOSIS — M545 Low back pain, unspecified: Secondary | ICD-10-CM | POA: Diagnosis not present

## 2022-01-30 DIAGNOSIS — M19011 Primary osteoarthritis, right shoulder: Secondary | ICD-10-CM | POA: Diagnosis not present

## 2022-01-30 NOTE — Assessment & Plan Note (Signed)
Still with trouble going out ?Uses 2 temazepam daily ?

## 2022-01-30 NOTE — Assessment & Plan Note (Signed)
Some crepitus ?Good ROM though ?Did great with a steroid injection 1 year ago--wants to try again (we can set that up) ?
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
## 2022-01-30 NOTE — Assessment & Plan Note (Signed)
Stable---usually uses the hydrocodone at least 3 per day ?

## 2022-01-30 NOTE — Assessment & Plan Note (Signed)
PDMP reviewed No concerns 

## 2022-02-02 LAB — DRUG MONITORING, PANEL 8 WITH CONFIRMATION, URINE
6 Acetylmorphine: NEGATIVE ng/mL (ref <10)
Alcohol Metabolites: NEGATIVE ng/mL (ref <500)
Alphahydroxyalprazolam: NEGATIVE ng/mL (ref <25)
Alphahydroxymidazolam: NEGATIVE ng/mL (ref <50)
Alphahydroxytriazolam: NEGATIVE ng/mL (ref <50)
Aminoclonazepam: NEGATIVE ng/mL (ref <25)
Amphetamines: NEGATIVE ng/mL (ref <500)
Benzodiazepines: POSITIVE ng/mL — AB (ref <100)
Buprenorphine, Urine: NEGATIVE ng/mL (ref <5)
Cocaine Metabolite: NEGATIVE ng/mL (ref <150)
Codeine: NEGATIVE ng/mL (ref <50)
Creatinine: 68.3 mg/dL (ref >=20.0)
Hydrocodone: 403 ng/mL — ABNORMAL HIGH (ref <50)
Hydromorphone: 75 ng/mL — ABNORMAL HIGH (ref <50)
Hydroxyethylflurazepam: NEGATIVE ng/mL (ref <50)
Lorazepam: NEGATIVE ng/mL (ref <50)
MDMA: NEGATIVE ng/mL (ref <500)
Marijuana Metabolite: NEGATIVE ng/mL (ref <20)
Morphine: NEGATIVE ng/mL (ref <50)
Nordiazepam: NEGATIVE ng/mL (ref <50)
Norhydrocodone: 1957 ng/mL — ABNORMAL HIGH (ref <50)
Opiates: POSITIVE ng/mL — AB (ref <100)
Oxazepam: 840 ng/mL — ABNORMAL HIGH (ref <50)
Oxidant: NEGATIVE ug/mL (ref <200)
Oxycodone: NEGATIVE ng/mL (ref <100)
Temazepam: >6250 ng/mL — ABNORMAL HIGH (ref <50)
pH: 7.4 (ref 4.5–9.0)

## 2022-02-02 LAB — DM TEMPLATE

## 2022-02-06 ENCOUNTER — Other Ambulatory Visit: Payer: Self-pay | Admitting: Internal Medicine

## 2022-02-06 NOTE — Telephone Encounter (Signed)
Name of Medication: Hydrocodone ? Name of Pharmacy:  [facility]   ?Last Written Date and Quantity: 01-03-22 #120  ? Last Office Visit and Type: 01-30-22 ? Next Office Visit and Type: 02-10-24 ? Last Controlled Substance Agreement Date: 01-16-21 ? Last UDS: 01-16-21 ?  ?Last time Temazepam was filled on 01-03-22 #60 0 refill ?

## 2022-02-07 MED ORDER — TEMAZEPAM 15 MG PO CAPS: 15.0000 mg | ORAL_CAPSULE | Freq: Every evening | ORAL | 0 refills | Status: DC | PRN

## 2022-02-07 MED ORDER — HYDROCODONE-ACETAMINOPHEN 7.5-325 MG PO TABS: 1.0000 | ORAL_TABLET | Freq: Two times a day (BID) | ORAL | 0 refills | Status: DC | PRN

## 2022-02-09 ENCOUNTER — Ambulatory Visit (INDEPENDENT_AMBULATORY_CARE_PROVIDER_SITE_OTHER): Payer: Medicare (Managed Care) | Admitting: Internal Medicine

## 2022-02-09 ENCOUNTER — Encounter: Payer: Self-pay | Admitting: Internal Medicine

## 2022-02-09 DIAGNOSIS — M19011 Primary osteoarthritis, right shoulder: Secondary | ICD-10-CM | POA: Diagnosis not present

## 2022-02-09 MED ORDER — TRIAMCINOLONE ACETONIDE 40 MG/ML IJ SUSP
40.0000 mg | Freq: Once | INTRAMUSCULAR | Status: AC
  Administered 2022-02-09: 40 mg via INTRA_ARTICULAR

## 2022-02-09 NOTE — Assessment & Plan Note (Signed)
Discussed alternatives and potential side effects ?She gave verbal consent ? ?Sterile prep---posterior right shoulder ?Ethyl chloride/2cc 1% plain lidocaine ?40mg  kenalog/4cc lidocaine instilled in shoulder ?Tolerated well ?Discussed home care ?

## 2022-02-09 NOTE — Addendum Note (Signed)
Addended by: Eual Fines on: 02/09/2022 04:06 PM ? ? Modules accepted: Orders ? ?

## 2022-02-09 NOTE — Progress Notes (Signed)
? ?Subjective:  ? ? Patient ID: Rachel Vaughn, female    DOB: 1961/01/13, 61 y.o.   MRN: [id number] ? ?HPI ?Here for cortisone injection in right shoulder ?Known arthritis and increasing pain ?Had 6-7 months of improvement after last injection about a year ago ? ?Current Outpatient Medications on File Prior to Visit  ?Medication Sig Dispense Refill  ? busPIRone (BUSPAR) 10 MG tablet Take 1 tablet (10 mg total) by mouth 2 (two) times daily. 180 tablet 3  ? cetirizine (ZYRTEC) 10 MG tablet Take 10 mg by mouth daily.    ? citalopram (CELEXA) 20 MG tablet TAKE ONE TABLET BY MOUTH DAILY 90 tablet 3  ? diclofenac sodium (VOLTAREN) 1 % GEL Apply 2 g topically 4 (four) times daily as needed. To hands 200 g 11  ? fluticasone (FLONASE) 50 MCG/ACT nasal spray Place 2 sprays into both nostrils daily.    ? HYDROcodone-acetaminophen (NORCO) 7.5-325 MG tablet Take 1-2 tablets by mouth 2 (two) times daily as needed. 120 tablet 0  ? hydrocortisone 2.5 % cream Apply topically 3 (three) times daily as needed. 28 g 3  ? omeprazole (PRILOSEC) 20 MG capsule TAKE 1 CAPSULE BY MOUTH 2 TIMES A DAY BEFORE MEALS 60 capsule 11  ? temazepam (RESTORIL) 15 MG capsule Take 1-2 capsules (15-30 mg total) by mouth at bedtime as needed for sleep. 60 capsule 0  ? tiZANidine (ZANAFLEX) 2 MG tablet Take 1 tablet (2 mg total) by mouth every 6 (six) hours as needed for muscle spasms. 30 tablet 1  ? tretinoin (RETIN-A) 0.01 % gel Apply topically at bedtime. 45 g 0  ? triamcinolone cream (KENALOG) 0.1 % apply to affected area twice a day AS NEEDED 30 g 2  ? valACYclovir (VALTREX) 1000 MG tablet Take 1 tablet (1,000 mg total) by mouth 2 (two) times daily as needed. 30 tablet 1  ? VENTOLIN HFA 108 (90 Base) MCG/ACT inhaler INHALE 2 PUFFS BY MOUTH EVERY 6 HOURS IF NEEDED FOR WHEEZING OR SHORTNESS OF BREATH. USE WITH SPACER 18 g 2  ? ?No current facility-administered medications on file prior to visit.  ? ? ?Allergies  ?Allergen Reactions  ? Fluoxetine Hcl   ?   REACTION: unspecified  ? Paroxetine   ?  REACTION: unspecified  ? Poison Ivy Extract   ? Venlafaxine   ?  REACTION: unspecified  ? ? ?Past Medical History:  ?Diagnosis Date  ? Anxiety   ? Asthma   ? Diverticulitis   ? GERD (gastroesophageal reflux disease)   ? Social anxiety disorder   ? ? ?Past Surgical History:  ?Procedure Laterality Date  ? BREAST ENHANCEMENT SURGERY  1987  ? CESAREAN SECTION    ? ? ?Family History  ?Problem Relation Age of Onset  ? Asthma Mother   ? COPD Mother   ? Diabetes Maternal Grandmother   ? Cancer Neg Hx   ? ? ?Social History  ? ?Socioeconomic History  ? Marital status: Divorced  ?  Spouse name: Not on file  ? Number of children: 2  ? Years of education: Not on file  ? Highest education level: Not on file  ?Occupational History  ? Occupation: Disabled ---anxiety  ?Tobacco Use  ? Smoking status: Never  ?  Passive exposure: Past  ? Smokeless tobacco: Never  ?Substance and Sexual Activity  ? Alcohol use: Yes  ? Drug use: No  ? Sexual activity: Not on file  ?Other Topics Concern  ? Not on file  ?Social  History Narrative  ? No living will  ? Would want children to make decisions  ? Would accept resuscitation  ? Not sure about tube feeds   ? ?Social Determinants of Health  ? ?Financial Resource Strain: Not on file  ?Food Insecurity: Not on file  ?Transportation Needs: Not on file  ?Physical Activity: Not on file  ?Stress: Not on file  ?Social Connections: Not on file  ?Intimate Partner Violence: Not on file  ? ?Review of Systems ? ?   ?Objective:  ? Physical Exam ?Musculoskeletal:  ?   Comments: Same decreased ROM and some crepitus  ?  ? ? ? ? ?   ?Assessment & Plan:  ? ?

## 2022-03-08 ENCOUNTER — Other Ambulatory Visit: Payer: Self-pay | Admitting: Internal Medicine

## 2022-03-08 MED ORDER — HYDROCODONE-ACETAMINOPHEN 7.5-325 MG PO TABS: 1.0000 | ORAL_TABLET | Freq: Two times a day (BID) | ORAL | 0 refills | Status: DC | PRN

## 2022-03-08 MED ORDER — TEMAZEPAM 15 MG PO CAPS: 15.0000 mg | ORAL_CAPSULE | Freq: Every evening | ORAL | 0 refills | Status: DC | PRN

## 2022-03-08 NOTE — Telephone Encounter (Signed)
Name of Medication: Hydrocodone  Name of Pharmacy:  [facility]   Last Written Date and Quantity: 01-03-22 #120   Last Office Visit and Type: 02-09-22  Next Office Visit and Type: 05-08-22  Last Controlled Substance Agreement Date: 01-30-22  Last UDS: 01-30-22

## 2022-04-04 ENCOUNTER — Other Ambulatory Visit: Payer: Self-pay | Admitting: Internal Medicine

## 2022-04-04 NOTE — Telephone Encounter (Signed)
Name of Medication: Hydrocodone  Name of Pharmacy:  [facility]   Last Written Date and Quantity: 03-08-22 #120   Last Office Visit and Type: 02-09-22  Next Office Visit and Type: 05-08-22  Last Controlled Substance Agreement Date: 01-30-22  Last UDS: 01-30-22  Temazepam last filled 03-08-22 #60

## 2022-04-05 MED ORDER — HYDROCODONE-ACETAMINOPHEN 7.5-325 MG PO TABS: 1.0000 | ORAL_TABLET | Freq: Two times a day (BID) | ORAL | 0 refills | Status: DC | PRN

## 2022-04-05 MED ORDER — TEMAZEPAM 15 MG PO CAPS
15.0000 mg | ORAL_CAPSULE | Freq: Every evening | ORAL | 0 refills | Status: DC | PRN
Start: 2022-04-05 — End: 2022-05-06

## 2022-05-06 ENCOUNTER — Other Ambulatory Visit: Payer: Self-pay | Admitting: Internal Medicine

## 2022-05-07 MED ORDER — HYDROCODONE-ACETAMINOPHEN 7.5-325 MG PO TABS: 1.0000 | ORAL_TABLET | Freq: Two times a day (BID) | ORAL | 0 refills | Status: DC | PRN

## 2022-05-07 MED ORDER — TEMAZEPAM 15 MG PO CAPS: 15.0000 mg | ORAL_CAPSULE | Freq: Every evening | ORAL | 0 refills | Status: DC | PRN

## 2022-05-07 NOTE — Telephone Encounter (Signed)
Name of Medication: Hydrocodone  Name of Pharmacy:  [facility]   Last Written Date and Quantity: 04-05-22 #120   Last Office Visit and Type: 02-09-22  Next Office Visit and Type: 05-08-22  Last Controlled Substance Agreement Date: 01-30-22  Last UDS: 01-30-22   Temazepam last filled 04-05-22 #60

## 2022-05-08 ENCOUNTER — Encounter: Payer: Self-pay | Admitting: Internal Medicine

## 2022-05-08 ENCOUNTER — Ambulatory Visit (INDEPENDENT_AMBULATORY_CARE_PROVIDER_SITE_OTHER): Payer: Medicare (Managed Care) | Admitting: Internal Medicine

## 2022-05-08 VITALS — BP 110/70 | HR 63 | Temp 97.7°F | Ht 65.0 in | Wt 136.0 lb

## 2022-05-08 DIAGNOSIS — F1120 Opioid dependence, uncomplicated: Secondary | ICD-10-CM | POA: Diagnosis not present

## 2022-05-08 DIAGNOSIS — M545 Low back pain, unspecified: Secondary | ICD-10-CM

## 2022-05-08 DIAGNOSIS — G8929 Other chronic pain: Secondary | ICD-10-CM

## 2022-05-08 DIAGNOSIS — F411 Generalized anxiety disorder: Secondary | ICD-10-CM

## 2022-05-08 DIAGNOSIS — R5383 Other fatigue: Secondary | ICD-10-CM | POA: Diagnosis not present

## 2022-05-08 LAB — COMPREHENSIVE METABOLIC PANEL
ALT: 11 U/L (ref 0–35)
AST: 19 U/L (ref 0–37)
Albumin: 4.5 g/dL (ref 3.5–5.2)
Alkaline Phosphatase: 57 U/L (ref 39–117)
BUN: 15 mg/dL (ref 6–23)
CO2: 24 mEq/L (ref 19–32)
Calcium: 9.8 mg/dL (ref 8.4–10.5)
Chloride: 102 mEq/L (ref 96–112)
Creatinine, Ser: 0.85 mg/dL (ref 0.40–1.20)
GFR: 73.49 mL/min (ref >60.00)
Glucose, Bld: 94 mg/dL (ref 70–99)
Potassium: 4.1 mEq/L (ref 3.5–5.1)
Sodium: 137 mEq/L (ref 135–145)
Total Bilirubin: 0.3 mg/dL (ref 0.2–1.2)
Total Protein: 7.2 g/dL (ref 6.0–8.3)

## 2022-05-08 LAB — CBC
HCT: 40.9 % (ref 36.0–46.0)
Hemoglobin: 13.7 g/dL (ref 12.0–15.0)
MCHC: 33.6 g/dL (ref 30.0–36.0)
MCV: 95.9 fl (ref 78.0–100.0)
Platelets: 209 10*3/uL (ref 150.0–400.0)
RBC: 4.26 Mil/uL (ref 3.87–5.11)
RDW: 12.6 % (ref 11.5–15.5)
WBC: 5.4 10*3/uL (ref 4.0–10.5)

## 2022-05-08 LAB — T4, FREE: Free T4: 0.75 ng/dL (ref 0.60–1.60)

## 2022-05-08 LAB — SEDIMENTATION RATE: Sed Rate: 19 mm/hr (ref 0–30)

## 2022-05-08 NOTE — Assessment & Plan Note (Signed)
PDMP reviewed No concerns 

## 2022-05-08 NOTE — Assessment & Plan Note (Signed)
Ongoing issues--may be the reason for her somatic symptoms Continues on the buspar 10 once a day---may be worth trying to increase to bid Temazepam 15 once or twice a day Citalopram 20mg  daily

## 2022-05-08 NOTE — Assessment & Plan Note (Signed)
Ongoing issues Uses the norco tid mostly

## 2022-05-08 NOTE — Patient Instructions (Signed)
Please increase the buspirone to 10mg  twice a day

## 2022-05-08 NOTE — Assessment & Plan Note (Signed)
With multiple somatic symptoms Could be hormonal component--but I am not excited about treatment for that Nothing specific on history and exam is reassuring Will check some labs

## 2022-05-08 NOTE — Progress Notes (Signed)
Subjective:    Patient ID: Rachel Vaughn, female    DOB: 1960/10/24, 61 y.o.   MRN: [id number]  HPI Here for follow up of chronic back pain and mood issues  "I am not feeling well" "Having spells, hot/cold/sweating/dizziness"  Has cut out cheesecake and beer Concerned about caffeine in sweet tea--due to reflux Now drinking water instead--but still doesn't feel right Gets spells---like "weak in the knees"  After going [place] with new couple (daughter's in laws) Limited the time--but then barely made it home and slept for hours Had limited carbs but added some back  Is more anxious and feels shaky Some bouts of diarrhea--has happened in the past with anxiety  Rental unit still working out Hasn't been going out too much--but did go to [employer] last week Only taking daytime temazepam if she is going out (they make her sleepy otherwise)  Wondered about menopause being an issue Did have rare periods will into her 50's--but none for some years  Ongoing back pain Tries to limit hydrocodone to three per day  Still hasn't found a psychologist---got very big bill from the person she didn't like Now with other money issues--makes it hard to find someone else  Current Outpatient Medications on File Prior to Visit  Medication Sig Dispense Refill   busPIRone (BUSPAR) 10 MG tablet Take 1 tablet (10 mg total) by mouth 2 (two) times daily. 180 tablet 3   cetirizine (ZYRTEC) 10 MG tablet Take 10 mg by mouth daily.     citalopram (CELEXA) 20 MG tablet TAKE ONE TABLET BY MOUTH DAILY 90 tablet 3   diclofenac sodium (VOLTAREN) 1 % GEL Apply 2 g topically 4 (four) times daily as needed. To hands 200 g 11   fluticasone (FLONASE) 50 MCG/ACT nasal spray Place 2 sprays into both nostrils daily.     HYDROcodone-acetaminophen (NORCO) 7.5-325 MG tablet Take 1-2 tablets by mouth 2 (two) times daily as needed. 120 tablet 0   hydrocortisone 2.5 % cream Apply topically 3 (three) times  daily as needed. 28 g 3   omeprazole (PRILOSEC) 20 MG capsule TAKE 1 CAPSULE BY MOUTH 2 TIMES A DAY BEFORE MEALS 60 capsule 11   temazepam (RESTORIL) 15 MG capsule Take 1-2 capsules (15-30 mg total) by mouth at bedtime as needed for sleep. 60 capsule 0   tiZANidine (ZANAFLEX) 2 MG tablet Take 1 tablet (2 mg total) by mouth every 6 (six) hours as needed for muscle spasms. 30 tablet 1   tretinoin (RETIN-A) 0.01 % gel Apply topically at bedtime. 45 g 0   triamcinolone cream (KENALOG) 0.1 % apply to affected area twice a day AS NEEDED 30 g 2   valACYclovir (VALTREX) 1000 MG tablet Take 1 tablet (1,000 mg total) by mouth 2 (two) times daily as needed. 30 tablet 1   VENTOLIN HFA 108 (90 Base) MCG/ACT inhaler INHALE 2 PUFFS BY MOUTH EVERY 6 HOURS IF NEEDED FOR WHEEZING OR SHORTNESS OF BREATH. USE WITH SPACER 18 g 2   No current facility-administered medications on file prior to visit.    Allergies  Allergen Reactions   Fluoxetine Hcl     REACTION: unspecified   Paroxetine     REACTION: unspecified   Poison Ivy Extract    Venlafaxine     REACTION: unspecified    Past Medical History:  Diagnosis Date   Anxiety    Asthma    Diverticulitis    GERD (gastroesophageal reflux disease)    Social  anxiety disorder     Past Surgical History:  Procedure Laterality Date   BREAST ENHANCEMENT SURGERY  1987   CESAREAN SECTION      Family History  Problem Relation Age of Onset   Asthma Mother    COPD Mother    Diabetes Maternal Grandmother    Cancer Neg Hx     Social History   Socioeconomic History   Marital status: Divorced    Spouse name: Not on file   Number of children: 2   Years of education: Not on file   Highest education level: Not on file  Occupational History   Occupation: Disabled ---anxiety  Tobacco Use   Smoking status: Never    Passive exposure: Past   Smokeless tobacco: Never  Substance and Sexual Activity   Alcohol use: Yes   Drug use: No   Sexual activity: Not  on file  Other Topics Concern   Not on file  Social History Narrative   No living will   Would want children to make decisions   Would accept resuscitation   Not sure about tube feeds    Social Determinants of Health   Financial Resource Strain: Not on file  Food Insecurity: Not on file  Transportation Needs: Not on file  Physical Activity: Not on file  Stress: Not on file  Social Connections: Not on file  Intimate Partner Violence: Not on file   Review of Systems Sleeps okay--awakens at times with sweats but still feels she does okay Son does invite her out ---like to the movies Still stress with daughter--but not bothering her too much SOB only with spells--and palpitaitons. Likely just the anxiety     Objective:   Physical Exam Constitutional:      Appearance: Normal appearance.  Cardiovascular:     Rate and Rhythm: Normal rate and regular rhythm.     Heart sounds: No murmur heard.    No gallop.  Pulmonary:     Effort: Pulmonary effort is normal.     Breath sounds: Normal breath sounds. No wheezing or rales.  Abdominal:     Palpations: Abdomen is soft.     Tenderness: There is no abdominal tenderness.  Musculoskeletal:     Cervical back: Neck supple.     Right lower leg: No edema.     Left lower leg: No edema.  Lymphadenopathy:     Cervical: No cervical adenopathy.  Neurological:     General: No focal deficit present.     Mental Status: She is alert.  Psychiatric:     Comments: Shaky and clearly anxious            Assessment & Plan:

## 2022-06-03 ENCOUNTER — Telehealth: Payer: Self-pay | Admitting: Internal Medicine

## 2022-06-04 MED ORDER — HYDROCODONE-ACETAMINOPHEN 7.5-325 MG PO TABS: 1.0000 | ORAL_TABLET | Freq: Two times a day (BID) | ORAL | 0 refills | Status: DC | PRN

## 2022-06-04 MED ORDER — TEMAZEPAM 15 MG PO CAPS: 15.0000 mg | ORAL_CAPSULE | Freq: Every evening | ORAL | 0 refills | Status: DC | PRN

## 2022-06-04 NOTE — Telephone Encounter (Signed)
Name of Medication: Hydrocodone  Name of Pharmacy:  [facility]   Last Written Date and Quantity: 05-07-22 #120   Last Office Visit and Type: 05-08-22  Next Office Visit and Type: 06-19-22  Last Controlled Substance Agreement Date: 01-30-22  Last UDS: 01-30-22   Temazepam last filled 05-07-22 #60

## 2022-06-04 NOTE — Telephone Encounter (Signed)
Spoke to pt. I advised her I called and left a message on the VM at the pharmacy authorizing an early refill due to [place]. It is only 3 days early.

## 2022-06-04 NOTE — Telephone Encounter (Signed)
Hydrocodone---Verbal okay needed to fill early (not due for 2 more days), she uses [facility], not regular insurance  Patient is leaving her home at 2am and needs this medication before she goes  If there is any issues, she said that she could pick it up in [facility] pharmacy closes at 6pm

## 2022-06-19 ENCOUNTER — Ambulatory Visit: Payer: Medicare (Managed Care) | Admitting: Internal Medicine

## 2022-06-26 ENCOUNTER — Encounter: Payer: Self-pay | Admitting: Internal Medicine

## 2022-06-26 ENCOUNTER — Ambulatory Visit (INDEPENDENT_AMBULATORY_CARE_PROVIDER_SITE_OTHER): Payer: Medicare (Managed Care) | Admitting: Internal Medicine

## 2022-06-26 DIAGNOSIS — F411 Generalized anxiety disorder: Secondary | ICD-10-CM | POA: Diagnosis not present

## 2022-06-26 NOTE — Assessment & Plan Note (Signed)
Better now Fatigue did seem to be from this---better now Continues on the citalopram 20 and buspar 10 (I told her to take bid)

## 2022-06-26 NOTE — Progress Notes (Signed)
Subjective:    Patient ID: Rachel Vaughn, female    DOB: Oct 02, 1961, 61 y.o.   MRN: [id number]  HPI Here for follow up of fatigue and anxiety  Is feeling better Went on a trip---flew to [place] then [employer] to [employer] with her to [employer] major [place] ride between [place] there!!  Did increase the buspar to twice a day at first Then forgot to pack extra for her trip  Current Outpatient Medications on File Prior to Visit  Medication Sig Dispense Refill   busPIRone (BUSPAR) 10 MG tablet Take 1 tablet (10 mg total) by mouth 2 (two) times daily. 180 tablet 3   cetirizine (ZYRTEC) 10 MG tablet Take 10 mg by mouth daily.     citalopram (CELEXA) 20 MG tablet TAKE ONE TABLET BY MOUTH DAILY 90 tablet 3   diclofenac sodium (VOLTAREN) 1 % GEL Apply 2 g topically 4 (four) times daily as needed. To hands 200 g 11   fluticasone (FLONASE) 50 MCG/ACT nasal spray Place 2 sprays into both nostrils daily.     HYDROcodone-acetaminophen (NORCO) 7.5-325 MG tablet Take 1-2 tablets by mouth 2 (two) times daily as needed. 120 tablet 0   hydrocortisone 2.5 % cream Apply topically 3 (three) times daily as needed. 28 g 3   omeprazole (PRILOSEC) 20 MG capsule TAKE 1 CAPSULE BY MOUTH 2 TIMES A DAY BEFORE MEALS 60 capsule 11   temazepam (RESTORIL) 15 MG capsule Take 1-2 capsules (15-30 mg total) by mouth at bedtime as needed for sleep. 60 capsule 0   tiZANidine (ZANAFLEX) 2 MG tablet Take 1 tablet (2 mg total) by mouth every 6 (six) hours as needed for muscle spasms. 30 tablet 1   tretinoin (RETIN-A) 0.01 % gel Apply topically at bedtime. 45 g 0   triamcinolone cream (KENALOG) 0.1 % apply to affected area twice a day AS NEEDED 30 g 2   valACYclovir (VALTREX) 1000 MG tablet Take 1 tablet (1,000 mg total) by mouth 2 (two) times daily as needed. 30 tablet 1   VENTOLIN HFA 108 (90 Base) MCG/ACT inhaler INHALE 2 PUFFS BY MOUTH EVERY 6 HOURS IF NEEDED FOR WHEEZING OR SHORTNESS OF BREATH. USE  WITH SPACER 18 g 2   No current facility-administered medications on file prior to visit.    Allergies  Allergen Reactions   Fluoxetine Hcl     REACTION: unspecified   Paroxetine     REACTION: unspecified   Poison Ivy Extract    Venlafaxine     REACTION: unspecified    Past Medical History:  Diagnosis Date   Anxiety    Asthma    Diverticulitis    GERD (gastroesophageal reflux disease)    Social anxiety disorder     Past Surgical History:  Procedure Laterality Date   BREAST ENHANCEMENT SURGERY  1987   CESAREAN SECTION      Family History  Problem Relation Age of Onset   Asthma Mother    COPD Mother    Diabetes Maternal Grandmother    Cancer Neg Hx     Social History   Socioeconomic History   Marital status: Divorced    Spouse name: Not on file   Number of children: 2   Years of education: Not on file   Highest education level: Not on file  Occupational History   Occupation: Disabled ---anxiety  Tobacco Use   Smoking status: Never    Passive exposure: Past   Smokeless tobacco: Never  Substance  and Sexual Activity   Alcohol use: Yes   Drug use: No   Sexual activity: Not on file  Other Topics Concern   Not on file  Social History Narrative   No living will   Would want children to make decisions   Would accept resuscitation   Not sure about tube feeds    Social Determinants of Health   Financial Resource Strain: Not on file  Food Insecurity: Not on file  Transportation Needs: Not on file  Physical Activity: Not on file  Stress: Not on file  Social Connections: Not on file  Intimate Partner Violence: Not on file   Review of Systems Eating normally again---- stopped the tight diet Recent cold-after plane flight home. Now improved (about 2 weeks ago) COVID was negative     Objective:   Physical Exam Constitutional:      Appearance: Normal appearance.  Neurological:     Mental Status: She is alert.  Psychiatric:        Mood and Affect:  Mood normal.        Behavior: Behavior normal.           Assessment & Plan:

## 2022-07-04 ENCOUNTER — Other Ambulatory Visit: Payer: Self-pay | Admitting: Internal Medicine

## 2022-07-05 MED ORDER — HYDROCODONE-ACETAMINOPHEN 7.5-325 MG PO TABS: 1.0000 | ORAL_TABLET | Freq: Two times a day (BID) | ORAL | 0 refills | Status: DC | PRN

## 2022-07-05 MED ORDER — TEMAZEPAM 15 MG PO CAPS: 15.0000 mg | ORAL_CAPSULE | Freq: Every evening | ORAL | 0 refills | Status: DC | PRN

## 2022-07-05 NOTE — Telephone Encounter (Signed)
Name of Medication: Hydrocodone Name of Pharmacy:  [facility]   Last Written Date and Quantity: 06-04-22 #120  Last Office Visit and Type: 06-26-22 Next Office Visit and Type: 10-29-22 Last Controlled Substance Agreement Date: 01-30-22 Last UDS: 01-30-22   Temazepam last filled 06-04-22 #60

## 2022-07-10 ENCOUNTER — Other Ambulatory Visit: Payer: Self-pay | Admitting: Internal Medicine

## 2022-07-10 NOTE — Telephone Encounter (Signed)
  Encourage patient to contact the pharmacy for refills or they can request refills through [facility]  Did the patient contact the pharmacy: Yes  LAST APPOINTMENT DATE: 06/26/2022  NEXT APPOINTMENT DATE: 10/29/2022  MEDICATION: tiZANidine (ZANAFLEX) 2 MG tablet  Is the patient out of medication? Yes  PHARMACY: [id number] - [place], [address] AT [address]  Let patient know to contact pharmacy at the end of the day to make sure medication is ready.  Please notify patient to allow 48-72 hours to process

## 2022-07-11 MED ORDER — TIZANIDINE HCL 2 MG PO TABS: 2.0000 mg | ORAL_TABLET | Freq: Four times a day (QID) | ORAL | 1 refills | Status: AC | PRN

## 2022-08-01 ENCOUNTER — Other Ambulatory Visit: Payer: Self-pay | Admitting: Internal Medicine

## 2022-08-06 ENCOUNTER — Other Ambulatory Visit: Payer: Self-pay | Admitting: Internal Medicine

## 2022-08-06 MED ORDER — TEMAZEPAM 15 MG PO CAPS: 15.0000 mg | ORAL_CAPSULE | Freq: Every evening | ORAL | 0 refills | Status: DC | PRN

## 2022-08-06 MED ORDER — HYDROCODONE-ACETAMINOPHEN 7.5-325 MG PO TABS: 1.0000 | ORAL_TABLET | Freq: Two times a day (BID) | ORAL | 0 refills | Status: DC | PRN

## 2022-08-06 NOTE — Telephone Encounter (Signed)
Name of Medication: Hydrocodone Name of Pharmacy:  [facility]   Last Written Date and Quantity: 07-05-22 #120  Last Office Visit and Type: 06-26-22 Next Office Visit and Type: 10-29-22 Last Controlled Substance Agreement Date: 01-30-22 Last UDS: 01-30-22   Temazepam last filled 07-05-22 #60

## 2022-09-06 ENCOUNTER — Other Ambulatory Visit: Payer: Self-pay | Admitting: Internal Medicine

## 2022-09-06 MED ORDER — TEMAZEPAM 15 MG PO CAPS: 15.0000 mg | ORAL_CAPSULE | Freq: Every evening | ORAL | 0 refills | Status: DC | PRN

## 2022-09-06 MED ORDER — HYDROCODONE-ACETAMINOPHEN 7.5-325 MG PO TABS: 1.0000 | ORAL_TABLET | Freq: Two times a day (BID) | ORAL | 0 refills | Status: DC | PRN

## 2022-09-06 NOTE — Telephone Encounter (Signed)
Name of Medication: Hydrocodone Name of Pharmacy:  [facility]   Last Written Date and Quantity: 08-06-22 #120  Last Office Visit and Type: 06-26-22 Next Office Visit and Type: 10-29-22 Last Controlled Substance Agreement Date: 01-30-22 Last UDS: 01-30-22   Temazepam last filled 08-06-22 #60

## 2022-10-03 ENCOUNTER — Other Ambulatory Visit: Payer: Self-pay | Admitting: Internal Medicine

## 2022-10-03 MED ORDER — HYDROCODONE-ACETAMINOPHEN 7.5-325 MG PO TABS: 1.0000 | ORAL_TABLET | Freq: Two times a day (BID) | ORAL | 0 refills | Status: DC | PRN

## 2022-10-03 MED ORDER — TEMAZEPAM 15 MG PO CAPS: 15.0000 mg | ORAL_CAPSULE | Freq: Every evening | ORAL | 0 refills | Status: DC | PRN

## 2022-10-03 NOTE — Telephone Encounter (Signed)
Name of Medication: Hydrocodone Name of Pharmacy:  [facility]   Last Written Date and Quantity: 09-06-22 #120  Last Office Visit and Type: 06-26-22 Next Office Visit and Type: 10-29-22 Last Controlled Substance Agreement Date: 01-30-22 Last UDS: 01-30-22   Temazepam last filled 09-06-22 #60

## 2022-10-26 ENCOUNTER — Ambulatory Visit: Payer: Medicare (Managed Care) | Admitting: Internal Medicine

## 2022-10-29 ENCOUNTER — Ambulatory Visit (INDEPENDENT_AMBULATORY_CARE_PROVIDER_SITE_OTHER): Payer: Medicare HMO | Admitting: Internal Medicine

## 2022-10-29 ENCOUNTER — Encounter: Payer: Self-pay | Admitting: Internal Medicine

## 2022-10-29 VITALS — BP 118/78 | HR 62 | Temp 97.0°F | Ht 65.5 in | Wt 140.0 lb

## 2022-10-29 DIAGNOSIS — F411 Generalized anxiety disorder: Secondary | ICD-10-CM | POA: Diagnosis not present

## 2022-10-29 DIAGNOSIS — M545 Low back pain, unspecified: Secondary | ICD-10-CM | POA: Diagnosis not present

## 2022-10-29 DIAGNOSIS — M19011 Primary osteoarthritis, right shoulder: Secondary | ICD-10-CM | POA: Diagnosis not present

## 2022-10-29 DIAGNOSIS — G8929 Other chronic pain: Secondary | ICD-10-CM

## 2022-10-29 DIAGNOSIS — F1120 Opioid dependence, uncomplicated: Secondary | ICD-10-CM | POA: Diagnosis not present

## 2022-10-29 DIAGNOSIS — F1320 Sedative, hypnotic or anxiolytic dependence, uncomplicated: Secondary | ICD-10-CM | POA: Diagnosis not present

## 2022-10-29 DIAGNOSIS — F3341 Major depressive disorder, recurrent, in partial remission: Secondary | ICD-10-CM

## 2022-10-29 NOTE — Assessment & Plan Note (Signed)
Ongoing fairly limiting pain Able to do ADLs, etc with the hydrocodone 4 per day

## 2022-10-29 NOTE — Progress Notes (Signed)
Subjective:    Patient ID: Rachel Vaughn, female    DOB: [birth year], 62 y.o.   MRN: [id number]  HPI Here for follow up of severe anxiety and chronic pain  Ongoing problems with right shoulder pain Cortisone injection only helped briefly last year  Did not have a good session with the last counselor He "wanted to reinvent the wheel" Prior therapist mentioned ketamine and marijuana She is not really interested in any of that  Anxiety is still very severe Just "can't get out the door"---unless she is desperate for groceries Son now in [place] now--may look to move closer to them Doesn't really feel depressed per se Enjoys TV and not anhedonic (it will distract her and relieve her anxiety)  Current Outpatient Medications on File Prior to Visit  Medication Sig Dispense Refill   busPIRone (BUSPAR) 10 MG tablet TAKE 1 TABLET BY MOUTH TWICE DAILY 180 tablet 3   cetirizine (ZYRTEC) 10 MG tablet Take 10 mg by mouth daily.     citalopram (CELEXA) 20 MG tablet TAKE 1 TABLET BY MOUTH EVERY DAY 90 tablet 3   diclofenac sodium (VOLTAREN) 1 % GEL Apply 2 g topically 4 (four) times daily as needed. To hands 200 g 11   fluticasone (FLONASE) 50 MCG/ACT nasal spray Place 2 sprays into both nostrils daily.     HYDROcodone-acetaminophen (NORCO) 7.5-325 MG tablet Take 1-2 tablets by mouth 2 (two) times daily as needed. 120 tablet 0   hydrocortisone 2.5 % cream Apply topically 3 (three) times daily as needed. 28 g 3   omeprazole (PRILOSEC) 20 MG capsule TAKE 1 CAPSULE BY MOUTH 2 TIMES A DAY BEFORE MEALS 60 capsule 11   temazepam (RESTORIL) 15 MG capsule Take 1-2 capsules (15-30 mg total) by mouth at bedtime as needed for sleep. 60 capsule 0   tiZANidine (ZANAFLEX) 2 MG tablet Take 1 tablet (2 mg total) by mouth every 6 (six) hours as needed for muscle spasms. 30 tablet 1   tretinoin (RETIN-A) 0.01 % gel Apply topically at bedtime. 45 g 0   triamcinolone cream (KENALOG) 0.1 % apply to affected area  twice a day AS NEEDED 30 g 2   valACYclovir (VALTREX) 1000 MG tablet Take 1 tablet (1,000 mg total) by mouth 2 (two) times daily as needed. 30 tablet 1   VENTOLIN HFA 108 (90 Base) MCG/ACT inhaler INHALE 2 PUFFS BY MOUTH EVERY 6 HOURS IF NEEDED FOR WHEEZING OR SHORTNESS OF BREATH. USE WITH SPACER 18 g 2   No current facility-administered medications on file prior to visit.    Allergies  Allergen Reactions   Fluoxetine Hcl     REACTION: unspecified   Paroxetine     REACTION: unspecified   Poison Ivy Extract    Venlafaxine     REACTION: unspecified    Past Medical History:  Diagnosis Date   Anxiety    Asthma    Diverticulitis    GERD (gastroesophageal reflux disease)    Social anxiety disorder     Past Surgical History:  Procedure Laterality Date   BREAST ENHANCEMENT SURGERY  1987   CESAREAN SECTION      Family History  Problem Relation Age of Onset   Asthma Mother    COPD Mother    Diabetes Maternal Grandmother    Cancer Neg Hx     Social History   Socioeconomic History   Marital status: Divorced    Spouse name: Not on file   Number of children: 2  Years of education: Not on file   Highest education level: Not on file  Occupational History   Occupation: Disabled ---anxiety  Tobacco Use   Smoking status: Never    Passive exposure: Past   Smokeless tobacco: Never  Vaping Use   Vaping Use: Never used  Substance and Sexual Activity   Alcohol use: Yes   Drug use: No   Sexual activity: Not on file  Other Topics Concern   Not on file  Social History Narrative   No living will   Would want children to make decisions   Would accept resuscitation   Not sure about tube feeds    Social Determinants of Health   Financial Resource Strain: Not on file  Food Insecurity: Not on file  Transportation Needs: Not on file  Physical Activity: Not on file  Stress: Not on file  Social Connections: Not on file  Intimate Partner Violence: Not on file   Review of  Systems Does sleep okay with the temazepam Appetite is up---has gained a little more weight (trying smaller portions) Beer twice a week only     Objective:   Physical Exam Constitutional:      Appearance: Normal appearance.  Neurological:     Mental Status: She is alert.  Psychiatric:        Mood and Affect: Mood normal.        Behavior: Behavior normal.            Assessment & Plan:

## 2022-10-29 NOTE — Assessment & Plan Note (Signed)
Mostly seems to be secondary to GAD Is on the citalopram 20mg  Will set up with counselor again (Dr Rexene Edison again if she is taking patients)

## 2022-10-29 NOTE — Assessment & Plan Note (Signed)
Uses the buspirone 10 bid and temazepam 15-30 for sleep

## 2022-10-29 NOTE — Assessment & Plan Note (Signed)
PDMP reviewed No concerns 

## 2022-10-29 NOTE — Assessment & Plan Note (Signed)
Limited abduction and external rotation Will set up with ortho--Dr Mardelle Matte

## 2022-10-29 NOTE — Assessment & Plan Note (Signed)
PDMP rreviewed No concerns

## 2022-10-31 DIAGNOSIS — M7551 Bursitis of right shoulder: Secondary | ICD-10-CM | POA: Diagnosis not present

## 2022-11-07 ENCOUNTER — Other Ambulatory Visit: Payer: Self-pay | Admitting: Internal Medicine

## 2022-11-07 ENCOUNTER — Encounter: Payer: Self-pay | Admitting: Internal Medicine

## 2022-11-08 MED ORDER — TEMAZEPAM 15 MG PO CAPS: 15.0000 mg | ORAL_CAPSULE | Freq: Every evening | ORAL | 0 refills | Status: DC | PRN

## 2022-11-08 MED ORDER — HYDROCODONE-ACETAMINOPHEN 7.5-325 MG PO TABS: 1.0000 | ORAL_TABLET | Freq: Two times a day (BID) | ORAL | 0 refills | Status: DC | PRN

## 2022-11-08 NOTE — Telephone Encounter (Signed)
Name of Medication: Hydrocodone Name of Pharmacy:  [place] Last Written Date and Quantity: 10-03-22 #120  Last Office Visit and Type: 10-29-22 Next Office Visit and Type: 01-28-23 Last Controlled Substance Agreement Date: 01-30-22 Last UDS: 01-30-22   Temazepam last filled 10-03-22 #60

## 2022-11-28 DIAGNOSIS — M7551 Bursitis of right shoulder: Secondary | ICD-10-CM | POA: Diagnosis not present

## 2022-12-10 ENCOUNTER — Other Ambulatory Visit: Payer: Self-pay | Admitting: Internal Medicine

## 2022-12-10 MED ORDER — HYDROCODONE-ACETAMINOPHEN 7.5-325 MG PO TABS: 1.0000 | ORAL_TABLET | Freq: Two times a day (BID) | ORAL | 0 refills | Status: DC | PRN

## 2022-12-10 MED ORDER — TEMAZEPAM 15 MG PO CAPS: 15.0000 mg | ORAL_CAPSULE | Freq: Every evening | ORAL | 0 refills | Status: DC | PRN

## 2022-12-10 NOTE — Telephone Encounter (Signed)
Name of Medication: Hydrocodone Name of Pharmacy:  [place] Last Written Date and Quantity: 11-08-22 #120  Last Office Visit and Type: 10-29-22 Next Office Visit and Type: 01-28-23 Last Controlled Substance Agreement Date: 01-30-22 Last UDS: 01-30-22   Temazepam last filled 11-08-22 #60

## 2023-01-09 ENCOUNTER — Other Ambulatory Visit: Payer: Self-pay | Admitting: Internal Medicine

## 2023-01-09 MED ORDER — TEMAZEPAM 15 MG PO CAPS: 15.0000 mg | ORAL_CAPSULE | Freq: Every evening | ORAL | 0 refills | Status: DC | PRN

## 2023-01-09 MED ORDER — HYDROCODONE-ACETAMINOPHEN 7.5-325 MG PO TABS: 1.0000 | ORAL_TABLET | Freq: Two times a day (BID) | ORAL | 0 refills | Status: DC | PRN

## 2023-01-09 NOTE — Telephone Encounter (Signed)
Name of Medication: Hydrocodone Name of Pharmacy:  [place] Last Written Date and Quantity: 12-10-22 #120  Last Office Visit and Type: 10-29-22 Next Office Visit and Type: 01-28-23 Last Controlled Substance Agreement Date: 01-30-22 Last UDS: 01-30-22   Temazepam last filled 12-10-22 #60

## 2023-01-28 ENCOUNTER — Ambulatory Visit (INDEPENDENT_AMBULATORY_CARE_PROVIDER_SITE_OTHER): Payer: Medicare HMO | Admitting: Internal Medicine

## 2023-01-28 ENCOUNTER — Encounter: Payer: Self-pay | Admitting: Internal Medicine

## 2023-01-28 VITALS — BP 128/88 | HR 56 | Temp 97.5°F | Ht 65.25 in | Wt 140.0 lb

## 2023-01-28 DIAGNOSIS — F3341 Major depressive disorder, recurrent, in partial remission: Secondary | ICD-10-CM | POA: Diagnosis not present

## 2023-01-28 DIAGNOSIS — Z0000 Encounter for general adult medical examination without abnormal findings: Secondary | ICD-10-CM

## 2023-01-28 DIAGNOSIS — F1120 Opioid dependence, uncomplicated: Secondary | ICD-10-CM | POA: Diagnosis not present

## 2023-01-28 DIAGNOSIS — K2100 Gastro-esophageal reflux disease with esophagitis, without bleeding: Secondary | ICD-10-CM

## 2023-01-28 DIAGNOSIS — F1320 Sedative, hypnotic or anxiolytic dependence, uncomplicated: Secondary | ICD-10-CM | POA: Diagnosis not present

## 2023-01-28 DIAGNOSIS — F411 Generalized anxiety disorder: Secondary | ICD-10-CM

## 2023-01-28 DIAGNOSIS — M545 Low back pain, unspecified: Secondary | ICD-10-CM

## 2023-01-28 DIAGNOSIS — G8929 Other chronic pain: Secondary | ICD-10-CM

## 2023-01-28 LAB — CBC
HCT: 42.1 % (ref 36.0–46.0)
Hemoglobin: 14.1 g/dL (ref 12.0–15.0)
MCHC: 33.4 g/dL (ref 30.0–36.0)
MCV: 96.5 fl (ref 78.0–100.0)
Platelets: 253 10*3/uL (ref 150.0–400.0)
RBC: 4.37 Mil/uL (ref 3.87–5.11)
RDW: 13.3 % (ref 11.5–15.5)
WBC: 7.3 10*3/uL (ref 4.0–10.5)

## 2023-01-28 LAB — COMPREHENSIVE METABOLIC PANEL
ALT: 14 U/L (ref 0–35)
AST: 20 U/L (ref 0–37)
Albumin: 4.4 g/dL (ref 3.5–5.2)
Alkaline Phosphatase: 63 U/L (ref 39–117)
BUN: 15 mg/dL (ref 6–23)
CO2: 29 mEq/L (ref 19–32)
Calcium: 9.5 mg/dL (ref 8.4–10.5)
Chloride: 101 mEq/L (ref 96–112)
Creatinine, Ser: 0.8 mg/dL (ref 0.40–1.20)
GFR: 78.63 mL/min (ref >60.00)
Glucose, Bld: 97 mg/dL (ref 70–99)
Potassium: 4.3 mEq/L (ref 3.5–5.1)
Sodium: 137 mEq/L (ref 135–145)
Total Bilirubin: 0.3 mg/dL (ref 0.2–1.2)
Total Protein: 7.3 g/dL (ref 6.0–8.3)

## 2023-01-28 LAB — TSH: TSH: 1.16 u[IU]/mL (ref 0.35–5.50)

## 2023-01-28 MED ORDER — OMEPRAZOLE 20 MG PO CPDR: 20.0000 mg | DELAYED_RELEASE_CAPSULE | Freq: Every day | ORAL | 3 refills | Status: DC

## 2023-01-28 NOTE — Progress Notes (Signed)
Vision Screening   Right eye Left eye Both eyes  Without correction     With correction  Hearing Screening - Comments:: Passed whisper test

## 2023-01-28 NOTE — Assessment & Plan Note (Signed)
I have personally reviewed the Medicare Annual Wellness questionnaire and have noted 1. The patient's medical and social history 2. Their use of alcohol, tobacco or illicit drugs 3. Their current medications and supplements 4. The patient's functional ability including ADL's, fall risks, home safety risks and hearing or visual             impairment. 5. Diet and physical activities 6. Evidence for depression or mood disorders  The patients weight, height, BMI and visual acuity have been recorded in the chart I have made referrals, counseling and provided education to the patient based review of the above and I have provided the pt with a written personalized care plan for preventive services.  I have provided you with a copy of your personalized plan for preventive services. Please take the time to review along with your updated medication list.  Discussed increasing exercise She will set up mammogram at our mobile site Colon due 2027 One last pap in 3-4 years Td at pharmacy Flu/COVID vaccines this fall

## 2023-01-28 NOTE — Assessment & Plan Note (Signed)
Mild dysphagia again--but had stopped the omeprazole I sent refill to restart daily

## 2023-01-28 NOTE — Progress Notes (Signed)
Subjective:    Patient ID: Rachel Vaughn, female    DOB: Feb 05, 1961, 62 y.o.   MRN: [id number]  HPI Here for Medicare wellness visit and follow up of chronic health conditions Reviewed advanced directives Reviewed other doctors---Dr Albesa Seen,  No hospitalizations or surgery in the past year Vision is fine---still hasn't gotten eye doctor here Hearing is fine Drinks beer 2-3 days a week-- it does relax her  No tobacco Not exercising much--but some No falls Only occasional depression---mostly anxiety. 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) Affordable and will be closer to kids Had just been renting locally---landlord treating them well with the move  Mood is generally okay ---just having stress Worried "about the little things" Called to try to set up with psychologist--but it didn't happen  Not really exercising Ongoing back pain--medication does help (does once a month shopping and cat food is delivered) Does all her own instrumental ADLs  Current Outpatient Medications on File Prior to Visit  Medication Sig Dispense Refill   busPIRone (BUSPAR) 10 MG tablet TAKE 1 TABLET BY MOUTH TWICE DAILY 180 tablet 3   cetirizine (ZYRTEC) 10 MG tablet Take 10 mg by mouth daily.     citalopram (CELEXA) 20 MG tablet TAKE 1 TABLET BY MOUTH EVERY DAY 90 tablet 3   diclofenac sodium (VOLTAREN) 1 % GEL Apply 2 g topically 4 (four) times daily as needed. To hands 200 g 11   fluticasone (FLONASE) 50 MCG/ACT nasal spray Place 2 sprays into both nostrils daily.     HYDROcodone-acetaminophen (NORCO) 7.5-325 MG tablet Take 1-2 tablets by mouth 2 (two) times daily as needed. 120 tablet 0   hydrocortisone 2.5 % cream Apply topically 3 (three) times daily as needed. 28 g 3   omeprazole (PRILOSEC) 20 MG capsule TAKE 1 CAPSULE BY MOUTH 2 TIMES A DAY BEFORE MEALS 60 capsule 11   temazepam  (RESTORIL) 15 MG capsule Take 1-2 capsules (15-30 mg total) by mouth at bedtime as needed for sleep. 60 capsule 0   tiZANidine (ZANAFLEX) 2 MG tablet Take 1 tablet (2 mg total) by mouth every 6 (six) hours as needed for muscle spasms. 30 tablet 1   tretinoin (RETIN-A) 0.01 % gel Apply topically at bedtime. 45 g 0   triamcinolone cream (KENALOG) 0.1 % apply to affected area twice a day AS NEEDED 30 g 2   valACYclovir (VALTREX) 1000 MG tablet Take 1 tablet (1,000 mg total) by mouth 2 (two) times daily as needed. 30 tablet 1   VENTOLIN HFA 108 (90 Base) MCG/ACT inhaler INHALE 2 PUFFS BY MOUTH EVERY 6 HOURS IF NEEDED FOR WHEEZING OR SHORTNESS OF BREATH. USE WITH SPACER 18 g 2   No current facility-administered medications on file prior to visit.    Allergies  Allergen Reactions   Fluoxetine Hcl     REACTION: unspecified   Paroxetine     REACTION: unspecified   Poison Ivy Extract    Venlafaxine     REACTION: unspecified    Past Medical History:  Diagnosis Date   Anxiety    Asthma    Diverticulitis    GERD (gastroesophageal reflux disease)    Social anxiety disorder     Past Surgical History:  Procedure Laterality Date   BREAST ENHANCEMENT SURGERY  1987   CESAREAN SECTION      Family History  Problem Relation Age of Onset   Asthma Mother  COPD Mother    Diabetes Maternal Grandmother    Cancer Neg Hx     Social History   Socioeconomic History   Marital status: Divorced    Spouse name: Not on file   Number of children: 2   Years of education: Not on file   Highest education level: Not on file  Occupational History   Occupation: Disabled ---anxiety  Tobacco Use   Smoking status: Never    Passive exposure: Past   Smokeless tobacco: Never  Vaping Use   Vaping Use: Never used  Substance and Sexual Activity   Alcohol use: Yes   Drug use: No   Sexual activity: Not on file  Other Topics Concern   Not on file  Social History Narrative   No living will   Would  want children to make decisions   Would accept resuscitation   Not sure about tube feeds    Social Determinants of Health   Financial Resource Strain: Not on file  Food Insecurity: Not on file  Transportation Needs: Not on file  Physical Activity: Not on file  Stress: Not on file  Social Connections: Not on file  Intimate Partner Violence: Not on file   Review of Systems Very strong appetite Weight fairly stable--but distresses her since too high Sleeps okay Wears seat belt Some pain with bulge on right lateral abdomen Bowels move okay--no blood (other than from hemorrhoid) Does have heartburn--ran out of prilosec. No dysphagia Teeth seem fine---some gum issues. Needs to establish with dentist No chest pain or SOB No dizziness or syncope No edema Voids fine---some slight stress incontinence (no pad needed)    Objective:   Physical Exam Constitutional:      Appearance: Normal appearance.  HENT:     Mouth/Throat:     Pharynx: No oropharyngeal exudate or posterior oropharyngeal erythema.  Eyes:     Conjunctiva/sclera: Conjunctivae normal.     Pupils: Pupils are equal, round, and reactive to light.  Cardiovascular:     Rate and Rhythm: Normal rate and regular rhythm.     Pulses: Normal pulses.     Heart sounds: No murmur heard.    No gallop.  Pulmonary:     Effort: Pulmonary effort is normal.     Breath sounds: Normal breath sounds. No wheezing or rales.  Abdominal:     Palpations: Abdomen is soft.     Tenderness: There is no abdominal tenderness.     Comments: Bulge to right of umbilicus---no clear hernia  Musculoskeletal:     Cervical back: Neck supple.     Right lower leg: No edema.     Left lower leg: No edema.  Lymphadenopathy:     Cervical: No cervical adenopathy.  Skin:    Findings: No rash.  Neurological:     General: No focal deficit present.     Mental Status: She is alert and oriented to person, place, and time.     Comments: Word naming 12/ 1  minute Recall 2/3  Psychiatric:        Mood and Affect: Mood normal.        Behavior: Behavior normal.            Assessment & Plan:

## 2023-01-28 NOTE — Assessment & Plan Note (Signed)
Mostly has reactive depression due to the anxiety Is on the citalopram 20

## 2023-01-28 NOTE — Assessment & Plan Note (Signed)
PDMP reviewed No concerns 

## 2023-01-28 NOTE — Assessment & Plan Note (Signed)
Ongoing severe anxiety Limits going out, etc Buspar 10 bid, citalopram, temazepam for bedtime (and sometimes in the morning)

## 2023-01-28 NOTE — Assessment & Plan Note (Signed)
Chronic and ongoing  Stays independent with the hydrocodone

## 2023-01-28 NOTE — Assessment & Plan Note (Signed)
PDMP reviewed Temazepam helps sleep

## 2023-02-08 ENCOUNTER — Other Ambulatory Visit: Payer: Self-pay | Admitting: Internal Medicine

## 2023-02-08 MED ORDER — TEMAZEPAM 15 MG PO CAPS: 15.0000 mg | ORAL_CAPSULE | Freq: Every evening | ORAL | 0 refills | Status: DC | PRN

## 2023-02-08 MED ORDER — HYDROCODONE-ACETAMINOPHEN 7.5-325 MG PO TABS: 1.0000 | ORAL_TABLET | Freq: Two times a day (BID) | ORAL | 0 refills | Status: DC | PRN

## 2023-02-08 NOTE — Telephone Encounter (Signed)
Name of Medication: Hydrocodone Name of Pharmacy:  [facility] Last Written Date and Quantity: 01-09-23 #120  Last Office Visit and Type: 01-28-23 Next Office Visit and Type: 04-29-23 Last Controlled Substance Agreement Date: 01-30-22 Last UDS: 01-30-22   Temazepam last filled 01-09-23 #60

## 2023-02-14 ENCOUNTER — Encounter: Payer: Self-pay | Admitting: Internal Medicine

## 2023-02-15 MED ORDER — VALACYCLOVIR HCL 1 G PO TABS: 1000.0000 mg | ORAL_TABLET | Freq: Two times a day (BID) | ORAL | 1 refills | Status: AC | PRN

## 2023-02-27 DIAGNOSIS — Z1231 Encounter for screening mammogram for malignant neoplasm of breast: Secondary | ICD-10-CM | POA: Diagnosis not present

## 2023-02-27 LAB — HM MAMMOGRAPHY

## 2023-03-06 ENCOUNTER — Other Ambulatory Visit: Payer: Self-pay | Admitting: Internal Medicine

## 2023-03-06 ENCOUNTER — Encounter: Payer: Self-pay | Admitting: Internal Medicine

## 2023-03-06 MED ORDER — TEMAZEPAM 15 MG PO CAPS: 15.0000 mg | ORAL_CAPSULE | Freq: Every evening | ORAL | 0 refills | Status: DC | PRN

## 2023-03-06 MED ORDER — HYDROCODONE-ACETAMINOPHEN 7.5-325 MG PO TABS: 1.0000 | ORAL_TABLET | Freq: Two times a day (BID) | ORAL | 0 refills | Status: DC | PRN

## 2023-03-07 MED ORDER — TEMAZEPAM 15 MG PO CAPS: 15.0000 mg | ORAL_CAPSULE | Freq: Every evening | ORAL | 0 refills | Status: DC | PRN

## 2023-03-07 MED ORDER — HYDROCODONE-ACETAMINOPHEN 7.5-325 MG PO TABS: 1.0000 | ORAL_TABLET | Freq: Two times a day (BID) | ORAL | 0 refills | Status: DC | PRN

## 2023-03-12 ENCOUNTER — Encounter: Payer: Self-pay | Admitting: Internal Medicine

## 2023-03-14 DIAGNOSIS — H524 Presbyopia: Secondary | ICD-10-CM | POA: Diagnosis not present

## 2023-03-15 ENCOUNTER — Telehealth: Payer: Self-pay

## 2023-03-15 ENCOUNTER — Encounter: Payer: Self-pay | Admitting: Internal Medicine

## 2023-03-15 DIAGNOSIS — N950 Postmenopausal bleeding: Secondary | ICD-10-CM

## 2023-03-15 NOTE — Telephone Encounter (Signed)
Prior [employer] - [place] Case ID: [id number] completed on Cover My Meds. Waiting on response. Can take up to 72 hours but it was sent urgent.

## 2023-03-19 NOTE — Telephone Encounter (Signed)
New form filled out and placed in Dr Karle Starch inbox

## 2023-03-25 NOTE — Telephone Encounter (Signed)
You asked for this drug for 30 capsules per 30 days, which has been approved for/through 10/08/2023 by the [employer] Non-Formulary Exceptions Pharmacy Coverage Policy. However, this drug has also rejected due to a high quantity. The information we have says the pharmacy is trying to fill 60 capsules per 30 days. Please check that the pharmacy is filling the dosage written by your provider. This decision was from [facility] Quantity Limitation Coverage Policy

## 2023-03-29 ENCOUNTER — Encounter: Payer: Self-pay | Admitting: *Deleted

## 2023-04-09 ENCOUNTER — Other Ambulatory Visit: Payer: Self-pay | Admitting: Internal Medicine

## 2023-04-09 MED ORDER — TEMAZEPAM 15 MG PO CAPS: 15.0000 mg | ORAL_CAPSULE | Freq: Every evening | ORAL | 0 refills | Status: DC | PRN

## 2023-04-09 MED ORDER — HYDROCODONE-ACETAMINOPHEN 7.5-325 MG PO TABS: 1.0000 | ORAL_TABLET | Freq: Two times a day (BID) | ORAL | 0 refills | Status: DC | PRN

## 2023-04-09 NOTE — Telephone Encounter (Signed)
Last office visit 01/28/23 for MWV.  Last refilled Norco 03/07/2023 for #120 with no refills.  Temazepam 03/07/2023 for #60 with no refills.  Next Appt: 04/29/23 for 3 month follow up.  UDS/Contract 01/30/2022.

## 2023-04-29 ENCOUNTER — Ambulatory Visit (INDEPENDENT_AMBULATORY_CARE_PROVIDER_SITE_OTHER): Payer: Medicare HMO | Admitting: Internal Medicine

## 2023-04-29 ENCOUNTER — Encounter: Payer: Self-pay | Admitting: Internal Medicine

## 2023-04-29 VITALS — BP 118/82 | HR 56 | Temp 97.2°F | Ht 65.25 in | Wt 139.0 lb

## 2023-04-29 DIAGNOSIS — G8929 Other chronic pain: Secondary | ICD-10-CM | POA: Diagnosis not present

## 2023-04-29 DIAGNOSIS — F1120 Opioid dependence, uncomplicated: Secondary | ICD-10-CM | POA: Diagnosis not present

## 2023-04-29 DIAGNOSIS — M545 Low back pain, unspecified: Secondary | ICD-10-CM

## 2023-04-29 NOTE — Assessment & Plan Note (Signed)
PDMP reviewed No concerns Contract redone Will check UDS

## 2023-04-29 NOTE — Assessment & Plan Note (Signed)
Ongoing pain issues Uses the hydrocodone 7.5/325---up to four daily

## 2023-04-29 NOTE — Progress Notes (Signed)
Subjective:    Patient ID: Rachel Vaughn, female    DOB: 13-Sep-1961, 62 y.o.   MRN: [id number]  HPI Here for follow up of chronic pain and anxiety  Did make her move to [place]--apt style condo Some stress as expected--but now starting to hang things on walls, etc Now closer to family Did see granddaughters ---almost 11 now  Back pain does seem to worsen Now having pain with both hips--mostly the left one (will seem to even give out briefly) The hips will make the back worse  Has found new [employer]--and isn't busy (that is good) Actually comfortable going more often Not out much otherwise Is going to meet her former landlady in [place] have become friends  Current Outpatient Medications on File Prior to Visit  Medication Sig Dispense Refill   busPIRone (BUSPAR) 10 MG tablet TAKE 1 TABLET BY MOUTH TWICE DAILY 180 tablet 3   cetirizine (ZYRTEC) 10 MG tablet Take 10 mg by mouth daily.     citalopram (CELEXA) 20 MG tablet TAKE 1 TABLET BY MOUTH EVERY DAY 90 tablet 3   diclofenac sodium (VOLTAREN) 1 % GEL Apply 2 g topically 4 (four) times daily as needed. To hands 200 g 11   fluticasone (FLONASE) 50 MCG/ACT nasal spray Place 2 sprays into both nostrils daily.     HYDROcodone-acetaminophen (NORCO) 7.5-325 MG tablet Take 1-2 tablets by mouth 2 (two) times daily as needed. 120 tablet 0   hydrocortisone 2.5 % cream Apply topically 3 (three) times daily as needed. 28 g 3   omeprazole (PRILOSEC) 20 MG capsule Take 1 capsule (20 mg total) by mouth daily. 90 capsule 3   temazepam (RESTORIL) 15 MG capsule Take 1-2 capsules (15-30 mg total) by mouth at bedtime as needed for sleep. 60 capsule 0   tiZANidine (ZANAFLEX) 2 MG tablet Take 1 tablet (2 mg total) by mouth every 6 (six) hours as needed for muscle spasms. 30 tablet 1   tretinoin (RETIN-A) 0.01 % gel Apply topically at bedtime. 45 g 0   triamcinolone cream (KENALOG) 0.1 % apply to affected area twice a day AS NEEDED 30 g 2    valACYclovir (VALTREX) 1000 MG tablet Take 1 tablet (1,000 mg total) by mouth 2 (two) times daily as needed. 30 tablet 1   VENTOLIN HFA 108 (90 Base) MCG/ACT inhaler INHALE 2 PUFFS BY MOUTH EVERY 6 HOURS IF NEEDED FOR WHEEZING OR SHORTNESS OF BREATH. USE WITH SPACER 18 g 2   No current facility-administered medications on file prior to visit.    Allergies  Allergen Reactions   Fluoxetine Hcl     REACTION: unspecified   Paroxetine     REACTION: unspecified   Poison Ivy Extract    Venlafaxine     REACTION: unspecified    Past Medical History:  Diagnosis Date   Anxiety    Asthma    Diverticulitis    GERD (gastroesophageal reflux disease)    Social anxiety disorder     Past Surgical History:  Procedure Laterality Date   BREAST ENHANCEMENT SURGERY  1987   CESAREAN SECTION      Family History  Problem Relation Age of Onset   Asthma Mother    COPD Mother    Diabetes Maternal Grandmother    Cancer Neg Hx     Social History   Socioeconomic History   Marital status: Divorced    Spouse name: Not on file   Number of children: 2   Years of  education: Not on file   Highest education level: Not on file  Occupational History   Occupation: Disabled ---anxiety  Tobacco Use   Smoking status: Never    Passive exposure: Past   Smokeless tobacco: Never  Vaping Use   Vaping status: Never Used  Substance and Sexual Activity   Alcohol use: Yes   Drug use: No   Sexual activity: Not on file  Other Topics Concern   Not on file  Social History Narrative   No living will   Would want children to make decisions   Would accept resuscitation   Not sure about tube feeds    Social Determinants of Health   Financial Resource Strain: Not on file  Food Insecurity: Not on file  Transportation Needs: Not on file  Physical Activity: Not on file  Stress: Not on file  Social Connections: Not on file  Intimate Partner Violence: Not At Risk (05/24/2022)   [facility]    Threatened: Not on file    Insulted: Not on file    Physically Hurt : Not on file    Scream: Not on file   Review of Systems Sleeping okay Appetite is strong--but weight stable (has to work to avoid sweets)     Objective:   Physical Exam Constitutional:      Appearance: Normal appearance.  Musculoskeletal:     Comments: Fairly normal rotation in both hips--just stiff trying to abduct hips (is planning to try yoga to work on muscles)  Neurological:     Mental Status: She is alert.            Assessment & Plan:

## 2023-05-01 LAB — DRUG MONITORING, PANEL 8 WITH CONFIRMATION, URINE
6 Acetylmorphine: NEGATIVE ng/mL (ref <10)
Alcohol Metabolites: NEGATIVE ng/mL (ref <500)
Alphahydroxyalprazolam: NEGATIVE ng/mL (ref <25)
Alphahydroxymidazolam: NEGATIVE ng/mL (ref <50)
Alphahydroxytriazolam: NEGATIVE ng/mL (ref <50)
Aminoclonazepam: NEGATIVE ng/mL (ref <25)
Amphetamines: NEGATIVE ng/mL (ref <500)
Benzodiazepines: POSITIVE ng/mL — AB (ref <100)
Buprenorphine, Urine: NEGATIVE ng/mL (ref <5)
Cocaine Metabolite: NEGATIVE ng/mL (ref <150)
Codeine: NEGATIVE ng/mL (ref <50)
Creatinine: 181.2 mg/dL (ref >=20.0)
Hydrocodone: 1942 ng/mL — ABNORMAL HIGH (ref <50)
Hydromorphone: 125 ng/mL — ABNORMAL HIGH (ref <50)
Hydroxyethylflurazepam: NEGATIVE ng/mL (ref <50)
Lorazepam: NEGATIVE ng/mL (ref <50)
MDMA: NEGATIVE ng/mL (ref <500)
Marijuana Metabolite: NEGATIVE ng/mL (ref <20)
Morphine: NEGATIVE ng/mL (ref <50)
Nordiazepam: NEGATIVE ng/mL (ref <50)
Norhydrocodone: 5598 ng/mL — ABNORMAL HIGH (ref <50)
Opiates: POSITIVE ng/mL — AB (ref <100)
Oxazepam: 3011 ng/mL — ABNORMAL HIGH (ref <50)
Oxidant: NEGATIVE ug/mL (ref <200)
Oxycodone: NEGATIVE ng/mL (ref <100)
Temazepam: >6250 ng/mL — ABNORMAL HIGH (ref <50)
pH: 6.7 (ref 4.5–9.0)

## 2023-05-01 LAB — DM TEMPLATE

## 2023-05-10 ENCOUNTER — Other Ambulatory Visit: Payer: Self-pay | Admitting: Internal Medicine

## 2023-05-10 NOTE — Telephone Encounter (Signed)
Name of Medication: Hydrocodone Name of Pharmacy:  [facility] Last Written Date and Quantity: 04-09-23 #120  Last Office Visit and Type: 04-29-23 Next Office Visit and Type: 07-30-23 Last Controlled Substance Agreement Date: 04-29-23 Last UDS: 04-29-23   Temazepam last filled 04-09-23 #60

## 2023-05-10 NOTE — Telephone Encounter (Signed)
Prescription Request  05/10/2023  LOV: 04/29/2023  What is the name of the medication or equipment? temazepam (RESTORIL) 15 MG capsule  AND  HYDROcodone-acetaminophen (NORCO) 7.5-325 MG tablet   Have you contacted your pharmacy to request a refill? No , tried to request on MyChart like she normally does and it was not working   Which pharmacy would you like this sent to?  [id number] - [place], [place] - [address]   Patient notified that their request is being sent to the clinical staff for review and that they should receive a response within 2 business days.   Please advise at Mobile [phone] (mobile)

## 2023-05-11 MED ORDER — HYDROCODONE-ACETAMINOPHEN 7.5-325 MG PO TABS: 1.0000 | ORAL_TABLET | Freq: Two times a day (BID) | ORAL | 0 refills | Status: DC | PRN

## 2023-05-11 MED ORDER — TEMAZEPAM 15 MG PO CAPS: 15.0000 mg | ORAL_CAPSULE | Freq: Every evening | ORAL | 0 refills | Status: DC | PRN

## 2023-05-12 DIAGNOSIS — Z0100 Encounter for examination of eyes and vision without abnormal findings: Secondary | ICD-10-CM | POA: Diagnosis not present

## 2023-06-11 ENCOUNTER — Other Ambulatory Visit: Payer: Self-pay | Admitting: Internal Medicine

## 2023-06-12 MED ORDER — CETIRIZINE HCL 10 MG PO TABS: 10.0000 mg | ORAL_TABLET | Freq: Every day | ORAL | 2 refills | Status: DC

## 2023-06-12 MED ORDER — HYDROCODONE-ACETAMINOPHEN 7.5-325 MG PO TABS: 1.0000 | ORAL_TABLET | Freq: Two times a day (BID) | ORAL | 0 refills | Status: DC | PRN

## 2023-06-12 MED ORDER — TEMAZEPAM 15 MG PO CAPS: 15.0000 mg | ORAL_CAPSULE | Freq: Every evening | ORAL | 0 refills | Status: DC | PRN

## 2023-06-12 NOTE — Telephone Encounter (Signed)
Refill request for HYDROcodone-acetaminophen (NORCO) 7.5-325 MG tablet and  temazepam (RESTORIL) 15 MG capsule   LOV - 04/29/23 Next OV - 07/30/23 Last refill - Temazepam 05/11/23 #60/0                   Hydrocodone 05/11/23 #120/0

## 2023-07-10 ENCOUNTER — Other Ambulatory Visit: Payer: Self-pay | Admitting: Internal Medicine

## 2023-07-11 MED ORDER — HYDROCODONE-ACETAMINOPHEN 7.5-325 MG PO TABS: 1.0000 | ORAL_TABLET | Freq: Two times a day (BID) | ORAL | 0 refills | Status: DC | PRN

## 2023-07-11 MED ORDER — TEMAZEPAM 15 MG PO CAPS: 15.0000 mg | ORAL_CAPSULE | Freq: Every evening | ORAL | 0 refills | Status: DC | PRN

## 2023-07-11 NOTE — Telephone Encounter (Signed)
Name of Medication: Hydrocodone Name of Pharmacy:  [facility] Last Written Date and Quantity: 06-12-23 #120  Last Office Visit and Type: 04-29-23 Next Office Visit and Type: 07-30-23 Last Controlled Substance Agreement Date: 04-29-23 Last UDS: 04-29-23   Temazepam last filled 06-12-23 #60

## 2023-07-30 ENCOUNTER — Encounter: Payer: Self-pay | Admitting: Internal Medicine

## 2023-07-30 ENCOUNTER — Ambulatory Visit: Payer: Medicare HMO | Admitting: Internal Medicine

## 2023-07-30 VITALS — BP 108/70 | HR 55 | Temp 97.9°F | Ht 65.25 in | Wt 142.0 lb

## 2023-07-30 DIAGNOSIS — M545 Low back pain, unspecified: Secondary | ICD-10-CM | POA: Diagnosis not present

## 2023-07-30 DIAGNOSIS — F3341 Major depressive disorder, recurrent, in partial remission: Secondary | ICD-10-CM

## 2023-07-30 DIAGNOSIS — F1120 Opioid dependence, uncomplicated: Secondary | ICD-10-CM

## 2023-07-30 DIAGNOSIS — G8929 Other chronic pain: Secondary | ICD-10-CM | POA: Diagnosis not present

## 2023-07-30 NOTE — Assessment & Plan Note (Signed)
PDMP reviewed No concerns 

## 2023-07-30 NOTE — Progress Notes (Signed)
Subjective:    Patient ID: Rachel Vaughn, female    DOB: September 14, 1961, 62 y.o.   MRN: [id number]  HPI Here for follow up of chronic pain and anxiety---and medication dependence  Doing okay Settling in at new place in [place] Does go out to grocery store Will walk around some in neighborhood Considering going on [facility] board!!  Ongoing anxiety--but stable Back pain is about the same Right shoulder pain is coming back--may want to consider another injection Diclofenac gel didn't help  Current Outpatient Medications on File Prior to Visit  Medication Sig Dispense Refill   busPIRone (BUSPAR) 10 MG tablet TAKE 1 TABLET BY MOUTH TWICE DAILY 180 tablet 3   cetirizine (ZYRTEC) 10 MG tablet Take 1 tablet (10 mg total) by mouth daily. 30 tablet 2   citalopram (CELEXA) 20 MG tablet TAKE 1 TABLET BY MOUTH EVERY DAY 90 tablet 3   diclofenac sodium (VOLTAREN) 1 % GEL Apply 2 g topically 4 (four) times daily as needed. To hands 200 g 11   fluticasone (FLONASE) 50 MCG/ACT nasal spray Place 2 sprays into both nostrils daily.     HYDROcodone-acetaminophen (NORCO) 7.5-325 MG tablet Take 1-2 tablets by mouth 2 (two) times daily as needed. 120 tablet 0   hydrocortisone 2.5 % cream Apply topically 3 (three) times daily as needed. 28 g 3   omeprazole (PRILOSEC) 20 MG capsule Take 1 capsule (20 mg total) by mouth daily. 90 capsule 3   temazepam (RESTORIL) 15 MG capsule Take 1-2 capsules (15-30 mg total) by mouth at bedtime as needed for sleep. 60 capsule 0   tiZANidine (ZANAFLEX) 2 MG tablet Take 1 tablet (2 mg total) by mouth every 6 (six) hours as needed for muscle spasms. 30 tablet 1   tretinoin (RETIN-A) 0.01 % gel Apply topically at bedtime. 45 g 0   triamcinolone cream (KENALOG) 0.1 % apply to affected area twice a day AS NEEDED 30 g 2   valACYclovir (VALTREX) 1000 MG tablet Take 1 tablet (1,000 mg total) by mouth 2 (two) times daily as needed. 30 tablet 1   VENTOLIN HFA 108 (90 Base) MCG/ACT inhaler  INHALE 2 PUFFS BY MOUTH EVERY 6 HOURS IF NEEDED FOR WHEEZING OR SHORTNESS OF BREATH. USE WITH SPACER 18 g 2   No current facility-administered medications on file prior to visit.    Allergies  Allergen Reactions   Fluoxetine Hcl     REACTION: unspecified   Paroxetine     REACTION: unspecified   Poison Ivy Extract    Venlafaxine     REACTION: unspecified    Past Medical History:  Diagnosis Date   Anxiety    Asthma    Diverticulitis    GERD (gastroesophageal reflux disease)    Social anxiety disorder     Past Surgical History:  Procedure Laterality Date   BREAST ENHANCEMENT SURGERY  1987   CESAREAN SECTION      Family History  Problem Relation Age of Onset   Asthma Mother    COPD Mother    Diabetes Maternal Grandmother    Cancer Neg Hx     Social History   Socioeconomic History   Marital status: Divorced    Spouse name: Not on file   Number of children: 2   Years of education: Not on file   Highest education level: Not on file  Occupational History   Occupation: Disabled ---anxiety  Tobacco Use   Smoking status: Never    Passive exposure: Past  Smokeless tobacco: Never  Vaping Use   Vaping status: Never Used  Substance and Sexual Activity   Alcohol use: Yes   Drug use: No   Sexual activity: Not on file  Other Topics Concern   Not on file  Social History Narrative   No living will   Would want children to make decisions   Would accept resuscitation   Not sure about tube feeds    Social Determinants of Health   Financial Resource Strain: Not on file  Food Insecurity: Not on file  Transportation Needs: Not on file  Physical Activity: Not on file  Stress: Not on file  Social Connections: Not on file  Intimate Partner Violence: Not At Risk (05/24/2022)   [facility]    Threatened: Not on file    Insulted: Not on file    Physically Hurt : Not on file    Scream: Not on file   Review of Systems Has  gained 10# in the past 2 years Regular beer--discussed limiting Cooks meals--fairly prudent    Objective:   Physical Exam Constitutional:      Appearance: Normal appearance.  Neurological:     Mental Status: She is alert.  Psychiatric:        Mood and Affect: Mood normal.        Behavior: Behavior normal.            Assessment & Plan:

## 2023-07-30 NOTE — Assessment & Plan Note (Signed)
Has been stable Also with anxiety On buspar 10 bid and the citalopram 20 Temazepam for sleep

## 2023-07-30 NOTE — Assessment & Plan Note (Signed)
Has maintained function Continues to use the norco 7.5/325 up to four a day

## 2023-08-05 ENCOUNTER — Encounter: Payer: Self-pay | Admitting: Internal Medicine

## 2023-08-14 ENCOUNTER — Other Ambulatory Visit: Payer: Self-pay | Admitting: Internal Medicine

## 2023-08-14 MED ORDER — HYDROCODONE-ACETAMINOPHEN 7.5-325 MG PO TABS: 1.0000 | ORAL_TABLET | Freq: Two times a day (BID) | ORAL | 0 refills | Status: DC | PRN

## 2023-08-14 MED ORDER — TEMAZEPAM 15 MG PO CAPS: 15.0000 mg | ORAL_CAPSULE | Freq: Every evening | ORAL | 0 refills | Status: DC | PRN

## 2023-08-14 NOTE — Telephone Encounter (Signed)
Name of Medication: Hydrocodone Name of Pharmacy:  [facility] Last Written Date and Quantity: 07-11-23 #120  Last Office Visit and Type: 07-30-23 Next Office Visit and Type: 10-31-23 Last Controlled Substance Agreement Date: 04-29-23 Last UDS: 04-29-23   Temazepam last filled 06-12-23 #60

## 2023-08-22 ENCOUNTER — Encounter: Payer: Self-pay | Admitting: Internal Medicine

## 2023-08-22 MED ORDER — CITALOPRAM HYDROBROMIDE 20 MG PO TABS: 20.0000 mg | ORAL_TABLET | Freq: Every day | ORAL | 3 refills | Status: DC

## 2023-09-11 ENCOUNTER — Other Ambulatory Visit: Payer: Self-pay | Admitting: Internal Medicine

## 2023-09-11 NOTE — Telephone Encounter (Signed)
Name of Medication: Hydrocodone Name of Pharmacy:  [facility] Last Written Date and Quantity: 08-14-23 #120  Last Office Visit and Type: 07-30-23 Next Office Visit and Type: 10-31-23 Last Controlled Substance Agreement Date: 04-29-23 Last UDS: 04-29-23   Temazepam last filled 08-14-23 #60

## 2023-09-12 MED ORDER — TEMAZEPAM 15 MG PO CAPS: 15.0000 mg | ORAL_CAPSULE | Freq: Every evening | ORAL | 0 refills | Status: DC | PRN

## 2023-09-12 MED ORDER — HYDROCODONE-ACETAMINOPHEN 7.5-325 MG PO TABS: 1.0000 | ORAL_TABLET | Freq: Two times a day (BID) | ORAL | 0 refills | Status: DC | PRN

## 2023-09-19 ENCOUNTER — Telehealth: Payer: Self-pay | Admitting: Emergency Medicine

## 2023-09-19 ENCOUNTER — Encounter: Payer: Self-pay | Admitting: Internal Medicine

## 2023-09-19 ENCOUNTER — Ambulatory Visit
Admission: RE | Admit: 2023-09-19 | Discharge: 2023-09-19 | Disposition: A | Discharge status: Home / Self Care | Payer: Medicare HMO | Source: Ambulatory Visit | Attending: Emergency Medicine | Admitting: Emergency Medicine

## 2023-09-19 ENCOUNTER — Ambulatory Visit: Payer: Medicare HMO

## 2023-09-19 VITALS — BP 156/77 | HR 69 | Temp 98.0°F | Resp 16

## 2023-09-19 DIAGNOSIS — M533 Sacrococcygeal disorders, not elsewhere classified: Secondary | ICD-10-CM

## 2023-09-19 DIAGNOSIS — M160 Bilateral primary osteoarthritis of hip: Secondary | ICD-10-CM | POA: Diagnosis not present

## 2023-09-19 MED ORDER — PREDNISONE 10 MG (21) PO TBPK
ORAL_TABLET | Freq: Every day | ORAL | 0 refills | Status: DC
Start: 2023-09-19 — End: 2023-10-31

## 2023-09-19 MED ORDER — KETOROLAC TROMETHAMINE 30 MG/ML IJ SOLN
30.0000 mg | Freq: Once | INTRAMUSCULAR | Status: AC
  Administered 2023-09-19: 30 mg via INTRAMUSCULAR

## 2023-09-19 NOTE — ED Provider Notes (Signed)
[facility]    CSN: [id number] Arrival date & time: 09/19/23  1301      History   Chief Complaint Chief Complaint  Patient presents with   Fall    I had a fall on Sunday directly on my tailbone area. The pain has only continued to get worse - Entered by patient    HPI Rachel Vaughn is a 62 y.o. female.   Patient presents for evaluation of persistent sacral pain beginning 4 days ago after fall.  Was at a parade sitting on a elevated platform when she fell backwards landing directly onto the sacrum, immediately had urinary incontinence.  Now having difficulty bearing weight to the sacrum, symptoms improved with leaning forward exacerbated by bending.  Pain interfering with sleep.  Does have some bruising.  No further urinary or bowel incontinence, numbness or tingling.  Takes a daily narcotic for chronic back pain which has been helpful, additionally has taken Arnica.  Past Medical History:  Diagnosis Date   Anxiety    Asthma    Diverticulitis    GERD (gastroesophageal reflux disease)    Social anxiety disorder     Patient Active Problem List   Diagnosis Date Noted   Fatigue 05/08/2022   Osteoarthritis of right shoulder 01/30/2022   Ventral hernia 05/02/2020   Advance directive discussed with patient 08/25/2018   Benzodiazepine dependence (HCC) 05/22/2018   IBS (irritable bowel syndrome) 02/13/2018   Narcotic dependence (HCC) 05/16/2017   MDD (major depressive disorder), recurrent, in partial remission (HCC) 03/20/2017   Routine general medical examination at a health care facility 03/05/2012   Chronic low back pain 03/05/2012   Genital herpes 01/15/2007   GAD (generalized anxiety disorder) 01/15/2007   Cough variant asthma 01/15/2007   GERD 01/15/2007   Diverticulosis of colon 01/15/2007   Sleep disturbance 01/15/2007    Past Surgical History:  Procedure Laterality Date   BREAST ENHANCEMENT SURGERY  1987   CESAREAN SECTION      OB History   No  obstetric history on file.      Home Medications    Prior to Admission medications   Medication Sig Start Date End Date Taking? Authorizing Provider  busPIRone (BUSPAR) 10 MG tablet TAKE 1 TABLET BY MOUTH TWICE DAILY 08/01/22   Karie Schwalbe, MD  cetirizine (ZYRTEC) 10 MG tablet Take 1 tablet (10 mg total) by mouth daily. 06/12/23   Karie Schwalbe, MD  citalopram (CELEXA) 20 MG tablet Take 1 tablet (20 mg total) by mouth daily. 08/22/23   Karie Schwalbe, MD  diclofenac sodium (VOLTAREN) 1 % GEL Apply 2 g topically 4 (four) times daily as needed. To hands 09/03/16   Karie Schwalbe, MD  fluticasone [facility]) 50 MCG/ACT nasal spray Place 2 sprays into both nostrils daily.    [provider]  HYDROcodone-acetaminophen (NORCO) 7.5-325 MG tablet Take 1-2 tablets by mouth 2 (two) times daily as needed. 09/12/23   Tillman Abide I, MD  hydrocortisone 2.5 % cream Apply topically 3 (three) times daily as needed. 10/06/15   Karie Schwalbe, MD  omeprazole (PRILOSEC) 20 MG capsule Take 1 capsule (20 mg total) by mouth daily. 01/28/23   Karie Schwalbe, MD  temazepam (RESTORIL) 15 MG capsule Take 1-2 capsules (15-30 mg total) by mouth at bedtime as needed for sleep. 09/12/23   Karie Schwalbe, MD  tiZANidine (ZANAFLEX) 2 MG tablet Take 1 tablet (2 mg total) by mouth every 6 (six) hours as needed  for muscle spasms. 07/11/22   Karie Schwalbe, MD  tretinoin (RETIN-A) 0.01 % gel Apply topically at bedtime. 09/14/16   Karie Schwalbe, MD  triamcinolone cream (KENALOG) 0.1 % apply to affected area twice a day AS NEEDED 05/17/16   Karie Schwalbe, MD  valACYclovir (VALTREX) 1000 MG tablet Take 1 tablet (1,000 mg total) by mouth 2 (two) times daily as needed. 02/15/23   Karie Schwalbe, MD  VENTOLIN HFA 108 (90 Base) MCG/ACT inhaler INHALE 2 PUFFS BY MOUTH EVERY 6 HOURS IF NEEDED FOR WHEEZING OR SHORTNESS OF BREATH. USE WITH SPACER 09/04/17   Karie Schwalbe, MD    Family  History Family History  Problem Relation Age of Onset   Asthma Mother    COPD Mother    Diabetes Maternal Grandmother    Cancer Neg Hx     Social History Social History   Tobacco Use   Smoking status: Never    Passive exposure: Past   Smokeless tobacco: Never  Vaping Use   Vaping status: Never Used  Substance Use Topics   Alcohol use: Yes   Drug use: No     Allergies   Fluoxetine hcl, Paroxetine, Poison ivy extract, and Venlafaxine   Review of Systems Review of Systems   Physical Exam Triage Vital Signs ED Triage Vitals  Encounter Vitals Group     BP 09/19/23 1331 (!) 156/77     Systolic BP Percentile --      Diastolic BP Percentile --      Pulse Rate 09/19/23 1331 69     Resp 09/19/23 1331 16     Temp 09/19/23 1331 98 F (36.7 C)     Temp Source 09/19/23 1331 Oral     SpO2 09/19/23 1331 97 %     Weight --      Height --      Head Circumference --      Peak Flow --      Pain Score 09/19/23 1330 7     Pain Loc --      Pain Education --      Exclude from Growth Chart --    No data found.  Updated Vital Signs BP (!) 156/77 (BP Location: Right Arm)   Pulse 69   Temp 98 F (36.7 C) (Oral)   Resp 16   LMP 11/27/2016 (Approximate)   SpO2 97%   Visual Acuity Right Eye Distance:   Left Eye Distance:   Bilateral Distance:    Right Eye Near:   Left Eye Near:    Bilateral Near:     Physical Exam Constitutional:      Appearance: Normal appearance.  Eyes:     Extraocular Movements: Extraocular movements intact.  Pulmonary:     Effort: Pulmonary effort is normal.  Musculoskeletal:     Comments: Ecchymosis directly over the sacrum, tender to palpation, no swelling or deformity, ecchymosis noted to the left buttocks, tender to palpation no swelling or deformity, able to bear weight but pain elicited  Neurological:     Mental Status: She is alert and oriented to person, place, and time.      UC Treatments / Results  Labs (all labs ordered are  listed, but only abnormal results are displayed) Labs Reviewed - No data to display  EKG   Radiology No results found.  Procedures Procedures (including critical care time)  Medications Ordered in UC Medications - No data to display  Initial Impression / Assessment and  Plan / UC Course  I have reviewed the triage vital signs and the nursing notes.  Pertinent labs & imaging results that were available during my care of the patient were reviewed by me and considered in my medical decision making (see chart for details).  Pain in the sacrum  X-ray pending, discussed pain possibly due to contusion, Toradol IM given, prescribed prednisone taper recommended heat or ice and pillows for support advised to follow-up with PCP or orthopedics if symptoms do not improve Final Clinical Impressions(s) / UC Diagnoses   Final diagnoses:  None   Discharge Instructions   None    ED Prescriptions   None    PDMP not reviewed this encounter.   Valinda Hoar, NP 09/19/23 1432

## 2023-09-19 NOTE — ED Triage Notes (Signed)
Pt was standing on an elevated platform and fell off 4 days ago and landed on her buttock.

## 2023-09-19 NOTE — Discharge Instructions (Signed)
Today you are evaluated for your tailbone pain  X-rays pending, you will be notified of results via telephone  There is bruising in the areas where you are experiencing pain that is possible that if no fracture present that you most likely have irritated the tissues which should improve with time  You have been given an injection of Toradol prior to discharge which helps with inflammation and pain and ideally will see improvement within the hour  Starting tomorrow begin prednisone every morning with food as directed to continue the above process, may take daily medications, Tylenol or any topical medicines for additional comfort  May use pillows for comfort and support ideally use donut pillow or place the pillow underneath the thighs so that the tailbone is floating which will provide you with the most relief  You may use heat or ice over the affected area in 10 to 15-minute intervals  If your pain continues to persist you may follow-up with her primary doctor or orthopedic specialist

## 2023-09-19 NOTE — Telephone Encounter (Signed)
Ported x-ray results via telephone, 2 patient identifiers used, discussed the patient will need to follow-up with orthopedics in 1 to 2 weeks and advised to attempt to keep weight and pressure off the sacrum until told to do so otherwise, discussed several methods, may follow-up with urgent care if needed

## 2023-10-12 ENCOUNTER — Other Ambulatory Visit: Payer: Self-pay | Admitting: Internal Medicine

## 2023-10-14 ENCOUNTER — Ambulatory Visit: Payer: Medicare HMO | Admitting: Orthopedic Surgery

## 2023-10-14 DIAGNOSIS — S322XXA Fracture of coccyx, initial encounter for closed fracture: Secondary | ICD-10-CM | POA: Diagnosis not present

## 2023-10-14 MED ORDER — HYDROCODONE-ACETAMINOPHEN 7.5-325 MG PO TABS: 1.0000 | ORAL_TABLET | Freq: Two times a day (BID) | ORAL | 0 refills | Status: DC | PRN

## 2023-10-14 MED ORDER — TEMAZEPAM 15 MG PO CAPS: 15.0000 mg | ORAL_CAPSULE | Freq: Every evening | ORAL | 0 refills | Status: DC | PRN

## 2023-10-14 NOTE — Telephone Encounter (Signed)
 Refill request for   temazepam (RESTORIL) 15 MG capsule and   HYDROcodone-acetaminophen (NORCO) 7.5-325 MG tablet      LOV - 07/30/23 Next OV - 10/31/23 Last refill - Temazepm- 09/12/23 #60/0                   Hydrocodone - 09/12/23 #120/0

## 2023-10-31 ENCOUNTER — Ambulatory Visit: Payer: Medicare HMO | Admitting: Internal Medicine

## 2023-10-31 ENCOUNTER — Encounter: Payer: Self-pay | Admitting: Internal Medicine

## 2023-10-31 VITALS — BP 122/80 | HR 84 | Temp 98.2°F | Ht 65.25 in | Wt 146.0 lb

## 2023-10-31 DIAGNOSIS — G8929 Other chronic pain: Secondary | ICD-10-CM

## 2023-10-31 DIAGNOSIS — M545 Low back pain, unspecified: Secondary | ICD-10-CM

## 2023-10-31 DIAGNOSIS — F1120 Opioid dependence, uncomplicated: Secondary | ICD-10-CM | POA: Diagnosis not present

## 2023-10-31 DIAGNOSIS — K219 Gastro-esophageal reflux disease without esophagitis: Secondary | ICD-10-CM | POA: Diagnosis not present

## 2023-10-31 MED ORDER — OMEPRAZOLE 20 MG PO CPDR: 20.0000 mg | DELAYED_RELEASE_CAPSULE | Freq: Two times a day (BID) | ORAL | 3 refills | Status: AC

## 2023-10-31 NOTE — Progress Notes (Signed)
Subjective:    Patient ID: Rachel Vaughn, female    DOB: 12/02/1960, 63 y.o.   MRN: [id number]  HPI Here for follow up of chronic pain and narcotic dependence  Did fall and fractured coccyx Very painful for a while--but much better now Given meloxicam daily-----but feels ready to stop  Acid reflux is worse now Still on the omeprazole--does take it twice a day sometimes Discussed that she should stop the meloxicam  Is going on a trip with friend (former landlord) Going to [place] and also [employer] about this (going with a group)---but great that she is willing to do this  Back pain is about the same Still gets relief with the hydrocodone Some trouble with the cold---and with the fall/coccyx fracture  Current Outpatient Medications on File Prior to Visit  Medication Sig Dispense Refill   busPIRone (BUSPAR) 10 MG tablet TAKE 1 TABLET BY MOUTH TWICE DAILY 180 tablet 3   cetirizine (ZYRTEC) 10 MG tablet Take 1 tablet (10 mg total) by mouth daily. 30 tablet 2   citalopram (CELEXA) 20 MG tablet Take 1 tablet (20 mg total) by mouth daily. 90 tablet 3   diclofenac sodium (VOLTAREN) 1 % GEL Apply 2 g topically 4 (four) times daily as needed. To hands 200 g 11   fluticasone (FLONASE) 50 MCG/ACT nasal spray Place 2 sprays into both nostrils daily.     HYDROcodone-acetaminophen (NORCO) 7.5-325 MG tablet Take 1-2 tablets by mouth 2 (two) times daily as needed. 120 tablet 0   hydrocortisone 2.5 % cream Apply topically 3 (three) times daily as needed. 28 g 3   meloxicam (MOBIC) 15 MG tablet Take 15 mg by mouth daily.     omeprazole (PRILOSEC) 20 MG capsule Take 1 capsule (20 mg total) by mouth daily. 90 capsule 3   temazepam (RESTORIL) 15 MG capsule Take 1-2 capsules (15-30 mg total) by mouth at bedtime as needed for sleep. 60 capsule 0   tiZANidine (ZANAFLEX) 2 MG tablet Take 1 tablet (2 mg total) by mouth every 6 (six) hours as needed for muscle spasms. 30 tablet 1   tretinoin  (RETIN-A) 0.01 % gel Apply topically at bedtime. 45 g 0   triamcinolone cream (KENALOG) 0.1 % apply to affected area twice a day AS NEEDED 30 g 2   valACYclovir (VALTREX) 1000 MG tablet Take 1 tablet (1,000 mg total) by mouth 2 (two) times daily as needed. 30 tablet 1   VENTOLIN HFA 108 (90 Base) MCG/ACT inhaler INHALE 2 PUFFS BY MOUTH EVERY 6 HOURS IF NEEDED FOR WHEEZING OR SHORTNESS OF BREATH. USE WITH SPACER 18 g 2   No current facility-administered medications on file prior to visit.    Allergies  Allergen Reactions   Fluoxetine Hcl     REACTION: unspecified   Paroxetine     REACTION: unspecified   Poison Ivy Extract    Venlafaxine     REACTION: unspecified    Past Medical History:  Diagnosis Date   Anxiety    Asthma    Diverticulitis    GERD (gastroesophageal reflux disease)    Social anxiety disorder     Past Surgical History:  Procedure Laterality Date   BREAST ENHANCEMENT SURGERY  1987   CESAREAN SECTION      Family History  Problem Relation Age of Onset   Asthma Mother    COPD Mother    Diabetes Maternal Grandmother    Cancer Neg Hx     Social History  Socioeconomic History   Marital status: Divorced    Spouse name: Not on file   Number of children: 2   Years of education: Not on file   Highest education level: Not on file  Occupational History   Occupation: Disabled ---anxiety  Tobacco Use   Smoking status: Never    Passive exposure: Past   Smokeless tobacco: Never  Vaping Use   Vaping status: Never Used  Substance and Sexual Activity   Alcohol use: Yes   Drug use: No   Sexual activity: Not on file  Other Topics Concern   Not on file  Social History Narrative   No living will   Would want children to make decisions   Would accept resuscitation   Not sure about tube feeds    Social Drivers of Health   Financial Resource Strain: Not on file  Food Insecurity: Not on file  Transportation Needs: Not on file  Physical Activity: Not on  file  Stress: Not on file  Social Connections: Not on file  Intimate Partner Violence: Not At Risk (05/24/2022)   [facility]    Threatened: Not on file    Insulted: Not on file    Physically Hurt : Not on file    Scream: Not on file   Review of Systems Sleeps okay---using the temazepam Appetite is fine     Objective:   Physical Exam Constitutional:      Appearance: Normal appearance.  Neurological:     Mental Status: She is alert.  Psychiatric:        Mood and Affect: Mood normal.        Behavior: Behavior normal.            Assessment & Plan:

## 2023-10-31 NOTE — Assessment & Plan Note (Signed)
Increased symptoms since on meloxicam---will stop Rx for the omeprazole to take bid if necessary

## 2023-10-31 NOTE — Assessment & Plan Note (Signed)
Doing okay with the hydrocodone Maintaining her independence doing all instrumental ADLs

## 2023-10-31 NOTE — Assessment & Plan Note (Signed)
PDMP reviewed No concerns 

## 2023-11-11 ENCOUNTER — Other Ambulatory Visit: Payer: Self-pay | Admitting: Internal Medicine

## 2023-11-11 MED ORDER — TEMAZEPAM 15 MG PO CAPS: 15.0000 mg | ORAL_CAPSULE | Freq: Every evening | ORAL | 0 refills | Status: DC | PRN

## 2023-11-11 MED ORDER — HYDROCODONE-ACETAMINOPHEN 7.5-325 MG PO TABS: 1.0000 | ORAL_TABLET | Freq: Two times a day (BID) | ORAL | 0 refills | Status: DC | PRN

## 2023-11-12 ENCOUNTER — Other Ambulatory Visit: Payer: Self-pay | Admitting: Internal Medicine

## 2023-11-27 ENCOUNTER — Encounter: Payer: Self-pay | Admitting: Internal Medicine

## 2023-12-09 ENCOUNTER — Other Ambulatory Visit: Payer: Self-pay | Admitting: Internal Medicine

## 2023-12-09 MED ORDER — TEMAZEPAM 15 MG PO CAPS
15.0000 mg | ORAL_CAPSULE | Freq: Every evening | ORAL | 0 refills | Status: DC | PRN
Start: 2023-12-09 — End: 2024-01-08

## 2023-12-09 MED ORDER — HYDROCODONE-ACETAMINOPHEN 7.5-325 MG PO TABS
1.0000 | ORAL_TABLET | Freq: Two times a day (BID) | ORAL | 0 refills | Status: DC | PRN
Start: 2023-12-09 — End: 2024-01-08

## 2023-12-09 NOTE — Telephone Encounter (Signed)
 Name of Medication: Hydrocodone Name of Pharmacy:  [facility] Last Written Date and Quantity: 11-11-23 #120  Last Office Visit and Type: 10-31-23 Next Office Visit and Type: 01-30-24 Last Controlled Substance Agreement Date: 04-29-23 Last UDS: 04-29-23   Temazepam last filled 11-11-23 #60

## 2023-12-26 ENCOUNTER — Ambulatory Visit

## 2023-12-26 VITALS — Ht 65.25 in | Wt 142.0 lb

## 2023-12-26 DIAGNOSIS — Z0000 Encounter for general adult medical examination without abnormal findings: Secondary | ICD-10-CM | POA: Diagnosis not present

## 2023-12-26 NOTE — Progress Notes (Signed)
 Subjective:   Rachel Vaughn is a 63 y.o. who presents for a Medicare Wellness preventive visit.  Visit Complete: Virtual I connected with  Rachel Vaughn on 12/26/23 by a audio enabled telemedicine application and verified that I am speaking with the correct person using two identifiers.  Patient Location: Home  Provider Location: Home Office  I discussed the limitations of evaluation and management by telemedicine. The patient expressed understanding and agreed to proceed.  Vital Signs: Because this visit was a virtual/telehealth visit, some criteria may be missing or patient reported. Any vitals not documented were not able to be obtained and vitals that have been documented are patient reported.  VideoError- Librarian, academic were attempted between this provider and patient, however failed, due to patient having technical difficulties OR patient did not have access to video capability.  We continued and completed visit with audio only.   Persons Participating in Visit: Patient.  AWV Questionnaire: No: Patient Medicare AWV questionnaire was not completed prior to this visit.  Cardiac Risk Factors include: sedentary lifestyle     Objective:    Today's Vitals   12/26/23 0943 12/26/23 0944  Weight: 142 lb (64.4 kg)   Height: 5' 5.25" (1.657 m)   PainSc:  5    Body mass index is 23.45 kg/m.     12/26/2023   10:05 AM  Advanced Directives  Does Patient Have a Medical Advance Directive? No    Current Medications (verified) Outpatient Encounter Medications as of 12/26/2023  Medication Sig   busPIRone (BUSPAR) 10 MG tablet TAKE 1 TABLET BY MOUTH TWICE DAILY   cetirizine (ZYRTEC) 10 MG tablet TAKE 1 TABLET BY MOUTH DAILY   citalopram (CELEXA) 20 MG tablet Take 1 tablet (20 mg total) by mouth daily.   diclofenac sodium (VOLTAREN) 1 % GEL Apply 2 g topically 4 (four) times daily as needed. To hands   fluticasone (FLONASE) 50 MCG/ACT nasal spray  Place 2 sprays into both nostrils daily.   HYDROcodone-acetaminophen (NORCO) 7.5-325 MG tablet Take 1-2 tablets by mouth 2 (two) times daily as needed.   meloxicam (MOBIC) 15 MG tablet Take 15 mg by mouth daily.   omeprazole (PRILOSEC) 20 MG capsule Take 1 capsule (20 mg total) by mouth 2 (two) times daily before a meal.   temazepam (RESTORIL) 15 MG capsule Take 1-2 capsules (15-30 mg total) by mouth at bedtime as needed for sleep.   valACYclovir (VALTREX) 1000 MG tablet Take 1 tablet (1,000 mg total) by mouth 2 (two) times daily as needed.   VENTOLIN HFA 108 (90 Base) MCG/ACT inhaler INHALE 2 PUFFS BY MOUTH EVERY 6 HOURS IF NEEDED FOR WHEEZING OR SHORTNESS OF BREATH. USE WITH SPACER   hydrocortisone 2.5 % cream Apply topically 3 (three) times daily as needed. (Patient not taking: Reported on 12/26/2023)   tiZANidine (ZANAFLEX) 2 MG tablet Take 1 tablet (2 mg total) by mouth every 6 (six) hours as needed for muscle spasms. (Patient not taking: Reported on 12/26/2023)   tretinoin (RETIN-A) 0.01 % gel Apply topically at bedtime. (Patient not taking: Reported on 12/26/2023)   triamcinolone cream (KENALOG) 0.1 % apply to affected area twice a day AS NEEDED (Patient not taking: Reported on 12/26/2023)   No facility-administered encounter medications on file as of 12/26/2023.    Allergies (verified) Fluoxetine hcl, Paroxetine, Poison ivy extract, and Venlafaxine   History: Past Medical History:  Diagnosis Date   Anxiety    Asthma    Diverticulitis  GERD (gastroesophageal reflux disease)    Social anxiety disorder    Past Surgical History:  Procedure Laterality Date   BREAST ENHANCEMENT SURGERY  1987   CESAREAN SECTION     Family History  Problem Relation Age of Onset   Asthma Mother    COPD Mother    Diabetes Maternal Grandmother    Cancer Neg Hx    Social History   Socioeconomic History   Marital status: Divorced    Spouse name: Not on file   Number of children: 2   Years of  education: Not on file   Highest education level: Not on file  Occupational History   Occupation: Disabled ---anxiety  Tobacco Use   Smoking status: Never    Passive exposure: Past   Smokeless tobacco: Never  Vaping Use   Vaping status: Never Used  Substance and Sexual Activity   Alcohol use: Yes   Drug use: No   Sexual activity: Not on file  Other Topics Concern   Not on file  Social History Narrative   No living will   Would want children to make decisions   Would accept resuscitation   Not sure about tube feeds    Social Drivers of Health   Financial Resource Strain: Low Risk  (12/26/2023)   Overall Financial Resource Strain (CARDIA)    Difficulty of Paying Living Expenses: Not hard at all  Food Insecurity: No Food Insecurity (12/26/2023)   Hunger Vital Sign    Worried About Running Out of Food in the Last Year: Never true    Ran Out of Food in the Last Year: Never true  Transportation Needs: No Transportation Needs (12/26/2023)   PRAPARE - Administrator, Civil Service (Medical): No    Lack of Transportation (Non-Medical): No  Physical Activity: Inactive (12/26/2023)   Exercise Vital Sign    Days of Exercise per Week: 0 days    Minutes of Exercise per Session: 0 min  Stress: Stress Concern Present (12/26/2023)   [employer] of Occupational Health - Occupational Stress Questionnaire    Feeling of Stress : Rather much  Social Connections: Socially Isolated (12/26/2023)   Social Connection and Isolation Panel [NHANES]    Frequency of Communication with Friends and Family: Never    Frequency of Social Gatherings with Friends and Family: Never    Attends Religious Services: Never    Database administrator or Organizations: No    Attends Engineer, structural: Never    Marital Status: Divorced    Tobacco Counseling Counseling given: Not Answered    Clinical Intake:  Pre-visit preparation completed: Yes  Pain : 0-10 Pain Score: 5  Pain  Type: Chronic pain Pain Location: Back Pain Orientation: Lower Pain Descriptors / Indicators: Aching Pain Onset: More than a month ago Pain Frequency: Intermittent Pain Relieving Factors: hydrocodone,heating pad  Pain Relieving Factors: hydrocodone,heating pad  BMI - recorded: 23.45 Nutritional Status: BMI of 19-24  Normal Nutritional Risks: None Diabetes: No  No results found for: "HGBA1C"   How often do you need to have someone help you when you read instructions, pamphlets, or other written materials from your doctor or pharmacy?: 1 - Never  Interpreter Needed?: No  Comments: lives alone Information entered by :: B.Shanti Agresti,LPN   Activities of Daily Living .    12/26/2023   10:05 AM  In your present state of health, do you have any difficulty performing the following activities:  Hearing? 0  Vision? 0  Difficulty concentrating or making decisions? 0  Walking or climbing stairs? 0  Dressing or bathing? 0  Doing errands, shopping? 0  Preparing Food and eating ? N  Using the Toilet? N  In the past six months, have you accidently leaked urine? N  Do you have problems with loss of bowel control? N  Managing your Medications? N  Managing your Finances? N  Housekeeping or managing your Housekeeping? N    Patient Care Team: Karie Schwalbe, MD as PCP - General  Indicate any recent Medical Services you may have received from other than [facility] providers in the past year (date may be approximate).     Assessment:   This is a routine wellness examination for Geralda.  Hearing/Vision screen Hearing Screening - Comments:: Pt says her hearing is pretty good Vision Screening - Comments:: Pt says her vision is good w/contacts [employer]/[facility]   Goals Addressed             This Visit's Progress    Patient Stated       12/26/23-I would like to exercise and lose weight (20lbs)       Depression Screen     12/26/2023    9:56 AM 04/29/2023   11:37 AM 01/28/2023    12:20 PM 10/29/2022   11:29 AM 09/08/2019    8:47 AM 08/25/2018    3:53 PM 08/19/2017    2:19 PM  PHQ 2/9 Scores  PHQ - 2 Score 1 3 1 4    0  PHQ- 9 Score  7  11     Exception Documentation     Medical reason Medical reason     Fall Risk     12/26/2023    9:49 AM 01/28/2023   12:20 PM 10/29/2022   11:29 AM 09/08/2019    8:46 AM 08/25/2018    3:53 PM  Fall Risk   Falls in the past year? 0 0 0 1 0  Number falls in past yr: 0  0 1   Injury with Fall? 0  0 0   Risk for fall due to : No Fall Risks  No Fall Risks    Follow up Education provided;Falls prevention discussed  Falls evaluation completed Falls prevention discussed     MEDICARE RISK AT HOME:  Medicare Risk at Home Any stairs in or around the home?: No If so, are there any without handrails?: No Home free of loose throw rugs in walkways, pet beds, electrical cords, etc?: Yes Adequate lighting in your home to reduce risk of falls?: Yes Life alert?: No Use of a cane, walker or w/c?: No Grab bars in the bathroom?: No Shower chair or bench in shower?: No Elevated toilet seat or a handicapped toilet?: No  TIMED UP AND GO:  Was the test performed?  No  Cognitive Function: 6CIT completed        12/26/2023   10:08 AM  6CIT Screen  What Year? 0 points  What month? 0 points  What time? 0 points  Count back from 20 0 points  Months in reverse 0 points  Repeat phrase 2 points  Total Score 2 points    Immunizations Immunization History  Administered Date(s) Administered   Influenza Inj Mdck Quad Pf 08/12/2018, 07/24/2019   Influenza Split 07/06/2011   Influenza Whole 08/15/2007, 09/13/2009   Influenza, Seasonal, Injecte, Preservative Fre 09/08/2012   Influenza,inj,Quad PF,6+ Mos 06/30/2013, 06/25/2014, 07/21/2015, 08/13/2016, 07/25/2017   Influenza-Unspecified 08/30/2020, 06/08/2021, 10/22/2022, 08/01/2023   Janssen (J&J) SARS-COV-2  Vaccination 11/23/2019, 01/06/2020   MMR 05/22/2018   PFIZER(Purple Top)SARS-COV-2  Vaccination 08/30/2020   Td 05/08/1997   Tdap 03/05/2012   Unspecified SARS-COV-2 Vaccination 08/01/2023   Zoster Recombinant(Shingrix) 08/10/2020, 05/08/2021    Screening Tests Health Maintenance  Topic Date Due   Pneumococcal Vaccine 84-56 Years old (1 of 2 - PCV) Never done   DTaP/Tdap/Td (3 - Td or Tdap) 03/05/2022   Medicare Annual Wellness (AWV)  12/25/2024   MAMMOGRAM  02/26/2025   Colonoscopy  09/26/2026   Cervical Cancer Screening (HPV/Pap Cotest)  10/30/2026   INFLUENZA VACCINE  Completed   COVID-19 Vaccine  Completed   Hepatitis C Screening  Completed   HIV Screening  Completed   Zoster Vaccines- Shingrix  Completed   HPV VACCINES  Aged Out    Health Maintenance  Health Maintenance Due  Topic Date Due   Pneumococcal Vaccine 77-94 Years old (1 of 2 - PCV) Never done   DTaP/Tdap/Td (3 - Td or Tdap) 03/05/2022   Health Maintenance Items Addressed: Pt declines MMG this year  Additional Screening:  Vision Screening: Recommended annual ophthalmology exams for early detection of glaucoma and other disorders of the eye.  Dental Screening: Recommended annual dental exams for proper oral hygiene  Community Resource Referral / Chronic Care Management: CRR required this visit?  No   CCM required this visit?  No     Plan:     I have personally reviewed and noted the following in the patient's chart:   Medical and social history Use of alcohol, tobacco or illicit drugs  Current medications and supplements including opioid prescriptions. Patient is currently taking opioid prescriptions. Information provided to patient regarding non-opioid alternatives. Patient advised to discuss non-opioid treatment plan with their provider. Functional ability and status Nutritional status Physical activity Advanced directives List of other physicians Hospitalizations, surgeries, and ER visits in previous 12 months Vitals Screenings to include cognitive, depression, and  falls Referrals and appointments  In addition, I have reviewed and discussed with patient certain preventive protocols, quality metrics, and best practice recommendations. A written personalized care plan for preventive services as well as general preventive health recommendations were provided to patient.     Sue Lush, LPN   1/88/4166   After Visit Summary: (MyChart) Due to this being a telephonic visit, the after visit summary with patients personalized plan was offered to patient via MyChart   Notes: Nothing significant to report at this time.

## 2023-12-26 NOTE — Patient Instructions (Signed)
 Rachel Vaughn , Thank you for taking time to come for your Medicare Wellness Visit. I appreciate your ongoing commitment to your health goals. Please review the following plan we discussed and let me know if I can assist you in the future.   Referrals/Orders/Follow-Ups/Clinician Recommendations: none  This is a list of the screening recommended for you and due dates:  Health Maintenance  Topic Date Due   Pneumococcal Vaccination (1 of 2 - PCV) Never done   DTaP/Tdap/Td vaccine (3 - Td or Tdap) 03/05/2022   Medicare Annual Wellness Visit  12/25/2024   Mammogram  02/26/2025   Colon Cancer Screening  09/26/2026   Pap with HPV screening  10/30/2026   Flu Shot  Completed   COVID-19 Vaccine  Completed   Hepatitis C Screening  Completed   HIV Screening  Completed   Zoster (Shingles) Vaccine  Completed   HPV Vaccine  Aged Out    Advanced directives: (ACP Link)Information on Advanced Care Planning can be found at [facility] [place] Advance Health Care Directives Advance Health Care Directives. [email or web address]   Next Medicare Annual Wellness Visit scheduled for next year: Yes 12/25/24 @ 9:30am televisit

## 2024-01-08 ENCOUNTER — Other Ambulatory Visit: Payer: Self-pay | Admitting: Internal Medicine

## 2024-01-08 MED ORDER — TEMAZEPAM 15 MG PO CAPS: 15.0000 mg | ORAL_CAPSULE | Freq: Every evening | ORAL | 0 refills | Status: DC | PRN

## 2024-01-08 MED ORDER — HYDROCODONE-ACETAMINOPHEN 7.5-325 MG PO TABS
1.0000 | ORAL_TABLET | Freq: Two times a day (BID) | ORAL | 0 refills | Status: DC | PRN
Start: 2024-01-08 — End: 2024-02-07

## 2024-01-08 NOTE — Telephone Encounter (Signed)
 Name of Medication: Hydrocodone Name of Pharmacy:  [facility] Last Written Date and Quantity: 12-09-23 #120  Last Office Visit and Type: 10-31-23 Next Office Visit and Type: 01-30-24 Last Controlled Substance Agreement Date: 04-29-23 Last UDS: 04-29-23   Temazepam last filled 12-09-23 #60

## 2024-01-30 ENCOUNTER — Ambulatory Visit (INDEPENDENT_AMBULATORY_CARE_PROVIDER_SITE_OTHER): Payer: Medicare HMO | Admitting: Internal Medicine

## 2024-01-30 ENCOUNTER — Encounter: Payer: Self-pay | Admitting: Internal Medicine

## 2024-01-30 VITALS — BP 102/70 | HR 67 | Temp 97.9°F | Ht 65.0 in | Wt 141.0 lb

## 2024-01-30 DIAGNOSIS — F3341 Major depressive disorder, recurrent, in partial remission: Secondary | ICD-10-CM

## 2024-01-30 DIAGNOSIS — M545 Low back pain, unspecified: Secondary | ICD-10-CM | POA: Diagnosis not present

## 2024-01-30 DIAGNOSIS — F411 Generalized anxiety disorder: Secondary | ICD-10-CM

## 2024-01-30 DIAGNOSIS — G8929 Other chronic pain: Secondary | ICD-10-CM | POA: Diagnosis not present

## 2024-01-30 DIAGNOSIS — R002 Palpitations: Secondary | ICD-10-CM

## 2024-01-30 DIAGNOSIS — Z0000 Encounter for general adult medical examination without abnormal findings: Secondary | ICD-10-CM | POA: Diagnosis not present

## 2024-01-30 NOTE — Progress Notes (Signed)
 Subjective:    Patient ID: Rachel Vaughn, female    DOB: 01-11-1961, 63 y.o.   MRN: [id number]  HPI Here for physical  Did get through the [place] trip Friend's daughter didn't like her----doesn't like white people One bad spell of anxiety--but mostly enjoyed it  [place] is much better No issues now  Back is sorer lately Trying to get back to regular walking  Anxiety persists Still hard going to the grocery store  Current Outpatient Medications on File Prior to Visit  Medication Sig Dispense Refill   busPIRone  (BUSPAR ) 10 MG tablet TAKE 1 TABLET BY MOUTH TWICE DAILY 180 tablet 3   cetirizine  (ZYRTEC ) 10 MG tablet TAKE 1 TABLET BY MOUTH DAILY 90 tablet 3   citalopram  (CELEXA ) 20 MG tablet Take 1 tablet (20 mg total) by mouth daily. 90 tablet 3   diclofenac  sodium (VOLTAREN ) 1 % GEL Apply 2 g topically 4 (four) times daily as needed. To hands 200 g 11   fluticasone (FLONASE) 50 MCG/ACT nasal spray Place 2 sprays into both nostrils daily.     HYDROcodone -acetaminophen  (NORCO) 7.5-325 MG tablet Take 1-2 tablets by mouth 2 (two) times daily as needed. 120 tablet 0   hydrocortisone  2.5 % cream Apply topically 3 (three) times daily as needed. 28 g 3   meloxicam (MOBIC) 15 MG tablet Take 15 mg by mouth daily.     omeprazole  (PRILOSEC) 20 MG capsule Take 1 capsule (20 mg total) by mouth 2 (two) times daily before a meal. 180 capsule 3   temazepam  (RESTORIL ) 15 MG capsule Take 1-2 capsules (15-30 mg total) by mouth at bedtime as needed for sleep. 60 capsule 0   tiZANidine  (ZANAFLEX ) 2 MG tablet Take 1 tablet (2 mg total) by mouth every 6 (six) hours as needed for muscle spasms. 30 tablet 1   tretinoin  (RETIN-A ) 0.01 % gel Apply topically at bedtime. 45 g 0   triamcinolone  cream (KENALOG ) 0.1 % apply to affected area twice a day AS NEEDED 30 g 2   valACYclovir  (VALTREX ) 1000 MG tablet Take 1 tablet (1,000 mg total) by mouth 2 (two) times daily as needed. 30 tablet 1   VENTOLIN  HFA 108  (90 Base) MCG/ACT inhaler INHALE 2 PUFFS BY MOUTH EVERY 6 HOURS IF NEEDED FOR WHEEZING OR SHORTNESS OF BREATH. USE WITH SPACER 18 g 2   No current facility-administered medications on file prior to visit.    Allergies  Allergen Reactions   Fluoxetine Hcl     REACTION: unspecified   Paroxetine     REACTION: unspecified   Poison Ivy Extract    Venlafaxine     REACTION: unspecified    Past Medical History:  Diagnosis Date   Anxiety    Asthma    Diverticulitis    GERD (gastroesophageal reflux disease)    Social anxiety disorder     Past Surgical History:  Procedure Laterality Date   BREAST ENHANCEMENT SURGERY  1987   CESAREAN SECTION      Family History  Problem Relation Age of Onset   Asthma Mother    COPD Mother    Diabetes Maternal Grandmother    Cancer Neg Hx     Social History   Socioeconomic History   Marital status: Divorced    Spouse name: Not on file   Number of children: 2   Years of education: Not on file   Highest education level: Not on file  Occupational History   Occupation: Disabled ---anxiety  Tobacco  Use   Smoking status: Never    Passive exposure: Past   Smokeless tobacco: Never  Vaping Use   Vaping status: Never Used  Substance and Sexual Activity   Alcohol use: Yes   Drug use: No   Sexual activity: Not on file  Other Topics Concern   Not on file  Social History Narrative   No living will   Would want children to make decisions   Would accept resuscitation   Not sure about tube feeds    Social Drivers of Health   Financial Resource Strain: Low Risk  (12/26/2023)   Overall Financial Resource Strain (CARDIA)    Difficulty of Paying Living Expenses: Not hard at all  Food Insecurity: No Food Insecurity (12/26/2023)   Hunger Vital Sign    Worried About Running Out of Food in the Last Year: Never true    Ran Out of Food in the Last Year: Never true  Transportation Needs: No Transportation Needs (12/26/2023)   PRAPARE - Therapist, art (Medical): No    Lack of Transportation (Non-Medical): No  Physical Activity: Inactive (12/26/2023)   Exercise Vital Sign    Days of Exercise per Week: 0 days    Minutes of Exercise per Session: 0 min  Stress: Stress Concern Present (12/26/2023)   [employer] of Occupational Health - Occupational Stress Questionnaire    Feeling of Stress : Rather much  Social Connections: Socially Isolated (12/26/2023)   Social Connection and Isolation Panel [NHANES]    Frequency of Communication with Friends and Family: Never    Frequency of Social Gatherings with Friends and Family: Never    Attends Religious Services: Never    Database administrator or Organizations: No    Attends Banker Meetings: Never    Marital Status: Divorced  Catering manager Violence: Not At Risk (12/26/2023)   Humiliation, Afraid, Rape, and Kick questionnaire    Fear of Current or Ex-Partner: No    Emotionally Abused: No    Physically Abused: No    Sexually Abused: No   Review of Systems  Constitutional:        Lost a few pounds---cut back on sweets Wears seat belt  HENT:  Negative for dental problem, hearing loss, tinnitus and trouble swallowing.        Keeps up with dentist  Eyes:  Negative for visual disturbance.       No diplopia or unlateral vision loss  Respiratory:  Negative for chest tightness and shortness of breath.        Some AM cough  Cardiovascular:  Negative for chest pain and leg swelling.       Rare palpitations  Gastrointestinal:  Negative for blood in stool and constipation.       Ongoing heartburn--mostly controlled with Rx  Endocrine: Negative for polydipsia and polyuria.  Genitourinary:  Negative for dysuria and hematuria.  Musculoskeletal:  Positive for back pain. Negative for joint swelling.  Skin:        Some itchy breakouts on upper chest--HC 2.5% helps  Allergic/Immunologic: Positive for environmental allergies. Negative for immunocompromised  state.       Cetirizine  is helpful  Neurological:  Negative for dizziness, syncope, light-headedness and headaches.  Hematological:  Negative for adenopathy. Bruises/bleeds easily.  Psychiatric/Behavioral:  Positive for dysphoric mood. Negative for sleep disturbance. The patient is nervous/anxious.        No increase in depression or anxiety  Objective:   Physical Exam Constitutional:      Appearance: Normal appearance.  HENT:     Mouth/Throat:     Pharynx: No oropharyngeal exudate or posterior oropharyngeal erythema.  Eyes:     Conjunctiva/sclera: Conjunctivae normal.     Pupils: Pupils are equal, round, and reactive to light.  Cardiovascular:     Rate and Rhythm: Normal rate and regular rhythm.     Pulses: Normal pulses.     Heart sounds: No murmur heard.    No gallop.  Pulmonary:     Effort: Pulmonary effort is normal.     Breath sounds: Normal breath sounds. No wheezing or rales.  Abdominal:     Palpations: Abdomen is soft.     Tenderness: There is no abdominal tenderness.  Musculoskeletal:     Cervical back: Neck supple.     Right lower leg: No edema.     Left lower leg: No edema.  Lymphadenopathy:     Cervical: No cervical adenopathy.  Skin:    Findings: No lesion or rash.  Neurological:     General: No focal deficit present.     Mental Status: She is alert and oriented to person, place, and time.  Psychiatric:        Mood and Affect: Mood normal.        Behavior: Behavior normal.            Assessment & Plan:

## 2024-01-30 NOTE — Progress Notes (Signed)
 Hearing Screening - Comments:: Passed whisper test Vision Screening - Comments:: October 2024

## 2024-01-30 NOTE — Assessment & Plan Note (Signed)
Does okay with the hydrocodone 

## 2024-01-30 NOTE — Assessment & Plan Note (Signed)
 Doing okay with citalopram  20 Functional still

## 2024-01-30 NOTE — Assessment & Plan Note (Signed)
 Still with marked disability from this--even has trouble going to the grocery store Buspirone  10 bid, citalopram 

## 2024-01-30 NOTE — Assessment & Plan Note (Signed)
 Healthy Working on going back to regular exercise Colon due 2027 Mammogram due by next May Last pap in 2-3 years Td at the pharmacy COVID/flu vaccines in the fall

## 2024-01-30 NOTE — Assessment & Plan Note (Signed)
 Likely from stress/anxiety Better lately

## 2024-01-31 ENCOUNTER — Encounter: Payer: Self-pay | Admitting: Internal Medicine

## 2024-01-31 LAB — COMPREHENSIVE METABOLIC PANEL WITH GFR
ALT: 10 U/L (ref 0–35)
AST: 14 U/L (ref 0–37)
Albumin: 4.2 g/dL (ref 3.5–5.2)
Alkaline Phosphatase: 63 U/L (ref 39–117)
BUN: 19 mg/dL (ref 6–23)
CO2: 30 meq/L (ref 19–32)
Calcium: 9.2 mg/dL (ref 8.4–10.5)
Chloride: 102 meq/L (ref 96–112)
Creatinine, Ser: 0.85 mg/dL (ref 0.40–1.20)
GFR: 72.6 mL/min (ref >60.00)
Glucose, Bld: 89 mg/dL (ref 70–99)
Potassium: 4.3 meq/L (ref 3.5–5.1)
Sodium: 139 meq/L (ref 135–145)
Total Bilirubin: 0.2 mg/dL (ref 0.2–1.2)
Total Protein: 6.7 g/dL (ref 6.0–8.3)

## 2024-01-31 LAB — LIPID PANEL
Cholesterol: 255 mg/dL — ABNORMAL HIGH (ref 0–200)
HDL: 73.5 mg/dL (ref >39.00)
LDL Cholesterol: 149 mg/dL — ABNORMAL HIGH (ref 0–99)
NonHDL: 181.37
Total CHOL/HDL Ratio: 3
Triglycerides: 163 mg/dL — ABNORMAL HIGH (ref 0.0–149.0)
VLDL: 32.6 mg/dL (ref 0.0–40.0)

## 2024-01-31 LAB — CBC
HCT: 40 % (ref 36.0–46.0)
Hemoglobin: 13.2 g/dL (ref 12.0–15.0)
MCHC: 32.9 g/dL (ref 30.0–36.0)
MCV: 97.1 fl (ref 78.0–100.0)
Platelets: 263 10*3/uL (ref 150.0–400.0)
RBC: 4.12 Mil/uL (ref 3.87–5.11)
RDW: 13.1 % (ref 11.5–15.5)
WBC: 6.1 10*3/uL (ref 4.0–10.5)

## 2024-01-31 LAB — TSH: TSH: 0.82 u[IU]/mL (ref 0.35–5.50)

## 2024-02-07 ENCOUNTER — Other Ambulatory Visit: Payer: Self-pay | Admitting: Internal Medicine

## 2024-02-07 MED ORDER — TEMAZEPAM 15 MG PO CAPS
15.0000 mg | ORAL_CAPSULE | Freq: Every evening | ORAL | 0 refills | Status: DC | PRN
Start: 2024-02-07 — End: 2024-03-09

## 2024-02-07 MED ORDER — HYDROCODONE-ACETAMINOPHEN 7.5-325 MG PO TABS
1.0000 | ORAL_TABLET | Freq: Two times a day (BID) | ORAL | 0 refills | Status: DC | PRN
Start: 2024-02-07 — End: 2024-03-09

## 2024-02-07 NOTE — Telephone Encounter (Signed)
 Name of Medication: Hydrocodone  Name of Pharmacy:  [facility] Last Written Date and Quantity: 01-08-24 #120  Last Office Visit and Type: 01-30-24 Next Office Visit and Type: 04-30-24 Last Controlled Substance Agreement Date: 04-29-23 Last UDS: 04-29-23   Temazepam  last filled 12-09-23 #60

## 2024-02-24 ENCOUNTER — Encounter: Payer: Self-pay | Admitting: Internal Medicine

## 2024-03-07 ENCOUNTER — Encounter: Payer: Self-pay | Admitting: Internal Medicine

## 2024-03-09 ENCOUNTER — Other Ambulatory Visit: Payer: Self-pay | Admitting: Internal Medicine

## 2024-03-09 MED ORDER — HYDROCODONE-ACETAMINOPHEN 7.5-325 MG PO TABS: 1.0000 | ORAL_TABLET | Freq: Two times a day (BID) | ORAL | 0 refills | Status: DC | PRN

## 2024-03-09 NOTE — Telephone Encounter (Signed)
 Name of Medication: Hydrocodone  Name of Pharmacy:  [facility] Last Written Date and Quantity: 02-07-24 #120  Last Office Visit and Type: 01-30-24 Next Office Visit and Type: 04-30-24 Last Controlled Substance Agreement Date: 04-29-23 Last UDS: 04-29-23   Temazepam  last filled 02-07-24 #60

## 2024-03-09 NOTE — Telephone Encounter (Signed)
 Copied from CRM [id number]. Topic: Clinical - Medication Refill >> Mar 09, 2024  2:54 PM Juleen Oakland F wrote: Medication: temazepam  (RESTORIL ) 15 MG capsule  Has the patient contacted their pharmacy? Yes (Agent: If no, request that the patient contact the pharmacy for the refill. If patient does not wish to contact the pharmacy document the reason why and proceed with request.) (Agent: If yes, when and what did the pharmacy advise?)  This is the patient's preferred pharmacy:  [facility] [id number] - [address], [place] - [phone] [place] [address] Phone: [phone] Fax: [phone]  Is this the correct pharmacy for this prescription? Yes If no, delete pharmacy and type the correct one.   Has the prescription been filled recently? Yes  Is the patient out of the medication? Yes  Has the patient been seen for an appointment in the last year OR does the patient have an upcoming appointment? Yes  Can we respond through MyChart? No  Agent: Please be advised that Rx refills may take up to 3 business days. We ask that you follow-up with your pharmacy.

## 2024-04-06 ENCOUNTER — Encounter: Payer: Self-pay | Admitting: Internal Medicine

## 2024-04-06 MED ORDER — TEMAZEPAM 15 MG PO CAPS: ORAL_CAPSULE | ORAL | 0 refills | Status: DC

## 2024-04-06 MED ORDER — HYDROCODONE-ACETAMINOPHEN 7.5-325 MG PO TABS: 1.0000 | ORAL_TABLET | Freq: Two times a day (BID) | ORAL | 0 refills | Status: DC | PRN

## 2024-04-06 NOTE — Telephone Encounter (Signed)
 Name of Medication: Hydrocodone  Name of Pharmacy:  [facility] Last Written Date and Quantity: 03-09-24 #120  Last Office Visit and Type: 01-30-24 Next Office Visit and Type: 04-30-24 Last Controlled Substance Agreement Date: 04-29-23 Last UDS: 04-29-23   Temazepam  last filled 03-09-24 #60

## 2024-04-29 ENCOUNTER — Other Ambulatory Visit: Payer: Self-pay | Admitting: Medical Genetics

## 2024-04-30 ENCOUNTER — Encounter: Payer: Self-pay | Admitting: Internal Medicine

## 2024-04-30 ENCOUNTER — Ambulatory Visit (INDEPENDENT_AMBULATORY_CARE_PROVIDER_SITE_OTHER): Admitting: Internal Medicine

## 2024-04-30 VITALS — BP 110/74 | HR 70 | Temp 98.3°F | Ht 65.0 in | Wt 139.0 lb

## 2024-04-30 DIAGNOSIS — F411 Generalized anxiety disorder: Secondary | ICD-10-CM | POA: Diagnosis not present

## 2024-04-30 DIAGNOSIS — F1120 Opioid dependence, uncomplicated: Secondary | ICD-10-CM

## 2024-04-30 DIAGNOSIS — M545 Low back pain, unspecified: Secondary | ICD-10-CM | POA: Diagnosis not present

## 2024-04-30 DIAGNOSIS — G8929 Other chronic pain: Secondary | ICD-10-CM | POA: Diagnosis not present

## 2024-04-30 NOTE — Progress Notes (Signed)
 Subjective:    Patient ID: Rachel Vaughn, female    DOB: 07/22/1961, 63 y.o.   MRN: [id number]  HPI Here for follow up of chronic pain and narcotic dependence Also chronic anxiety  Doing okay Pain issues are stable Anxiety is stable and relatively controlled Does go shopping once a month---orders on line for sundries Does plan to see Marsha Moose at outdoor concert too---if the temperature is not too bad No depression  Current Outpatient Medications on File Prior to Visit  Medication Sig Dispense Refill   busPIRone  (BUSPAR ) 10 MG tablet TAKE 1 TABLET BY MOUTH TWICE DAILY 180 tablet 3   cetirizine  (ZYRTEC ) 10 MG tablet TAKE 1 TABLET BY MOUTH DAILY 90 tablet 3   citalopram  (CELEXA ) 20 MG tablet Take 1 tablet (20 mg total) by mouth daily. 90 tablet 3   diclofenac  sodium (VOLTAREN ) 1 % GEL Apply 2 g topically 4 (four) times daily as needed. To hands 200 g 11   HYDROcodone -acetaminophen  (NORCO) 7.5-325 MG tablet Take 1-2 tablets by mouth 2 (two) times daily as needed. 120 tablet 0   hydrocortisone  2.5 % cream Apply topically 3 (three) times daily as needed. 28 g 3   omeprazole  (PRILOSEC) 20 MG capsule Take 1 capsule (20 mg total) by mouth 2 (two) times daily before a meal. 180 capsule 3   temazepam  (RESTORIL ) 15 MG capsule TAKE 1-2 CAPSULES BY MOUTH AT BEDTIME AS NEEDED FOR SLEEP 60 capsule 0   tiZANidine  (ZANAFLEX ) 2 MG tablet Take 1 tablet (2 mg total) by mouth every 6 (six) hours as needed for muscle spasms. 30 tablet 1   triamcinolone  cream (KENALOG ) 0.1 % apply to affected area twice a day AS NEEDED 30 g 2   valACYclovir  (VALTREX ) 1000 MG tablet Take 1 tablet (1,000 mg total) by mouth 2 (two) times daily as needed. 30 tablet 1   VENTOLIN  HFA 108 (90 Base) MCG/ACT inhaler INHALE 2 PUFFS BY MOUTH EVERY 6 HOURS IF NEEDED FOR WHEEZING OR SHORTNESS OF BREATH. USE WITH SPACER 18 g 2   No current facility-administered medications on file prior to visit.    Allergies  Allergen  Reactions   Fluoxetine Hcl     REACTION: unspecified   Paroxetine     REACTION: unspecified   Poison Ivy Extract    Venlafaxine     REACTION: unspecified    Past Medical History:  Diagnosis Date   Anxiety    Asthma    Diverticulitis    GERD (gastroesophageal reflux disease)    Social anxiety disorder     Past Surgical History:  Procedure Laterality Date   BREAST ENHANCEMENT SURGERY  1987   CESAREAN SECTION      Family History  Problem Relation Age of Onset   Asthma Mother    COPD Mother    Diabetes Maternal Grandmother    Cancer Neg Hx     Social History   Socioeconomic History   Marital status: Divorced    Spouse name: Not on file   Number of children: 2   Years of education: Not on file   Highest education level: Not on file  Occupational History   Occupation: Disabled ---anxiety  Tobacco Use   Smoking status: Never    Passive exposure: Past   Smokeless tobacco: Never  Vaping Use   Vaping status: Never Used  Substance and Sexual Activity   Alcohol use: Yes   Drug use: No   Sexual activity: Not on file  Other Topics  Concern   Not on file  Social History Narrative   No living will   Would want children to make decisions   Would accept resuscitation   Not sure about tube feeds    Social Drivers of Health   Financial Resource Strain: Low Risk  (12/26/2023)   Overall Financial Resource Strain (CARDIA)    Difficulty of Paying Living Expenses: Not hard at all  Food Insecurity: No Food Insecurity (12/26/2023)   Hunger Vital Sign    Worried About Running Out of Food in the Last Year: Never true    Ran Out of Food in the Last Year: Never true  Transportation Needs: No Transportation Needs (12/26/2023)   PRAPARE - Administrator, Civil Service (Medical): No    Lack of Transportation (Non-Medical): No  Physical Activity: Inactive (12/26/2023)   Exercise Vital Sign    Days of Exercise per Week: 0 days    Minutes of Exercise per Session: 0 min   Stress: Stress Concern Present (12/26/2023)   [employer] of Occupational Health - Occupational Stress Questionnaire    Feeling of Stress : Rather much  Social Connections: Socially Isolated (12/26/2023)   Social Connection and Isolation Panel    Frequency of Communication with Friends and Family: Never    Frequency of Social Gatherings with Friends and Family: Never    Attends Religious Services: Never    Database administrator or Organizations: No    Attends Banker Meetings: Never    Marital Status: Divorced  Catering manager Violence: Not At Risk (12/26/2023)   Humiliation, Afraid, Rape, and Kick questionnaire    Fear of Current or Ex-Partner: No    Emotionally Abused: No    Physically Abused: No    Sexually Abused: No   Review of Systems Some trouble with yellow eye drainage in the morning Allergy drops don't help No pain or redness Forgets to take out contacts Discussed possible tear duct blockage    Objective:   Physical Exam Constitutional:      Appearance: Normal appearance.  Neurological:     Mental Status: She is alert.  Psychiatric:        Mood and Affect: Mood normal.        Behavior: Behavior normal.            Assessment & Plan:

## 2024-04-30 NOTE — Assessment & Plan Note (Signed)
 Pain control is about the same Uses the hydrocodone  regularly

## 2024-04-30 NOTE — Assessment & Plan Note (Signed)
 PDMP reviewed No concerns

## 2024-04-30 NOTE — Assessment & Plan Note (Signed)
 Ongoing functional limitations but she gets by with limited trips out of her house Is able to go to social events occasionally

## 2024-05-02 LAB — DRUG MONITORING, PANEL 8 WITH CONFIRMATION, URINE
6 Acetylmorphine: NEGATIVE ng/mL (ref <10)
Alcohol Metabolites: POSITIVE ng/mL — AB (ref <500)
Alphahydroxyalprazolam: NEGATIVE ng/mL (ref <25)
Alphahydroxymidazolam: NEGATIVE ng/mL (ref <50)
Alphahydroxytriazolam: NEGATIVE ng/mL (ref <50)
Aminoclonazepam: NEGATIVE ng/mL (ref <25)
Amphetamines: NEGATIVE ng/mL (ref <500)
Benzodiazepines: POSITIVE ng/mL — AB (ref <100)
Buprenorphine, Urine: NEGATIVE ng/mL (ref <5)
Cocaine Metabolite: NEGATIVE ng/mL (ref <150)
Codeine: NEGATIVE ng/mL (ref <50)
Creatinine: 154.4 mg/dL (ref >=20.0)
Ethyl Glucuronide (ETG): 84182 ng/mL — ABNORMAL HIGH (ref <500)
Ethyl Sulfate (ETS): 24578 ng/mL — ABNORMAL HIGH (ref <100)
Hydrocodone: 5149 ng/mL — ABNORMAL HIGH (ref <50)
Hydromorphone: 209 ng/mL — ABNORMAL HIGH (ref <50)
Hydroxyethylflurazepam: NEGATIVE ng/mL (ref <50)
Lorazepam: NEGATIVE ng/mL (ref <50)
MDMA: NEGATIVE ng/mL (ref <500)
Marijuana Metabolite: NEGATIVE ng/mL (ref <20)
Morphine: NEGATIVE ng/mL (ref <50)
Nordiazepam: NEGATIVE ng/mL (ref <50)
Norhydrocodone: 7817 ng/mL — ABNORMAL HIGH (ref <50)
Opiates: POSITIVE ng/mL — AB (ref <100)
Oxazepam: 2416 ng/mL — ABNORMAL HIGH (ref <50)
Oxidant: NEGATIVE ug/mL (ref <200)
Oxycodone: NEGATIVE ng/mL (ref <100)
Temazepam: >8000 ng/mL — ABNORMAL HIGH (ref <50)
pH: 5.5 (ref 4.5–9.0)

## 2024-05-02 LAB — DM TEMPLATE

## 2024-05-04 ENCOUNTER — Ambulatory Visit: Payer: Self-pay | Admitting: Internal Medicine

## 2024-05-07 ENCOUNTER — Encounter: Payer: Self-pay | Admitting: Internal Medicine

## 2024-05-07 MED ORDER — TEMAZEPAM 15 MG PO CAPS: ORAL_CAPSULE | ORAL | 0 refills | Status: DC

## 2024-05-07 MED ORDER — HYDROCODONE-ACETAMINOPHEN 7.5-325 MG PO TABS: 1.0000 | ORAL_TABLET | Freq: Two times a day (BID) | ORAL | 0 refills | Status: DC | PRN

## 2024-05-07 NOTE — Telephone Encounter (Signed)
 Name of Medication: Hydrocodone  Name of Pharmacy:  [facility] Last Written Date and Quantity: 04-06-24 #120  Last Office Visit and Type: 04-30-24 Next Office Visit and Type: 07-31-24 Last Controlled Substance Agreement Date: 04-30-24 Last UDS: 04-30-24   Temazepam  last filled 04-06-24 #60

## 2024-05-26 DIAGNOSIS — R2242 Localized swelling, mass and lump, left lower limb: Secondary | ICD-10-CM | POA: Diagnosis not present

## 2024-06-08 ENCOUNTER — Encounter: Payer: Self-pay | Admitting: Nurse Practitioner

## 2024-06-09 MED ORDER — HYDROCODONE-ACETAMINOPHEN 7.5-325 MG PO TABS: 1.0000 | ORAL_TABLET | Freq: Two times a day (BID) | ORAL | 0 refills | Status: DC | PRN

## 2024-06-09 MED ORDER — TEMAZEPAM 15 MG PO CAPS: ORAL_CAPSULE | ORAL | 0 refills | Status: DC

## 2024-06-09 NOTE — Telephone Encounter (Signed)
 Name of Medication: Hydrocodone  Name of Pharmacy:  [facility] Last Written Date and Quantity: 05-07-24 #120  Last Office Visit and Type: 04-30-24 Next Office Visit and Type: 07-31-24 Last Controlled Substance Agreement Date: 04-30-24 Last UDS: 04-30-24   Temazepam  last filled 05-07-24 #60

## 2024-07-08 ENCOUNTER — Encounter: Payer: Self-pay | Admitting: Nurse Practitioner

## 2024-07-08 MED ORDER — HYDROCODONE-ACETAMINOPHEN 7.5-325 MG PO TABS: 1.0000 | ORAL_TABLET | Freq: Two times a day (BID) | ORAL | 0 refills | Status: DC | PRN

## 2024-07-08 MED ORDER — TEMAZEPAM 15 MG PO CAPS: ORAL_CAPSULE | ORAL | 0 refills | Status: DC

## 2024-07-31 ENCOUNTER — Ambulatory Visit: Admitting: Nurse Practitioner

## 2024-08-05 ENCOUNTER — Other Ambulatory Visit: Payer: Self-pay | Admitting: Medical Genetics

## 2024-08-05 DIAGNOSIS — Z006 Encounter for examination for normal comparison and control in clinical research program: Secondary | ICD-10-CM

## 2024-08-10 ENCOUNTER — Encounter: Payer: Self-pay | Admitting: Nurse Practitioner

## 2024-08-10 ENCOUNTER — Encounter: Payer: Self-pay | Admitting: Radiology

## 2024-08-11 ENCOUNTER — Other Ambulatory Visit: Payer: Self-pay | Admitting: Nurse Practitioner

## 2024-08-11 NOTE — Telephone Encounter (Unsigned)
 Copied from CRM [id number]. Topic: Clinical - Medication Refill >> Aug 11, 2024 10:27 AM Vena HERO wrote: Medication: HYDROcodone -acetaminophen  (NORCO) 7.5-325 MG tablet  Has the patient contacted their pharmacy? Yes (Agent: If no, request that the patient contact the pharmacy for the refill. If patient does not wish to contact the pharmacy document the reason why and proceed with request.) (Agent: If yes, when and what did the pharmacy advise?) to call provider  This is the patient's preferred pharmacy:  [facility] [id number] - [address] [place] - [address] [place] [address] Phone: [phone] Fax: [phone]  Is this the correct pharmacy for this prescription? Yes If no, delete pharmacy and type the correct one.   Has the prescription been filled recently? No  Is the patient out of the medication? No  Has the patient been seen for an appointment in the last year OR does the patient have an upcoming appointment? Yes  Can we respond through MyChart? Yes  Agent: Please be advised that Rx refills may take up to 3 business days. We ask that you follow-up with your pharmacy.

## 2024-08-12 MED ORDER — TEMAZEPAM 15 MG PO CAPS: ORAL_CAPSULE | ORAL | 0 refills | Status: DC

## 2024-08-12 MED ORDER — HYDROCODONE-ACETAMINOPHEN 7.5-325 MG PO TABS: 1.0000 | ORAL_TABLET | Freq: Two times a day (BID) | ORAL | 0 refills | Status: DC | PRN

## 2024-08-13 ENCOUNTER — Encounter: Payer: Self-pay | Admitting: Nurse Practitioner

## 2024-08-13 ENCOUNTER — Ambulatory Visit (INDEPENDENT_AMBULATORY_CARE_PROVIDER_SITE_OTHER): Admitting: Nurse Practitioner

## 2024-08-13 VITALS — BP 116/70 | HR 55 | Temp 98.0°F | Ht 65.0 in | Wt 138.8 lb

## 2024-08-13 DIAGNOSIS — K219 Gastro-esophageal reflux disease without esophagitis: Secondary | ICD-10-CM

## 2024-08-13 DIAGNOSIS — F411 Generalized anxiety disorder: Secondary | ICD-10-CM | POA: Diagnosis not present

## 2024-08-13 DIAGNOSIS — G479 Sleep disorder, unspecified: Secondary | ICD-10-CM

## 2024-08-13 DIAGNOSIS — Z23 Encounter for immunization: Secondary | ICD-10-CM | POA: Diagnosis not present

## 2024-08-13 DIAGNOSIS — F3341 Major depressive disorder, recurrent, in partial remission: Secondary | ICD-10-CM

## 2024-08-13 DIAGNOSIS — F1120 Opioid dependence, uncomplicated: Secondary | ICD-10-CM

## 2024-08-13 DIAGNOSIS — G8929 Other chronic pain: Secondary | ICD-10-CM

## 2024-08-13 DIAGNOSIS — H02401 Unspecified ptosis of right eyelid: Secondary | ICD-10-CM

## 2024-08-13 DIAGNOSIS — M545 Low back pain, unspecified: Secondary | ICD-10-CM

## 2024-08-13 DIAGNOSIS — F1320 Sedative, hypnotic or anxiolytic dependence, uncomplicated: Secondary | ICD-10-CM

## 2024-08-13 NOTE — Patient Instructions (Signed)
 Nice to see you today  I want to see you in 3 months, sooner if you need me I have referred you to plastic surgery. They should reach out to you in the next 2 weeks
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
## 2024-08-21 ENCOUNTER — Telehealth: Payer: Self-pay

## 2024-08-21 MED ORDER — CITALOPRAM HYDROBROMIDE 20 MG PO TABS: 20.0000 mg | ORAL_TABLET | Freq: Every day | ORAL | 3 refills | Status: DC

## 2024-08-21 NOTE — Telephone Encounter (Signed)
 LAST APPOINTMENT DATE: 08/13/2024   NEXT APPOINTMENT DATE: 11/13/2024  Citalopram  20 mg   LAST REFILL: 08/22/2023  QTY: 90 tablets, 3RF

## 2024-08-21 NOTE — Telephone Encounter (Signed)
 Refill provided

## 2024-08-21 NOTE — Addendum Note (Signed)
 Addended by: WENDEE LYNWOOD HERO on: 08/21/2024 09:54 AM   Modules accepted: Orders

## 2024-09-10 ENCOUNTER — Encounter: Payer: Self-pay | Admitting: Nurse Practitioner

## 2024-09-10 MED ORDER — HYDROCODONE-ACETAMINOPHEN 7.5-325 MG PO TABS: 1.0000 | ORAL_TABLET | Freq: Two times a day (BID) | ORAL | 0 refills | Status: DC | PRN

## 2024-09-10 MED ORDER — TEMAZEPAM 15 MG PO CAPS: ORAL_CAPSULE | ORAL | 0 refills | Status: DC

## 2024-09-22 ENCOUNTER — Telehealth: Payer: Self-pay

## 2024-09-22 DIAGNOSIS — H02401 Unspecified ptosis of right eyelid: Secondary | ICD-10-CM

## 2024-09-22 NOTE — Addendum Note (Signed)
 Addended by: WENDEE LYNWOOD HERO on: 09/22/2024 12:15 PM   Modules accepted: Orders

## 2024-09-22 NOTE — Telephone Encounter (Signed)
 Contacted and spoke with patient Informed her of referral placed for Dr. Lucillie.  No questions or concerns.

## 2024-09-22 NOTE — Telephone Encounter (Signed)
 Referral for Dr. Lucillie placed

## 2024-09-22 NOTE — Telephone Encounter (Signed)
 Copied from CRM #[id number]. Topic: Clinical - Medical Advice >> Sep 22, 2024 10:02 AM Nessti S wrote: Reason for CRM: beth called because dr evaline does not do the ptosis surgery. and recommends dr lucillie at [facility] [phone]

## 2024-10-09 ENCOUNTER — Encounter: Payer: Self-pay | Admitting: Nurse Practitioner

## 2024-10-09 DIAGNOSIS — Z20828 Contact with and (suspected) exposure to other viral communicable diseases: Secondary | ICD-10-CM

## 2024-10-09 MED ORDER — OSELTAMIVIR PHOSPHATE 75 MG PO CAPS: 75.0000 mg | ORAL_CAPSULE | Freq: Every day | ORAL | 0 refills | Status: AC

## 2024-10-12 MED ORDER — HYDROCODONE-ACETAMINOPHEN 7.5-325 MG PO TABS: 1.0000 | ORAL_TABLET | Freq: Two times a day (BID) | ORAL | 0 refills | Status: DC | PRN

## 2024-10-12 MED ORDER — TEMAZEPAM 15 MG PO CAPS: ORAL_CAPSULE | ORAL | 0 refills | Status: DC

## 2024-11-11 ENCOUNTER — Encounter: Payer: Self-pay | Admitting: Nurse Practitioner

## 2024-11-12 MED ORDER — TEMAZEPAM 15 MG PO CAPS: ORAL_CAPSULE | ORAL | 0 refills | Status: AC

## 2024-11-12 MED ORDER — HYDROCODONE-ACETAMINOPHEN 7.5-325 MG PO TABS: 1.0000 | ORAL_TABLET | Freq: Two times a day (BID) | ORAL | 0 refills | Status: AC | PRN

## 2024-11-13 ENCOUNTER — Ambulatory Visit: Admitting: Nurse Practitioner

## 2024-11-13 VITALS — BP 102/60 | HR 68 | Temp 98.1°F | Ht 65.0 in | Wt 138.0 lb

## 2024-11-13 DIAGNOSIS — G8929 Other chronic pain: Secondary | ICD-10-CM

## 2024-11-13 DIAGNOSIS — F1120 Opioid dependence, uncomplicated: Secondary | ICD-10-CM

## 2024-11-13 DIAGNOSIS — F3341 Major depressive disorder, recurrent, in partial remission: Secondary | ICD-10-CM

## 2024-11-13 DIAGNOSIS — F411 Generalized anxiety disorder: Secondary | ICD-10-CM

## 2024-11-13 DIAGNOSIS — F1320 Sedative, hypnotic or anxiolytic dependence, uncomplicated: Secondary | ICD-10-CM

## 2024-11-13 MED ORDER — SERTRALINE HCL 50 MG PO TABS: 50.0000 mg | ORAL_TABLET | Freq: Every day | ORAL | 0 refills | Status: AC

## 2024-11-13 NOTE — Patient Instructions (Signed)
 Nice to see you today  I will see you in 3 months, sooner if you need me  We will switch the citalopram  (celexa ) for the sertraline  (Zoloft ) 1:1

## 2024-11-13 NOTE — Progress Notes (Signed)
 "  Established Patient Office Visit  Subjective   Patient ID: Rachel Vaughn, female    DOB: [birth year]  Age: 64 y.o. MRN: [id number]  Chief Complaint  Patient presents with   Follow-up    Pt complains of need to discuss medications. States that her anxiety and depression has gotten worse. Pt states she is tired of not being able to leave the house     HPI  Discussed the use of AI scribe software for clinical note transcription with the patient, who gave verbal consent to proceed.  History of Present Illness Rachel Vaughn is a 64 year old female with anxiety and depression who presents with worsening symptoms and difficulty leaving the house.  She experiences severe anxiety and depression, significantly impacting her daily life. Her anxiety is described as 'through the roof,' making it nearly impossible to leave the house. She finds some relief in maintaining a routine, such as sitting and parking in the same place at a [employer]. However, unexpected situations, like a recent trade show visit with a friend, can trigger severe panic attacks, including dizziness and fainting spells.  She has been on citalopram  for nearly twenty years, which she describes as 'okay' but not optimal. She is currently taking citalopram  20 mg and is concerned about potential side effects. She also takes buspirone  10 mg, which she has been taking once daily despite being prescribed twice daily. She reports no side effects from buspirone . Additionally, she is on hydrocodone  and temazepam , which were recently refilled.  She is on disability due to her mental health condition and wants to regain her life, including returning to activities like going to the gym. She is concerned about her physical health due to inactivity. Her sleep is managed well with temazepam , and she does not experience any side effects from it.  No thoughts of self-harm or harm to others, hallucinations, fever, chills, chest pain, or  shortness of breath outside of panic episodes. Bowel movements are regular, though she sometimes experiences loose stools, which she attributes to dietary changes such as milk consumption.     Review of Systems  Constitutional:  Negative for chills and fever.  Respiratory:  Negative for shortness of breath.   Cardiovascular:  Negative for chest pain.  Psychiatric/Behavioral:  Negative for hallucinations and suicidal ideas.       Objective:     BP 102/60   Pulse 68   Temp 98.1 F (36.7 C) (Oral)   Ht 5' 5 (1.651 m)   Wt 138 lb (62.6 kg)   LMP 11/27/2016   SpO2 98%   BMI 22.96 kg/m    Physical Exam Vitals and nursing note reviewed.  Constitutional:      Appearance: Normal appearance.  Cardiovascular:     Rate and Rhythm: Normal rate and regular rhythm.     Heart sounds: Normal heart sounds.  Pulmonary:     Effort: Pulmonary effort is normal.     Breath sounds: Normal breath sounds.  Abdominal:     General: Bowel sounds are normal.  Neurological:     Mental Status: She is alert.      No results found for any visits on 11/13/24.    The 10-year ASCVD risk score (Arnett DK, et al., 2019) is: 3.3%    Assessment & Plan:   Problem List Items Addressed This Visit       Other   GAD (generalized anxiety disorder) - Primary   Relevant Medications   sertraline  (ZOLOFT )  50 MG tablet   Chronic low back pain   Relevant Medications   sertraline  (ZOLOFT ) 50 MG tablet   MDD (major depressive disorder), recurrent, in partial remission   Relevant Medications   sertraline  (ZOLOFT ) 50 MG tablet   Narcotic dependence (HCC)   Benzodiazepine dependence (HCC)   Assessment and Plan Assessment & Plan Generalized anxiety disorder and depression Severe anxiety and depression with panic attacks. Current citalopram  treatment suboptimal. Sertraline  considered for its efficacy and side effect profile. Buspirone  continued due to tolerability. - Discontinued citalopram . -  Initiated sertraline  with a one-to-one swap from citalopram . - Continue buspirone  10 mg twice daily. - Monitor for side effects such as headache and diarrhea. - Advised to take sertraline  in the morning to monitor side effects. - Scheduled follow-up in 12 weeks to assess response to medication change. She can reach out on mychart sooner if she needs to  Insomnia Managed with temazepam , effective for sleep. No issues reported with sleep quality or duration. - Continue temazepam  as prescribed.  Chronic pain -managed on hydrocodone , continue  Return in about 3 months (around 02/10/2025) for Chronic pain/GAD/MDD.    Adina Crandall, NP  "

## 2024-12-29 ENCOUNTER — Ambulatory Visit

## 2025-02-10 ENCOUNTER — Ambulatory Visit: Admitting: Nurse Practitioner
